# Patient Record
Sex: Female | Born: 1970 | Race: Black or African American | Hispanic: No | Marital: Single | Smoking: Current some day smoker
Health system: Southern US, Community
[De-identification: ages and names within clinical notes are randomized; demographics above are authoritative.]

## PROBLEM LIST (undated history)

## (undated) ENCOUNTER — Emergency Department (HOSPITAL_COMMUNITY): Admission: EM | Payer: BLUE CROSS/BLUE SHIELD | Source: Home / Self Care

## (undated) ENCOUNTER — Ambulatory Visit: Admission: EM | Payer: 59

## (undated) DIAGNOSIS — G5601 Carpal tunnel syndrome, right upper limb: Secondary | ICD-10-CM

## (undated) DIAGNOSIS — IMO0001 Reserved for inherently not codable concepts without codable children: Secondary | ICD-10-CM

## (undated) DIAGNOSIS — F1010 Alcohol abuse, uncomplicated: Secondary | ICD-10-CM

## (undated) DIAGNOSIS — I10 Essential (primary) hypertension: Secondary | ICD-10-CM

## (undated) DIAGNOSIS — F329 Major depressive disorder, single episode, unspecified: Secondary | ICD-10-CM

## (undated) DIAGNOSIS — F32A Depression, unspecified: Secondary | ICD-10-CM

## (undated) DIAGNOSIS — M1990 Unspecified osteoarthritis, unspecified site: Secondary | ICD-10-CM

## (undated) DIAGNOSIS — K219 Gastro-esophageal reflux disease without esophagitis: Secondary | ICD-10-CM

## (undated) DIAGNOSIS — T7840XA Allergy, unspecified, initial encounter: Secondary | ICD-10-CM

## (undated) DIAGNOSIS — F419 Anxiety disorder, unspecified: Secondary | ICD-10-CM

## (undated) HISTORY — PX: FINGER SURGERY: SHX640

## (undated) HISTORY — DX: Unspecified osteoarthritis, unspecified site: M19.90

## (undated) HISTORY — PX: COLONOSCOPY: SHX174

## (undated) HISTORY — PX: HAND TENDON SURGERY: SHX663

## (undated) HISTORY — DX: Allergy, unspecified, initial encounter: T78.40XA

## (undated) HISTORY — PX: UPPER GASTROINTESTINAL ENDOSCOPY: SHX188

---

## 1998-11-28 ENCOUNTER — Emergency Department (HOSPITAL_COMMUNITY): Admission: EM | Admit: 1998-11-28 | Discharge: 1998-11-28 | Payer: Self-pay

## 2001-09-22 ENCOUNTER — Emergency Department (HOSPITAL_COMMUNITY): Admission: EM | Admit: 2001-09-22 | Discharge: 2001-09-22 | Payer: Self-pay | Admitting: Emergency Medicine

## 2001-09-22 ENCOUNTER — Encounter: Payer: Self-pay | Admitting: Emergency Medicine

## 2003-10-05 ENCOUNTER — Emergency Department (HOSPITAL_COMMUNITY): Admission: AD | Admit: 2003-10-05 | Discharge: 2003-10-06 | Payer: Self-pay | Admitting: Emergency Medicine

## 2003-10-06 ENCOUNTER — Emergency Department (HOSPITAL_COMMUNITY): Admission: EM | Admit: 2003-10-06 | Discharge: 2003-10-06 | Payer: Self-pay | Admitting: Emergency Medicine

## 2003-10-11 ENCOUNTER — Inpatient Hospital Stay (HOSPITAL_COMMUNITY): Admission: EM | Admit: 2003-10-11 | Discharge: 2003-10-14 | Payer: Self-pay | Admitting: Emergency Medicine

## 2005-05-17 ENCOUNTER — Emergency Department (HOSPITAL_COMMUNITY): Admission: EM | Admit: 2005-05-17 | Discharge: 2005-05-18 | Payer: Self-pay | Admitting: Emergency Medicine

## 2012-05-13 ENCOUNTER — Emergency Department (HOSPITAL_COMMUNITY)
Admission: EM | Admit: 2012-05-13 | Discharge: 2012-05-13 | Disposition: A | Discharge status: Home / Self Care | Payer: Self-pay | Attending: Emergency Medicine | Admitting: Emergency Medicine

## 2012-05-13 ENCOUNTER — Encounter (HOSPITAL_COMMUNITY): Payer: Self-pay | Admitting: Emergency Medicine

## 2012-05-13 DIAGNOSIS — K029 Dental caries, unspecified: Secondary | ICD-10-CM | POA: Insufficient documentation

## 2012-05-13 DIAGNOSIS — K0889 Other specified disorders of teeth and supporting structures: Secondary | ICD-10-CM

## 2012-05-13 DIAGNOSIS — F17200 Nicotine dependence, unspecified, uncomplicated: Secondary | ICD-10-CM | POA: Insufficient documentation

## 2012-05-13 MED ORDER — IBUPROFEN 400 MG PO TABS
400.0000 mg | ORAL_TABLET | Freq: Once | ORAL | Status: AC
  Administered 2012-05-13: 400 mg via ORAL
  Filled 2012-05-13: qty 1

## 2012-05-13 MED ORDER — NAPROXEN 250 MG PO TABS: 250.0000 mg | ORAL_TABLET | Freq: Two times a day (BID) | ORAL | Status: DC

## 2012-05-13 MED ORDER — OXYCODONE-ACETAMINOPHEN 5-325 MG PO TABS
1.0000 | ORAL_TABLET | Freq: Once | ORAL | Status: AC
  Administered 2012-05-13: 1 via ORAL
  Filled 2012-05-13: qty 1

## 2012-05-13 MED ORDER — OXYCODONE-ACETAMINOPHEN 5-325 MG PO TABS: ORAL_TABLET | ORAL | Status: AC

## 2012-05-13 MED ORDER — PENICILLIN V POTASSIUM 250 MG PO TABS: 250.0000 mg | ORAL_TABLET | Freq: Four times a day (QID) | ORAL | Status: AC

## 2012-05-13 NOTE — ED Provider Notes (Signed)
History     CSN: [id number]  Arrival date & time 05/13/12  1818   First MD Initiated Contact with Patient 05/13/12 1909      Chief Complaint  Patient presents with  . Dental Pain     HPI Pt was seen at 1910.  Per pt, c/o gradual onset and persistence of constant right upper teeth "pain" for the past 2 weeks. Denies fevers, no intra-oral edema, no rash, no facial swelling, no dysphagia, no neck pain.   The condition is aggravated by nothing. The condition is relieved by nothing. The symptoms have been associated with no other complaints. The patient has no significant history of serious medical conditions.     History reviewed. No pertinent past medical history.  History reviewed. No pertinent past surgical history.   History  Substance Use Topics  . Smoking status: Current Everyday Smoker  . Smokeless tobacco: Not on file  . Alcohol Use: Yes    Review of Systems ROS: Statement: All systems negative except as marked or noted in the HPI; Constitutional: Negative for fever and chills. ; ; Eyes: Negative for eye pain and discharge. ; ; ENMT: Positive for dental caries, dental hygiene poor and toothache. Negative for ear pain, bleeding gums, dental injury, facial deformity, facial swelling, hoarseness, nasal congestion, sinus pressure, sore throat, throat swelling and tongue swollen. ; ; Cardiovascular: Negative for chest pain, palpitations, diaphoresis, dyspnea and peripheral edema. ; ; Respiratory: Negative for cough, wheezing and stridor. ; ; Gastrointestinal: Negative for nausea, vomiting, diarrhea and abdominal pain. ; ; Genitourinary: Negative for dysuria, flank pain and hematuria. ; ; Musculoskeletal: Negative for back pain and neck pain. ; ; Skin: Negative for rash and skin lesion. ; ; Neuro: Negative for headache, lightheadedness and neck stiffness. ;    Allergies  Review of patient's allergies indicates no known allergies.  Home Medications  No current outpatient  prescriptions on file.  BP 160/109  Pulse 120  Temp 98.7 F (37.1 C) (Oral)  Resp 18  SpO2 99%  Physical Exam 1915: Physical examination: Vital signs and O2 SAT: Reviewed; Constitutional: Well developed, Well nourished, Well hydrated, In no acute distress; Head and Face: Normocephalic, Atraumatic; Eyes: EOMI, PERRL, No scleral icterus; ENMT: Mouth and pharynx normal, Poor dentition, Widespread dental decay, Left TM normal, Right TM normal, Mucous membranes moist, +upper right 1st premolar and 2nd molar with extensive dental decay.  No gingival erythema, edema, fluctuance, or drainage.  No hoarse voice, no drooling, no stridor.  ; Neck: Supple, Full range of motion, No lymphadenopathy; Cardiovascular: Regular rate and rhythm, No murmur, rub, or gallop; Respiratory: Breath sounds clear & equal bilaterally, No rales, rhonchi, wheezes, or rub, Normal respiratory effort/excursion; Chest: Nontender, Movement normal; Extremities: Pulses normal, No tenderness, No edema; Neuro: AA&Ox3, Major CN grossly intact.  No gross focal motor or sensory deficits in extremities.; Skin: Color normal, No rash, No petechiae, Warm, Dry   ED Course  Procedures    MDM  MDM Reviewed: nursing note and vitals     1915:  Pt encouraged to f/u with dentist or oral surgeon for her dental needs for good continuity of care and definitive treatment.  Verb understanding.         Laray Anger, DO 05/15/12 1245

## 2012-05-13 NOTE — ED Notes (Signed)
Pt c/o right sided dental pain x 5 days

## 2012-06-30 ENCOUNTER — Emergency Department (HOSPITAL_COMMUNITY)
Admission: EM | Admit: 2012-06-30 | Discharge: 2012-06-30 | Disposition: A | Discharge status: Home / Self Care | Payer: Self-pay | Attending: Emergency Medicine | Admitting: Emergency Medicine

## 2012-06-30 ENCOUNTER — Emergency Department (HOSPITAL_COMMUNITY): Payer: Self-pay

## 2012-06-30 ENCOUNTER — Encounter (HOSPITAL_COMMUNITY): Payer: Self-pay | Admitting: *Deleted

## 2012-06-30 DIAGNOSIS — F17200 Nicotine dependence, unspecified, uncomplicated: Secondary | ICD-10-CM | POA: Insufficient documentation

## 2012-06-30 DIAGNOSIS — M79673 Pain in unspecified foot: Secondary | ICD-10-CM

## 2012-06-30 DIAGNOSIS — M79609 Pain in unspecified limb: Secondary | ICD-10-CM | POA: Insufficient documentation

## 2012-06-30 MED ORDER — IBUPROFEN 400 MG PO TABS
600.0000 mg | ORAL_TABLET | Freq: Once | ORAL | Status: AC
  Administered 2012-06-30: 600 mg via ORAL
  Filled 2012-06-30: qty 1

## 2012-06-30 NOTE — ED Notes (Signed)
Pt to radiology.

## 2012-06-30 NOTE — ED Provider Notes (Signed)
History     CSN: [id number]  Arrival date & time 06/30/12  1451   First MD Initiated Contact with Patient 06/30/12 1503      Chief Complaint  Patient presents with  . Foot Pain    (Consider location/radiation/quality/duration/timing/severity/associated sxs/prior treatment) Patient is a 41 y.o. female presenting with lower extremity pain. The history is provided by the patient.  Foot Pain Pertinent negatives include no shortness of breath.  pt c/o pain to dorsal aspect mid right foot on and off x 1 month. Dull, intermittent, non radiating. Occasionally worse w walking and palpation dorsum foot.  No acute or abrupt change today. Denies known injury. Skin intact. No erythema or skin lesions. No discoloration of foot.  No numbness/weakness. No calf pain or claudication. Denies hx arthritis or gout.     History reviewed. No pertinent past medical history.  History reviewed. No pertinent past surgical history.  No family history on file.  History  Substance Use Topics  . Smoking status: Current Every Day Smoker  . Smokeless tobacco: Not on file  . Alcohol Use: Yes    OB History    Grav Para Term Preterm Abortions TAB SAB Ect Mult Living                  Review of Systems  Constitutional: Negative for fever and chills.  Respiratory: Negative for shortness of breath.   Cardiovascular: Negative for leg swelling.  Musculoskeletal: Negative for gait problem.  Skin: Negative for wound.  Neurological: Negative for weakness and numbness.    Allergies  Review of patient's allergies indicates no known allergies.  Home Medications   Current Outpatient Rx  Name Route Sig Dispense Refill  . NAPROXEN 250 MG PO TABS Oral Take 250 mg by mouth 2 (two) times daily with a meal.    . QUETIAPINE FUMARATE 50 MG PO TABS Oral Take 150 mg by mouth at bedtime.      BP 129/100  Pulse 92  Temp 97.5 F (36.4 C) (Oral)  Resp 16  SpO2 99%  LMP 06/29/2012  Physical Exam  Nursing note  and vitals reviewed. Constitutional: She appears well-developed and well-nourished. No distress.  HENT:  Nose: Nose normal.  Mouth/Throat: Oropharynx is clear and moist.  Eyes: Conjunctivae normal are normal. No scleral icterus.  Neck: Neck supple. No tracheal deviation present.  Cardiovascular: Normal rate and intact distal pulses.   Pulmonary/Chest: Effort normal. No respiratory distress.  Abdominal: Normal appearance. She exhibits no distension.  Musculoskeletal: She exhibits no edema.       Mild tenderness dorsum right foot. No sts. No skin changes, lesions or erythema. Normal temperature, no increased warmth. Dp/pt 2+, normal cap refill distally.   Neurological: She is alert.       Motor intact bil. Steady gait.   Skin: Skin is warm and dry. No rash noted.  Psychiatric: She has a normal mood and affect.    ED Course  Procedures (including critical care time)  Labs Reviewed - No data to display Dg Foot Complete Right  06/30/2012  *RADIOLOGY REPORT*  Clinical Data: Twisting injury with dorsal pain.  Difficulty bearing weight.  RIGHT FOOT COMPLETE - 3+ VIEW  Comparison: None.  Findings: No acute osseous or joint abnormality.  IMPRESSION: No acute osseous or joint abnormality.   Original Report Authenticated By: Reyes Ivan, M.D.         MDM  No sign of infection. xrays from triage neg.  Discussed diff dx w  pt. Motrin po.  Will have f/u podiatry.   Reviewed nursing notes and prior charts for additional history.           Suzi Roots, MD 06/30/12 [phone]

## 2012-06-30 NOTE — ED Notes (Signed)
Pt is here for foot pain on the top of her foot which she has had for a month.  Pt does not recall any trauma but states that she drinks and it may have gotten injured without her remembering

## 2012-08-31 ENCOUNTER — Emergency Department (HOSPITAL_COMMUNITY)
Admission: EM | Admit: 2012-08-31 | Discharge: 2012-08-31 | Discharge status: Against Medical Advice | Payer: Managed Care, Other (non HMO) | Attending: Emergency Medicine | Admitting: Emergency Medicine

## 2012-08-31 ENCOUNTER — Emergency Department (HOSPITAL_COMMUNITY)
Admission: EM | Admit: 2012-08-31 | Discharge: 2012-09-01 | Disposition: A | Discharge status: Home / Self Care | Payer: Self-pay | Attending: Emergency Medicine | Admitting: Emergency Medicine

## 2012-08-31 ENCOUNTER — Encounter (HOSPITAL_COMMUNITY): Payer: Self-pay | Admitting: Emergency Medicine

## 2012-08-31 DIAGNOSIS — F17200 Nicotine dependence, unspecified, uncomplicated: Secondary | ICD-10-CM | POA: Insufficient documentation

## 2012-08-31 DIAGNOSIS — F1010 Alcohol abuse, uncomplicated: Secondary | ICD-10-CM | POA: Insufficient documentation

## 2012-08-31 DIAGNOSIS — F10929 Alcohol use, unspecified with intoxication, unspecified: Secondary | ICD-10-CM

## 2012-08-31 LAB — CBC WITH DIFFERENTIAL/PLATELET
Eosinophils Relative: 0 % (ref 0–5)
HCT: 36.2 % (ref 36.0–46.0)
Lymphocytes Relative: 14 % (ref 12–46)
Lymphs Abs: 1.1 10*3/uL (ref 0.7–4.0)
MCV: 89.4 fL (ref 78.0–100.0)
Monocytes Absolute: 0.2 10*3/uL (ref 0.1–1.0)
Neutro Abs: 6.6 10*3/uL (ref 1.7–7.7)
RBC: 4.05 MIL/uL (ref 3.87–5.11)
WBC: 7.9 10*3/uL (ref 4.0–10.5)

## 2012-08-31 LAB — BASIC METABOLIC PANEL
CO2: 23 mEq/L (ref 19–32)
Chloride: 105 mEq/L (ref 96–112)
Creatinine, Ser: 0.75 mg/dL (ref 0.50–1.10)
Potassium: 3.7 mEq/L (ref 3.5–5.1)

## 2012-08-31 LAB — ETHANOL: Alcohol, Ethyl (B): 224 mg/dL — ABNORMAL HIGH (ref 0–11)

## 2012-08-31 NOTE — ED Notes (Signed)
[id number]<AV> Expected date:<BR> Expected time:<BR> Means of arrival:<BR> Comments:<BR> Medic 261, 41 F, ETOH

## 2012-08-31 NOTE — ED Notes (Signed)
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

## 2012-08-31 NOTE — ED Notes (Signed)
Patient states she was laying in the rain drinking. Patient cold to touch. Rectal temp 95.7. Warm blankets applied. Patient statesshe had 3 beers and 1/2 pint liquor today. Patient states she was recently tested for HIV and Syphilis, both were negative. LMP @ 3weeks ago. 20g to L AC, SL.

## 2012-08-31 NOTE — ED Notes (Signed)
Patient arrived via EMS at 26. Triage assessment completed at 2002 by [facility], RN. Patient had given in accuarte birthday information on arrival.

## 2012-08-31 NOTE — ED Notes (Signed)
Per EMS. Patient laying on sidewalk for at least 1 hour, witnesses saw her laying there and when EMS arrived it had been greater than 1 hour. When asked about amount ingested she says "alot". Patient denied any pain on ride here, upon arrival patient states her heart hurts. Patient denies any medical history.

## 2012-08-31 NOTE — ED Notes (Addendum)
Per EMS. Patient reported to be laying in the rain greater than 1 hour, according to witnesses on scene. When questioned patient states shes' been drinking "alot" or "not enough". 20g L AC SL. Denies pain and any medical history.

## 2012-08-31 NOTE — ED Provider Notes (Signed)
History     CSN: [id number]  Arrival date & time 08/31/12  2124   First MD Initiated Contact with Patient 08/31/12 2158      Chief Complaint  Patient presents with  . Alcohol Intoxication   level V caveat applies secondary to alcohol intoxication  HPI  History provided by patient and EMS report. Patient is a 41 year old female with no known significant PMH who presents by EMS with concerns for alcohol intoxication. Patient is a poor historian. Per EMS patient was picked up lying on the sidewalk apparently intoxicated. Patient has been laying in the rain and was very wet. Patient had reported drinking a large amount of alcohol today. No complaints of injury. No physical signs of injury reported.   History reviewed. No pertinent past medical history.  History reviewed. No pertinent past surgical history.  History reviewed. No pertinent family history.  History  Substance Use Topics  . Smoking status: Current Every Day Smoker  . Smokeless tobacco: Not on file  . Alcohol Use: Yes     Comment: patient unableto quantify    OB History    Grav Para Term Preterm Abortions TAB SAB Ect Mult Living                  Review of Systems  Unable to perform ROS: Other    Allergies  Review of patient's allergies indicates no known allergies.  Home Medications   Current Outpatient Rx  Name  Route  Sig  Dispense  Refill  . NAPROXEN 250 MG PO TABS   Oral   Take 250 mg by mouth 2 (two) times daily with a meal.         . QUETIAPINE FUMARATE 50 MG PO TABS   Oral   Take 150 mg by mouth at bedtime.           BP 130/73  Pulse 85  Temp 95.7 F (35.4 C) (Rectal)  Resp 12  SpO2 90%  LMP 08/10/2012  Physical Exam  Nursing note and vitals reviewed. Constitutional: She appears well-developed and well-nourished. No distress.  HENT:  Head: Normocephalic and atraumatic.  Neck: Normal range of motion. Neck supple.  Cardiovascular: Normal rate and regular rhythm.    Pulmonary/Chest: Effort normal and breath sounds normal. No respiratory distress. She has no wheezes. She has no rales.  Abdominal: Soft. There is no tenderness.  Neurological:       Patient arouses mildly to voice command partial eye-opening. She slurs her speech but answers questions.  Skin: Skin is warm and dry.    ED Course  Procedures   Results for orders placed during the hospital encounter of 08/31/12  CBC WITH DIFFERENTIAL      Component Value Range   WBC 7.9  4.0 - 10.5 K/uL   RBC 4.05  3.87 - 5.11 MIL/uL   Hemoglobin 12.4  12.0 - 15.0 g/dL   HCT 40.9  81.1 - 91.4 %   MCV 89.4  78.0 - 100.0 fL   MCH 30.6  26.0 - 34.0 pg   MCHC 34.3  30.0 - 36.0 g/dL   RDW 78.2  95.6 - 21.3 %   Platelets 295  150 - 400 K/uL   Neutrophils Relative 83 (*) 43 - 77 %   Neutro Abs 6.6  1.7 - 7.7 K/uL   Lymphocytes Relative 14  12 - 46 %   Lymphs Abs 1.1  0.7 - 4.0 K/uL   Monocytes Relative 2 (*) 3 - 12 %  Monocytes Absolute 0.2  0.1 - 1.0 K/uL   Eosinophils Relative 0  0 - 5 %   Eosinophils Absolute 0.0  0.0 - 0.7 K/uL   Basophils Relative 0  0 - 1 %   Basophils Absolute 0.0  0.0 - 0.1 K/uL  ETHANOL      Component Value Range   Alcohol, Ethyl (B) 224 (*) 0 - 11 mg/dL  BASIC METABOLIC PANEL      Component Value Range   Sodium 140  135 - 145 mEq/L   Potassium 3.7  3.5 - 5.1 mEq/L   Chloride 105  96 - 112 mEq/L   CO2 23  19 - 32 mEq/L   Glucose, Bld 113 (*) 70 - 99 mg/dL   BUN 8  6 - 23 mg/dL   Creatinine, Ser 0.45  0.50 - 1.10 mg/dL   Calcium 9.1  8.4 - 40.9 mg/dL   GFR calc non Af Amer >90  >90 mL/min   GFR calc Af Amer >90  >90 mL/min        1. Alcohol intoxication       MDM  8:20 PM patient seen and evaluated. Patient sleeping does arouse to voice command slightly. Patient smells of alcohol. She does answer questions and states she has been drinking. Patient denies any other complaints aside from complaining of being cold.   Patient has been observed in the  emergency department. Labs demonstrated elevated alcohol level. No other concerning findings. Patient has been resting comfortably. Temperature improved with warming measures. Patient has since been much more awake. She has been asking for food and drinks. Patient also requesting to return home. Patient denies any other additional complaints.     Angus Seller, [place] 09/01/12 [phone]

## 2012-08-31 NOTE — ED Notes (Signed)
Patient intoxicated, difficult to obtain history. Patient states she was laying in the rain, drinking, patient states she had 3 beers and half a pint of liquor. Patient cooperative. Patient rectal temp 95.44F, multiple warm blankets applied. Patient states LMP to be 3 weeks ago, says she recently was tested for HIV and syphilis and was negative. 20g to L AC SL, placed by EMS.

## 2012-08-31 NOTE — ED Notes (Signed)
Patient demographic information entered incorrectly due to patient giving inaccurate information. Please disreguard information entered into this chart for 08/31/12 visit.

## 2012-09-01 NOTE — ED Notes (Signed)
Patient states she feels "weird" non specific, reports headache 8/10. Patient states she drinks a lot and does not typically have a hangover. Patient states she does not remember anything from last night except people waking her up and telling her that she had been laying there for an hour, and that she felt cold. VSS at this time.

## 2012-09-01 NOTE — ED Notes (Signed)
Patient awake, talking on the phone. Patient appears more oriented at this time.

## 2012-09-03 NOTE — ED Provider Notes (Signed)
Medical screening examination/treatment/procedure(s) were performed by non-physician practitioner and as supervising physician I was immediately available for consultation/collaboration.   Tatem Holsonback H Gayatri Teasdale, MD 09/03/12 0650 

## 2015-11-10 ENCOUNTER — Encounter (HOSPITAL_COMMUNITY): Payer: Self-pay | Admitting: Emergency Medicine

## 2015-11-10 ENCOUNTER — Emergency Department (HOSPITAL_COMMUNITY)
Admission: EM | Admit: 2015-11-10 | Discharge: 2015-11-10 | Disposition: A | Discharge status: Home / Self Care | Payer: BLUE CROSS/BLUE SHIELD | Attending: Emergency Medicine | Admitting: Emergency Medicine

## 2015-11-10 DIAGNOSIS — J40 Bronchitis, not specified as acute or chronic: Secondary | ICD-10-CM

## 2015-11-10 DIAGNOSIS — J069 Acute upper respiratory infection, unspecified: Secondary | ICD-10-CM | POA: Insufficient documentation

## 2015-11-10 DIAGNOSIS — J209 Acute bronchitis, unspecified: Secondary | ICD-10-CM | POA: Insufficient documentation

## 2015-11-10 DIAGNOSIS — F17210 Nicotine dependence, cigarettes, uncomplicated: Secondary | ICD-10-CM | POA: Insufficient documentation

## 2015-11-10 DIAGNOSIS — R52 Pain, unspecified: Secondary | ICD-10-CM | POA: Insufficient documentation

## 2015-11-10 DIAGNOSIS — R05 Cough: Secondary | ICD-10-CM | POA: Diagnosis present

## 2015-11-10 MED ORDER — ALBUTEROL SULFATE HFA 108 (90 BASE) MCG/ACT IN AERS
2.0000 | INHALATION_SPRAY | Freq: Once | RESPIRATORY_TRACT | Status: AC
  Administered 2015-11-10: 2 via RESPIRATORY_TRACT
  Filled 2015-11-10: qty 6.7

## 2015-11-10 MED ORDER — IBUPROFEN 400 MG PO TABS
800.0000 mg | ORAL_TABLET | Freq: Once | ORAL | Status: AC
  Administered 2015-11-10: 800 mg via ORAL
  Filled 2015-11-10: qty 4

## 2015-11-10 MED ORDER — PREDNISONE 20 MG PO TABS
60.0000 mg | ORAL_TABLET | Freq: Once | ORAL | Status: AC
  Administered 2015-11-10: 60 mg via ORAL
  Filled 2015-11-10: qty 3

## 2015-11-10 MED ORDER — IPRATROPIUM-ALBUTEROL 0.5-2.5 (3) MG/3ML IN SOLN
3.0000 mL | Freq: Once | RESPIRATORY_TRACT | Status: AC
  Administered 2015-11-10: 3 mL via RESPIRATORY_TRACT
  Filled 2015-11-10: qty 3

## 2015-11-10 MED ORDER — GUAIFENESIN-CODEINE 100-10 MG/5ML PO SOLN: 5.0000 mL | Freq: Three times a day (TID) | ORAL | Status: DC | PRN

## 2015-11-10 MED ORDER — BENZONATATE 100 MG PO CAPS
200.0000 mg | ORAL_CAPSULE | Freq: Once | ORAL | Status: AC
  Administered 2015-11-10: 200 mg via ORAL
  Filled 2015-11-10: qty 2

## 2015-11-10 MED ORDER — ALBUTEROL SULFATE HFA 108 (90 BASE) MCG/ACT IN AERS: 1.0000 | INHALATION_SPRAY | Freq: Four times a day (QID) | RESPIRATORY_TRACT | Status: DC | PRN

## 2015-11-10 MED ORDER — PREDNISONE 20 MG PO TABS: 40.0000 mg | ORAL_TABLET | Freq: Every day | ORAL | Status: DC

## 2015-11-10 MED ORDER — AZITHROMYCIN 250 MG PO TABS: 250.0000 mg | ORAL_TABLET | Freq: Every day | ORAL | Status: DC

## 2015-11-10 NOTE — Discharge Instructions (Signed)
Acute Bronchitis  Bronchitis is inflammation of the airways that extend from the windpipe into the lungs (bronchi). The inflammation often causes mucus to develop. This leads to a cough, which is the most common symptom of bronchitis.  In acute bronchitis, the condition usually develops suddenly and goes away over time, usually in a couple weeks. Smoking, allergies, and asthma can make bronchitis worse. Repeated episodes of bronchitis may cause further lung problems.  CAUSES Acute bronchitis is most often caused by the same virus that causes a cold. The virus can spread from person to person (contagious) through coughing, sneezing, and touching contaminated objects. SIGNS AND SYMPTOMS   Cough.   Fever.   Coughing up mucus.   Body aches.   Chest congestion.   Chills.   Shortness of breath.   Sore throat.  DIAGNOSIS  Acute bronchitis is usually diagnosed through a physical exam. Your health care provider will also ask you questions about your medical history. Tests, such as chest X-rays, are sometimes done to rule out other conditions.  TREATMENT  Acute bronchitis usually goes away in a couple weeks. Oftentimes, no medical treatment is necessary. Medicines are sometimes given for relief of fever or cough. Antibiotic medicines are usually not needed but may be prescribed in certain situations. In some cases, an inhaler may be recommended to help reduce shortness of breath and control the cough. A cool mist vaporizer may also be used to help thin bronchial secretions and make it easier to clear the chest.  HOME CARE INSTRUCTIONS  Get plenty of rest.   Drink enough fluids to keep your urine clear or pale yellow (unless you have a medical condition that requires fluid restriction). Increasing fluids may help thin your respiratory secretions (sputum) and reduce chest congestion, and it will prevent dehydration.   Take medicines only as directed by your health care provider.  If  you were prescribed an antibiotic medicine, finish it all even if you start to feel better.  Avoid smoking and secondhand smoke. Exposure to cigarette smoke or irritating chemicals will make bronchitis worse. If you are a smoker, consider using nicotine gum or skin patches to help control withdrawal symptoms. Quitting smoking will help your lungs heal faster.   Reduce the chances of another bout of acute bronchitis by washing your hands frequently, avoiding people with cold symptoms, and trying not to touch your hands to your mouth, nose, or eyes.   Keep all follow-up visits as directed by your health care provider.  SEEK MEDICAL CARE IF: Your symptoms do not improve after 1 week of treatment.  SEEK IMMEDIATE MEDICAL CARE IF:  You develop an increased fever or chills.   You have chest pain.   You have severe shortness of breath.  You have bloody sputum.   You develop dehydration.  You faint or repeatedly feel like you are going to pass out.  You develop repeated vomiting.  You develop a severe headache. MAKE SURE YOU:   Understand these instructions.  Will watch your condition.  Will get help right away if you are not doing well or get worse.   This information is not intended to replace advice given to you by your health care provider. Make sure you discuss any questions you have with your health care provider.   Document Released: 10/04/2004 Document Revised: 09/17/2014 Document Reviewed: 02/17/2013 Elsevier Interactive Patient Education 2016 [employer].   How to Use an Inhaler Proper inhaler technique is very important. Good technique ensures that the  medicine reaches the lungs. Poor technique results in depositing the medicine on the tongue and back of the throat rather than in the airways. If you do not use the inhaler with good technique, the medicine will not help you. STEPS TO FOLLOW IF USING AN INHALER WITHOUT AN EXTENSION TUBE  Remove the cap from the  inhaler.  If you are using the inhaler for the first time, you will need to prime it. Shake the inhaler for 5 seconds and release four puffs into the air, away from your face. Ask your health care provider or pharmacist if you have questions about priming your inhaler.  Shake the inhaler for 5 seconds before each breath in (inhalation).  Position the inhaler so that the top of the canister faces up.  Put your index finger on the top of the medicine canister. Your thumb supports the bottom of the inhaler.  Open your mouth.  Either place the inhaler between your teeth and place your lips tightly around the mouthpiece, or hold the inhaler 1-2 inches away from your open mouth. If you are unsure of which technique to use, ask your health care provider.  Breathe out (exhale) normally and as completely as possible.  Press the canister down with your index finger to release the medicine.  At the same time as the canister is pressed, inhale deeply and slowly until your lungs are completely filled. This should take 4-6 seconds. Keep your tongue down.  Hold the medicine in your lungs for 5-10 seconds (10 seconds is best). This helps the medicine get into the small airways of your lungs.  Breathe out slowly, through pursed lips. Whistling is an example of pursed lips.  Wait at least 15-30 seconds between puffs. Continue with the above steps until you have taken the number of puffs your health care provider has ordered. Do not use the inhaler more than your health care provider tells you.  Replace the cap on the inhaler.  Follow the directions from your health care provider or the inhaler insert for cleaning the inhaler. STEPS TO FOLLOW IF USING AN INHALER WITH AN EXTENSION (SPACER)  Remove the cap from the inhaler.  If you are using the inhaler for the first time, you will need to prime it. Shake the inhaler for 5 seconds and release four puffs into the air, away from your face. Ask your health  care provider or pharmacist if you have questions about priming your inhaler.  Shake the inhaler for 5 seconds before each breath in (inhalation).  Place the open end of the spacer onto the mouthpiece of the inhaler.  Position the inhaler so that the top of the canister faces up and the spacer mouthpiece faces you.  Put your index finger on the top of the medicine canister. Your thumb supports the bottom of the inhaler and the spacer.  Breathe out (exhale) normally and as completely as possible.  Immediately after exhaling, place the spacer between your teeth and into your mouth. Close your lips tightly around the spacer.  Press the canister down with your index finger to release the medicine.  At the same time as the canister is pressed, inhale deeply and slowly until your lungs are completely filled. This should take 4-6 seconds. Keep your tongue down and out of the way.  Hold the medicine in your lungs for 5-10 seconds (10 seconds is best). This helps the medicine get into the small airways of your lungs. Exhale.  Repeat inhaling deeply through  the spacer mouthpiece. Again hold that breath for up to 10 seconds (10 seconds is best). Exhale slowly. If it is difficult to take this second deep breath through the spacer, breathe normally several times through the spacer. Remove the spacer from your mouth.  Wait at least 15-30 seconds between puffs. Continue with the above steps until you have taken the number of puffs your health care provider has ordered. Do not use the inhaler more than your health care provider tells you.  Remove the spacer from the inhaler, and place the cap on the inhaler.  Follow the directions from your health care provider or the inhaler insert for cleaning the inhaler and spacer. If you are using different kinds of inhalers, use your quick relief medicine to open the airways 10-15 minutes before using a steroid if instructed to do so by your health care provider. If  you are unsure which inhalers to use and the order of using them, ask your health care provider, nurse, or respiratory therapist. If you are using a steroid inhaler, always rinse your mouth with water after your last puff, then gargle and spit out the water. Do not swallow the water. AVOID:  Inhaling before or after starting the spray of medicine. It takes practice to coordinate your breathing with triggering the spray.  Inhaling through the nose (rather than the mouth) when triggering the spray. HOW TO DETERMINE IF YOUR INHALER IS FULL OR NEARLY EMPTY You cannot know when an inhaler is empty by shaking it. A few inhalers are now being made with dose counters. Ask your health care provider for a prescription that has a dose counter if you feel you need that extra help. If your inhaler does not have a counter, ask your health care provider to help you determine the date you need to refill your inhaler. Write the refill date on a calendar or your inhaler canister. Refill your inhaler 7-10 days before it runs out. Be sure to keep an adequate supply of medicine. This includes making sure it is not expired, and that you have a spare inhaler.  SEEK MEDICAL CARE IF:   Your symptoms are only partially relieved with your inhaler.  You are having trouble using your inhaler.  You have some increase in phlegm. SEEK IMMEDIATE MEDICAL CARE IF:   You feel little or no relief with your inhalers. You are still wheezing and are feeling shortness of breath or tightness in your chest or both.  You have dizziness, headaches, or a fast heart rate.  You have chills, fever, or night sweats.  You have a noticeable increase in phlegm production, or there is blood in the phlegm. MAKE SURE YOU:   Understand these instructions.  Will watch your condition.  Will get help right away if you are not doing well or get worse.   This information is not intended to replace advice given to you by your health care  provider. Make sure you discuss any questions you have with your health care provider.   Document Released: 08/24/2000 Document Revised: 06/17/2013 Document Reviewed: 03/26/2013 Elsevier Interactive Patient Education 2016 [employer] Inc[employer]  Viral Infections A viral infection can be caused by different types of viruses.Most viral infections are not serious and resolve on their own. However, some infections may cause severe symptoms and may lead to further complications. SYMPTOMS Viruses can frequently cause:  Minor sore throat.  Aches and pains.  Headaches.  Runny nose.  Different types of rashes.  Watery eyes.  Tiredness.  Cough.  Loss of appetite.  Gastrointestinal infections, resulting in nausea, vomiting, and diarrhea. These symptoms do not respond to antibiotics because the infection is not caused by bacteria. However, you might catch a bacterial infection following the viral infection. This is sometimes called a "superinfection." Symptoms of such a bacterial infection may include:  Worsening sore throat with pus and difficulty swallowing.  Swollen neck glands.  Chills and a high or persistent fever.  Severe headache.  Tenderness over the sinuses.  Persistent overall ill feeling (malaise), muscle aches, and tiredness (fatigue).  Persistent cough.  Yellow, green, or brown mucus production with coughing. HOME CARE INSTRUCTIONS   Only take over-the-counter or prescription medicines for pain, discomfort, diarrhea, or fever as directed by your caregiver.  Drink enough water and fluids to keep your urine clear or pale yellow. Sports drinks can provide valuable electrolytes, sugars, and hydration.  Get plenty of rest and maintain proper nutrition. Soups and broths with crackers or rice are fine. SEEK IMMEDIATE MEDICAL CARE IF:   You have severe headaches, shortness of breath, chest pain, neck pain, or an unusual rash.  You have uncontrolled vomiting, diarrhea, or  you are unable to keep down fluids.  You or your child has an oral temperature above 102 F (38.9 C), not controlled by medicine.  Your baby is older than 3 months with a rectal temperature of 102 F (38.9 C) or higher.  Your baby is 58 months old or younger with a rectal temperature of 100.4 F (38 C) or higher. MAKE SURE YOU:   Understand these instructions.  Will watch your condition.  Will get help right away if you are not doing well or get worse.   This information is not intended to replace advice given to you by your health care provider. Make sure you discuss any questions you have with your health care provider.   Document Released: 06/06/2005 Document Revised: 11/19/2011 Document Reviewed: 02/02/2015 Elsevier Interactive Patient Education [employer]

## 2015-11-10 NOTE — ED Notes (Signed)
Declined W/C at D/C and was escorted to lobby by RN. 

## 2015-11-10 NOTE — ED Provider Notes (Signed)
CSN: [id number]     Arrival date & time 11/10/15  1412 History   First MD Initiated Contact with Patient 11/10/15 1458     Chief Complaint  Patient presents with  . Generalized Body Aches  . Cough     (Consider location/radiation/quality/duration/timing/severity/associated sxs/prior Treatment) HPI   Pt is a 45 y/o female, current smoker, who presents to the ED for evaluation of 2 weeks of persistent URI sx with productive cough and intermittent body aches.  She has tried alkaselzter cold plus, tylenol, ibuprofen, nyquil/dayquil, and robitussin without any improvement.  She rates her body ache pain 9/10 and explains that it is rapidly worsening, even increasing from 7/10 when she first got to the ER.  She states she just "feels bad."  Pt describes sputum as yellow to green, no hematemesis.  She denies hemoptysis, SOB, wheeze, CP, abdominal pain, N, V, fevers, chills, sweats, HA.  Multiple family members have been sick recently with colds, coughs, body aches and suspected flu.  She had a fever two weeks ago, but none recently.  No other acute complaints.  History reviewed. No pertinent past medical history. History reviewed. No pertinent past surgical history. No family history on file. Social History  Substance Use Topics  . Smoking status: Current Every Day Smoker -- 0.50 packs/day    Types: Cigarettes  . Smokeless tobacco: None  . Alcohol Use: Yes     Comment: daily   OB History    No data available     Review of Systems  All other systems reviewed and are negative.     Allergies  Review of patient's allergies indicates no known allergies.  Home Medications   Prior to Admission medications   Medication Sig Start Date End Date Taking? Authorizing Provider  albuterol (PROVENTIL HFA;VENTOLIN HFA) 108 (90 Base) MCG/ACT inhaler Inhale 1-2 puffs into the lungs every 6 (six) hours as needed for wheezing or shortness of breath. 11/10/15   Danelle Berry, PA-C  azithromycin (ZITHROMAX)  250 MG tablet Take 1 tablet (250 mg total) by mouth daily. Take first 2 tablets together, then 1 every day until finished. 11/10/15   Danelle Berry, PA-C  guaiFENesin-codeine 100-10 MG/5ML syrup Take 5 mLs by mouth 3 (three) times daily as needed for cough. 11/10/15   Danelle Berry, PA-C  predniSONE (DELTASONE) 20 MG tablet Take 2 tablets (40 mg total) by mouth daily. 11/10/15   Danelle Berry, PA-C   BP 162/109 mmHg  Pulse 94  Temp(Src) 97.6 F (36.4 C) (Oral)  Resp 16  Ht  (1.575 m)  Wt 70.761 kg  BMI 28.53 kg/m2  SpO2 99%  LMP 10/13/2015 Physical Exam  Constitutional: She is oriented to person, place, and time. She appears well-developed and well-nourished. No distress.  HENT:  Head: Normocephalic and atraumatic.  Nose: Nose normal.  Mouth/Throat: Oropharynx is clear and moist. No oropharyngeal exudate.  Eyes: Conjunctivae and EOM are normal. Pupils are equal, round, and reactive to light. Right eye exhibits no discharge. Left eye exhibits no discharge. No scleral icterus.  Neck: Normal range of motion. No JVD present. No tracheal deviation present. No thyromegaly present.  Cardiovascular: Normal rate, regular rhythm, normal heart sounds and intact distal pulses.  Exam reveals no gallop and no friction rub.   No murmur heard. Pulmonary/Chest: Effort normal and breath sounds normal. No respiratory distress. She has no wheezes. She has no rales. She exhibits no tenderness.  Abdominal: Soft. Bowel sounds are normal. She exhibits no distension and no  mass. There is no tenderness. There is no rebound and no guarding.  Musculoskeletal: Normal range of motion. She exhibits no edema or tenderness.  Lymphadenopathy:    She has no cervical adenopathy.  Neurological: She is alert and oriented to person, place, and time. She has normal reflexes. No cranial nerve deficit. She exhibits normal muscle tone. Coordination normal.  Skin: Skin is warm and dry. No rash noted. She is not diaphoretic. No erythema.  No pallor.  Psychiatric: She has a normal mood and affect. Her behavior is normal. Judgment and thought content normal.  Nursing note and vitals reviewed.   ED Course  Procedures (including critical care time) Labs Review Labs Reviewed - No data to display  Imaging Review No results found. I have personally reviewed and evaluated these images and lab results as part of my medical decision-making.   EKG Interpretation None      MDM   Pt with URI sx and cough for 2 weeks.  Significant smoking hx. Will treat for bronchitis with zPak, steroids, cough syrup and breathing tx.  Feel pt is safe to discharge home, no respiratory distress, normal oxygenation.  Pt discharged in good condition, mildly elevated BP, but asymptomatic.  She was encouraged to follow up with PCP.  Final diagnoses:  Bronchitis  URI (upper respiratory infection)  Body aches      Danelle Berry, PA-C 11/19/15 2319  Pricilla Loveless, MD 11/26/15 [phone]

## 2015-11-10 NOTE — ED Notes (Signed)
Pt states for the last 2 weeks she had been having a productive cough with nasal congestion and body aches. Pt tried multiple OTC cold meds with no reilef.

## 2016-01-27 ENCOUNTER — Other Ambulatory Visit: Payer: Self-pay | Admitting: Orthopedic Surgery

## 2016-01-30 ENCOUNTER — Encounter (HOSPITAL_COMMUNITY): Payer: Self-pay | Admitting: *Deleted

## 2016-01-30 MED ORDER — CEFAZOLIN SODIUM-DEXTROSE 2-4 GM/100ML-% IV SOLN
2.0000 g | INTRAVENOUS | Status: AC
  Administered 2016-01-31: 2 g via INTRAVENOUS
  Filled 2016-01-30: qty 100

## 2016-01-30 NOTE — Progress Notes (Signed)
Pt denies any acute cardiopulmonary issues. Pt denies having a stress test, echo and cardiac cath. Pt denies having an EKG and chest x ray within the last year. Pt made aware to stop  taking Aspirin, vitamins, fish oil and herbal medications. Do not take any NSAIDs ie: Ibuprofen, Advil, Naproxen, BC and Goody Powder or any medication containing Aspirin. Pt verbalized understanding of all pre-op instructions.

## 2016-01-31 ENCOUNTER — Ambulatory Visit (HOSPITAL_COMMUNITY)
Admission: RE | Admit: 2016-01-31 | Discharge: 2016-01-31 | Disposition: A | Discharge status: Home / Self Care | Payer: BLUE CROSS/BLUE SHIELD | Source: Ambulatory Visit | Attending: Orthopedic Surgery | Admitting: Orthopedic Surgery

## 2016-01-31 ENCOUNTER — Ambulatory Visit (HOSPITAL_COMMUNITY): Payer: BLUE CROSS/BLUE SHIELD | Admitting: Critical Care Medicine

## 2016-01-31 ENCOUNTER — Encounter (HOSPITAL_COMMUNITY): Payer: Self-pay | Admitting: Critical Care Medicine

## 2016-01-31 ENCOUNTER — Encounter (HOSPITAL_COMMUNITY)
Admission: RE | Disposition: A | Discharge status: Home / Self Care | Payer: Self-pay | Source: Ambulatory Visit | Attending: Orthopedic Surgery

## 2016-01-31 DIAGNOSIS — G5601 Carpal tunnel syndrome, right upper limb: Secondary | ICD-10-CM | POA: Insufficient documentation

## 2016-01-31 DIAGNOSIS — Z79899 Other long term (current) drug therapy: Secondary | ICD-10-CM | POA: Diagnosis not present

## 2016-01-31 DIAGNOSIS — F17210 Nicotine dependence, cigarettes, uncomplicated: Secondary | ICD-10-CM | POA: Diagnosis not present

## 2016-01-31 HISTORY — DX: Carpal tunnel syndrome, right upper limb: G56.01

## 2016-01-31 HISTORY — PX: CARPAL TUNNEL RELEASE: SHX101

## 2016-01-31 HISTORY — DX: Reserved for inherently not codable concepts without codable children: IMO0001

## 2016-01-31 LAB — CBC
HCT: 41.6 % (ref 36.0–46.0)
HEMOGLOBIN: 13.6 g/dL (ref 12.0–15.0)
MCH: 29.1 pg (ref 26.0–34.0)
MCHC: 32.7 g/dL (ref 30.0–36.0)
MCV: 88.9 fL (ref 78.0–100.0)
PLATELETS: 325 10*3/uL (ref 150–400)
RBC: 4.68 MIL/uL (ref 3.87–5.11)
RDW: 12.7 % (ref 11.5–15.5)
WBC: 4.8 10*3/uL (ref 4.0–10.5)

## 2016-01-31 SURGERY — CARPAL TUNNEL RELEASE
Anesthesia: Regional | Site: Wrist | Laterality: Right

## 2016-01-31 MED ORDER — FENTANYL CITRATE (PF) 100 MCG/2ML IJ SOLN
INTRAMUSCULAR | Status: DC | PRN
  Administered 2016-01-31 (×2): 25 ug via INTRAVENOUS
  Administered 2016-01-31: 50 ug via INTRAVENOUS

## 2016-01-31 MED ORDER — PROPOFOL 10 MG/ML IV BOLUS
INTRAVENOUS | Status: AC
  Filled 2016-01-31: qty 20

## 2016-01-31 MED ORDER — BUPIVACAINE HCL (PF) 0.25 % IJ SOLN
INTRAMUSCULAR | Status: AC
  Filled 2016-01-31: qty 30

## 2016-01-31 MED ORDER — CHLORHEXIDINE GLUCONATE 4 % EX LIQD
60.0000 mL | Freq: Once | CUTANEOUS | Status: DC
Start: 2016-01-31 — End: 2016-01-31

## 2016-01-31 MED ORDER — MIDAZOLAM HCL 5 MG/5ML IJ SOLN
INTRAMUSCULAR | Status: DC | PRN
  Administered 2016-01-31: 2 mg via INTRAVENOUS

## 2016-01-31 MED ORDER — ONDANSETRON HCL 4 MG/2ML IJ SOLN
INTRAMUSCULAR | Status: DC | PRN
  Administered 2016-01-31: 4 mg via INTRAVENOUS

## 2016-01-31 MED ORDER — LACTATED RINGERS IV SOLN
INTRAVENOUS | Status: DC
  Administered 2016-01-31: 12:00:00 via INTRAVENOUS

## 2016-01-31 MED ORDER — FENTANYL CITRATE (PF) 250 MCG/5ML IJ SOLN
INTRAMUSCULAR | Status: AC
  Filled 2016-01-31: qty 5

## 2016-01-31 MED ORDER — CHLORHEXIDINE GLUCONATE 4 % EX LIQD: 60.0000 mL | Freq: Once | CUTANEOUS | Status: DC

## 2016-01-31 MED ORDER — DEXAMETHASONE SODIUM PHOSPHATE 10 MG/ML IJ SOLN
INTRAMUSCULAR | Status: DC | PRN
  Administered 2016-01-31: 10 mg via INTRAVENOUS

## 2016-01-31 MED ORDER — DEXAMETHASONE SODIUM PHOSPHATE 10 MG/ML IJ SOLN
INTRAMUSCULAR | Status: AC
  Filled 2016-01-31: qty 1

## 2016-01-31 MED ORDER — PROPOFOL 10 MG/ML IV BOLUS
INTRAVENOUS | Status: DC | PRN
  Administered 2016-01-31: 150 mg via INTRAVENOUS
  Administered 2016-01-31: 20 mg via INTRAVENOUS

## 2016-01-31 MED ORDER — BUPIVACAINE HCL (PF) 0.25 % IJ SOLN
INTRAMUSCULAR | Status: DC | PRN
  Administered 2016-01-31: 10 mL

## 2016-01-31 MED ORDER — EPHEDRINE SULFATE 50 MG/ML IJ SOLN
INTRAMUSCULAR | Status: DC | PRN
  Administered 2016-01-31: 5 mg via INTRAVENOUS

## 2016-01-31 MED ORDER — 0.9 % SODIUM CHLORIDE (POUR BTL) OPTIME
TOPICAL | Status: DC | PRN
  Administered 2016-01-31: 1000 mL

## 2016-01-31 MED ORDER — MIDAZOLAM HCL 2 MG/2ML IJ SOLN
INTRAMUSCULAR | Status: AC
  Filled 2016-01-31: qty 2

## 2016-01-31 MED ORDER — LIDOCAINE HCL (CARDIAC) 20 MG/ML IV SOLN
INTRAVENOUS | Status: DC | PRN
  Administered 2016-01-31: 100 mg via INTRAVENOUS

## 2016-01-31 MED ORDER — ONDANSETRON HCL 4 MG/2ML IJ SOLN
INTRAMUSCULAR | Status: AC
  Filled 2016-01-31: qty 2

## 2016-01-31 SURGICAL SUPPLY — 43 items
BANDAGE ELASTIC 3 VELCRO ST LF (GAUZE/BANDAGES/DRESSINGS) ×4 IMPLANT
BANDAGE ELASTIC 4 VELCRO ST LF (GAUZE/BANDAGES/DRESSINGS) ×2 IMPLANT
BNDG ESMARK 4X9 LF (GAUZE/BANDAGES/DRESSINGS) IMPLANT
BNDG GAUZE ELAST 4 BULKY (GAUZE/BANDAGES/DRESSINGS) ×2 IMPLANT
CORDS BIPOLAR (ELECTRODE) ×2 IMPLANT
COVER SURGICAL LIGHT HANDLE (MISCELLANEOUS) ×2 IMPLANT
CUFF TOURNIQUET SINGLE 18IN (TOURNIQUET CUFF) ×2 IMPLANT
CUFF TOURNIQUET SINGLE 24IN (TOURNIQUET CUFF) IMPLANT
DRAPE SURG 17X23 STRL (DRAPES) ×2 IMPLANT
DRSG PAD ABDOMINAL 8X10 ST (GAUZE/BANDAGES/DRESSINGS) ×2 IMPLANT
DURAPREP 26ML APPLICATOR (WOUND CARE) ×2 IMPLANT
GAUZE SPONGE 4X4 12PLY STRL (GAUZE/BANDAGES/DRESSINGS) ×2 IMPLANT
GAUZE XEROFORM 1X8 LF (GAUZE/BANDAGES/DRESSINGS) ×2 IMPLANT
GLOVE BIOGEL PI IND STRL 8 (GLOVE) ×1 IMPLANT
GLOVE BIOGEL PI INDICATOR 8 (GLOVE) ×1
GLOVE SURG ORTHO 8.0 STRL STRW (GLOVE) ×2 IMPLANT
GOWN STRL REUS W/ TWL LRG LVL3 (GOWN DISPOSABLE) ×2 IMPLANT
GOWN STRL REUS W/ TWL XL LVL3 (GOWN DISPOSABLE) ×1 IMPLANT
GOWN STRL REUS W/TWL LRG LVL3 (GOWN DISPOSABLE) ×2
GOWN STRL REUS W/TWL XL LVL3 (GOWN DISPOSABLE) ×1
KIT BASIN OR (CUSTOM PROCEDURE TRAY) ×2 IMPLANT
KIT ROOM TURNOVER OR (KITS) ×2 IMPLANT
LOOP VESSEL MAXI BLUE (MISCELLANEOUS) IMPLANT
NEEDLE HYPO 25GX1X1/2 BEV (NEEDLE) IMPLANT
NS IRRIG 1000ML POUR BTL (IV SOLUTION) ×2 IMPLANT
PACK ORTHO EXTREMITY (CUSTOM PROCEDURE TRAY) ×2 IMPLANT
PAD ARMBOARD 7.5X6 YLW CONV (MISCELLANEOUS) IMPLANT
PAD CAST 4YDX4 CTTN HI CHSV (CAST SUPPLIES) ×2 IMPLANT
PADDING CAST COTTON 4X4 STRL (CAST SUPPLIES) ×2
SPONGE GAUZE 4X4 12PLY STER LF (GAUZE/BANDAGES/DRESSINGS) ×2 IMPLANT
SUCTION FRAZIER HANDLE 10FR (MISCELLANEOUS)
SUCTION TUBE FRAZIER 10FR DISP (MISCELLANEOUS) IMPLANT
SUT ETHILON 3 0 PS 1 (SUTURE) ×2 IMPLANT
SUT VIC AB 2-0 CT1 27 (SUTURE)
SUT VIC AB 2-0 CT1 TAPERPNT 27 (SUTURE) IMPLANT
SUT VIC AB 3-0 FS2 27 (SUTURE) IMPLANT
SYR CONTROL 10ML LL (SYRINGE) IMPLANT
SYSTEM CHEST DRAIN TLS 7FR (DRAIN) IMPLANT
TOWEL OR 17X24 6PK STRL BLUE (TOWEL DISPOSABLE) ×2 IMPLANT
TOWEL OR 17X26 10 PK STRL BLUE (TOWEL DISPOSABLE) ×2 IMPLANT
TUBE CONNECTING 12X1/4 (SUCTIONS) IMPLANT
UNDERPAD 30X30 INCONTINENT (UNDERPADS AND DIAPERS) ×2 IMPLANT
WATER STERILE IRR 1000ML POUR (IV SOLUTION) IMPLANT

## 2016-01-31 NOTE — Anesthesia Procedure Notes (Signed)
Procedure Name: LMA Insertion Date/Time: 01/31/2016 2:51 PM Performed by: Glo HerringLEE, Zakhari Fogel B Pre-anesthesia Checklist: Patient identified, Emergency Drugs available, Suction available, Patient being monitored and Timeout performed Patient Re-evaluated:Patient Re-evaluated prior to inductionOxygen Delivery Method: Circle system utilized Preoxygenation: Pre-oxygenation with 100% oxygen Intubation Type: IV induction LMA: LMA inserted LMA Size: 4.0 Number of attempts: 1 Placement Confirmation: positive ETCO2 and breath sounds checked- equal and bilateral Tube secured with: Tape Dental Injury: Teeth and Oropharynx as per pre-operative assessment

## 2016-01-31 NOTE — Anesthesia Preprocedure Evaluation (Signed)
Anesthesia Evaluation  Patient identified by MRN, date of birth, ID band Patient awake    Reviewed: Allergy & Precautions, H&P , Patient's Chart, lab work & pertinent test results, reviewed documented beta blocker date and time   Airway Mallampati: II  TM Distance: >3 FB Neck ROM: full    Dental no notable dental hx.    Pulmonary Current Smoker,    Pulmonary exam normal breath sounds clear to auscultation       Cardiovascular  Rhythm:regular Rate:Normal     Neuro/Psych    GI/Hepatic   Endo/Other    Renal/GU      Musculoskeletal   Abdominal   Peds  Hematology   Anesthesia Other Findings   Reproductive/Obstetrics                             Anesthesia Physical Anesthesia Plan  ASA: II  Anesthesia Plan:    Post-op Pain Management:    Induction: Intravenous  Airway Management Planned: LMA  Additional Equipment:   Intra-op Plan:   Post-operative Plan:   Informed Consent: I have reviewed the patients History and Physical, chart, labs and discussed the procedure including the risks, benefits and alternatives for the proposed anesthesia with the patient or authorized representative who has indicated his/her understanding and acceptance.   Dental Advisory Given and Dental advisory given  Plan Discussed with: CRNA and Surgeon  Anesthesia Plan Comments: (Discussed GA with LMA, possible sore throat, potential need to switch to ETT, N/V, pulmonary aspiration. Questions answered. )        Anesthesia Quick Evaluation  

## 2016-01-31 NOTE — Transfer of Care (Signed)
Immediate Anesthesia Transfer of Care Note  Patient: Kelly Patton  Procedure(s) Performed: Procedure(s): CARPAL TUNNEL RELEASE (Right)  Patient Location: PACU  Anesthesia Type:General  Level of Consciousness: awake, alert  and oriented  Airway & Oxygen Therapy: Patient Spontanous Breathing and Patient connected to nasal cannula oxygen  Post-op Assessment: Report given to RN, Post -op Vital signs reviewed and stable and Patient moving all extremities X 4  Post vital signs: Reviewed and stable  Last Vitals:  Filed Vitals:   01/31/16 1202  BP: 149/86  Pulse: 84  Temp: 37.5 C  Resp: 18    Last Pain: There were no vitals filed for this visit.       Complications: No apparent anesthesia complications

## 2016-01-31 NOTE — H&P (Signed)
Kelly Patton is an 45 y.o. female.   Chief Complaint: Right wrist pain and numbness HPI: Because a 45 year old right-hand-dominant female with wrist pain numbness and tingling of many months duration.  She is failed conservative management.  EMG nerve study consistent with severe carpal tunnel syndrome.  She has severe clinical carpal tunnel syndrome and moderate carpal tunnel by EMG nerve study.  She has night pain and rest pain and pain which interferes with her activities of daily living.  This goes along with some loss of dexterity.  She presents now for operative management after expansion of risks and benefits  Past Medical History  Diagnosis Date  . Carpal tunnel syndrome, right   . Shortness of breath dyspnea     with exertion uses inhaler    Past Surgical History  Procedure Laterality Date  . Hand tendon surgery      right    Family History  Problem Relation Age of Onset  . Hypertension Father   . Hypertension Other    Social History:  reports that she has been smoking Cigarettes.  She has been smoking about 0.50 packs per day. She has never used smokeless tobacco. She reports that she drinks alcohol. She reports that she uses illicit drugs (Marijuana).  Allergies: No Known Allergies  Medications Prior to Admission  Medication Sig Dispense Refill  . albuterol (PROVENTIL HFA;VENTOLIN HFA) 108 (90 Base) MCG/ACT inhaler Inhale 1-2 puffs into the lungs every 6 (six) hours as needed for wheezing or shortness of breath. 1 Inhaler 0  . oxyCODONE-acetaminophen (PERCOCET/ROXICET) 5-325 MG tablet Take 1 tablet by mouth every 8 (eight) hours as needed for moderate pain.   0    Results for orders placed or performed during the hospital encounter of 01/31/16 (from the past 48 hour(s))  CBC     Status: None   Collection Time: 01/31/16 12:10 PM  Result Value Ref Range   WBC 4.8 4.0 - 10.5 K/uL   RBC 4.68 3.87 - 5.11 MIL/uL   Hemoglobin 13.6 12.0 - 15.0 g/dL   HCT 16.141.6 09.636.0 -  04.546.0 %   MCV 88.9 78.0 - 100.0 fL   MCH 29.1 26.0 - 34.0 pg   MCHC 32.7 30.0 - 36.0 g/dL   RDW 40.912.7 81.111.5 - 91.415.5 %   Platelets 325 150 - 400 K/uL   No results found.  Review of Systems  Constitutional: Negative.   HENT: Negative.   Eyes: Negative.   Respiratory: Negative.   Cardiovascular: Negative.   Gastrointestinal: Negative.   Genitourinary: Negative.   Musculoskeletal: Positive for joint pain.  Skin: Negative.   Neurological: Negative.   Endo/Heme/Allergies: Negative.   Psychiatric/Behavioral: Negative.     Blood pressure 149/86, pulse 84, temperature 99.5 F (37.5 C), temperature source Oral, resp. rate 18, height 5' 2.5" (1.588 m), weight 76.658 kg (169 lb), last menstrual period 12/21/2015, SpO2 100 %. Physical Exam  Constitutional: She appears well-developed.  HENT:  Head: Normocephalic.  Eyes: Pupils are equal, round, and reactive to light.  Neck: Normal range of motion.  Cardiovascular: Normal rate.   Respiratory: Effort normal.  Neurological: She is alert.  Skin: Skin is warm.  Psychiatric: She has a normal mood and affect.   examination right hand demonstrates mild wasting of abductor pollicis brevis but with positive carpal tunnel compression testing on the right-hand side and is perfused and sensate wrist range of motion is intact negative Tinel's cubital tunnel in the elbow on the right  Assessment/Plan Impression is  right carpal tunnel syndrome plan right carpal tunnel release risks benefits discussed with patient including but not limited to infection or vessel damage wrist stiffness loss of grip strength all questions answered  Cammy Copa, MD 01/31/2016, 2:22 PM

## 2016-02-01 ENCOUNTER — Encounter (HOSPITAL_COMMUNITY): Payer: Self-pay | Admitting: Orthopedic Surgery

## 2016-02-02 NOTE — Anesthesia Postprocedure Evaluation (Signed)
Anesthesia Post Note  Patient: Kelly Patton  Procedure(s) Performed: Procedure(s) (LRB): CARPAL TUNNEL RELEASE (Right)  Patient location during evaluation: PACU Anesthesia Type: General Level of consciousness: sedated Pain management: satisfactory to patient Vital Signs Assessment: post-procedure vital signs reviewed and stable Respiratory status: spontaneous breathing Cardiovascular status: stable Anesthetic complications: no     Last Vitals:  Filed Vitals:   01/31/16 1615 01/31/16 1630  BP: 142/99 138/94  Pulse: 87 91  Temp: 36.6 C   Resp: 17     Last Pain: There were no vitals filed for this visit. Pain Goal:                 Jiles GarterJACKSON,Subrina Vecchiarelli EDWARD

## 2016-04-02 NOTE — Op Note (Signed)
NAME:  [name], [name] NO.:  [id number]  MEDICAL RECORD NO.:  [id number]  LOCATION:  [facility]                         FACILITY:  [facility]  PHYSICIAN:  Burnard Bunting, M.D.    DATE OF BIRTH:  November 07, 1970  DATE OF PROCEDURE: DATE OF DISCHARGE:  01/31/2016                              OPERATIVE REPORT   PREOPERATIVE DIAGNOSIS:  Right carpal tunnel syndrome.  POSTOPERATIVE DIAGNOSIS:  Right carpal tunnel syndrome.  PROCEDURE:  Right carpal tunnel release.  SURGEON:  Burnard Bunting, M.D.  ASSISTANT:  None.  ANESTHESIA:  General.  INDICATIONS:  Kelly Patton is a patient with carpal tunnel syndrome refractory of nonoperative management, who presents for operative management after explanation of risks and benefits.  DESCRIPTION OF PROCEDURE:  The patient was brought to the operating room where general endotracheal anesthesia was induced.  Preoperative antibiotics administered.  Time-out was called.  Right arm was prescrubbed with alcohol and Betadine, allowed to air dry, prepped with DuraPrep solution and draped in a sterile manner.  An incision was made at the intersection of Kaplan's cardinal line and radial border of the 4th finger taken down to the proximal wrist flexion crease.  Skin and subcutaneous tissue were sharply divided.  The palmar fascia was incised.  Transverse carpal ligament was visualized.  Self-retaining retractor was placed.  Bipolar electrocautery used to control bleeding points and longitudinal incision was made in the transverse carpal ligament 1-2 mm.  Right angle retractor was placed between the median nerve and the transverse carpal ligament which was then divided proximally to the forearm fascia under direct visualization and distally to end of the transverse carpal ligament under direct visualization. Motor branch was intact.  No other masses present within the carpal canal.  Thorough irrigation performed.  Skin edges were anesthetized with  Marcaine.  Tourniquet released.  Bleeding points encountered controlled using bipolar electrocautery.  Skin closed using a 3-0 Vicryl suture.  Bulky splint applied.  The patient tolerated the procedure well without immediate complications and transferred to the recovery room in stable condition.    Burnard Bunting, M.D.    GSD/MEDQ  D:  04/02/2016  T:  04/02/2016  Job:  [id number]

## 2016-07-20 NOTE — Congregational Nurse Program (Signed)
Congregational Nurse Program Note  Date of Encounter: 07/05/2016  Past Medical History: Past Medical History:  Diagnosis Date  . Carpal tunnel syndrome, right   . Shortness of breath dyspnea    with exertion uses inhaler    Encounter Details:     CNP Questionnaire - 07/05/16 0119      Patient Demographics   Is this a new or existing patient? Existing   Patient is considered a/an Not Applicable   Race African-American/Black     Patient Assistance   [facility]   Patient's financial/insurance status Self-Pay (Uninsured)   Uninsured Patient ([employer], trade [employer]Card/Care Connects) No   Patient referred to apply for the following financial assistance Not Applicable   Food insecurities addressed Not Applicable   Transportation assistance No   Assistance securing medications No   Educational health offerings Other  dental issue     Encounter Details   Primary purpose of visit Other  screening   Was an Emergency Department visit averted? Not Applicable   Does patient have a medical provider? Yes   Patient referred to Not Applicable   Was a mental health screening completed? (GAINS tool) No   Does patient have dental issues? Yes   Was a dental referral made? No resources for a referral   Does patient have vision issues? No   Does your patient have an abnormal blood pressure today? No   Since previous encounter, have you referred patient for abnormal blood pressure that resulted in a new diagnosis or medication change? No   Does your patient have an abnormal blood glucose today? No   Since previous encounter, have you referred patient for abnormal blood glucose that resulted in a new diagnosis or medication change? No   Was there a life-saving intervention made? No      Complained about toothache. Client reports she is waiting on an appt for her dental care.  Will followup if unable to find care.

## 2016-07-20 NOTE — Congregational Nurse Program (Signed)
Congregational Nurse Program Note  Date of Encounter: 06/21/2016  Past Medical History: Past Medical History:  Diagnosis Date  . Carpal tunnel syndrome, right   . Shortness of breath dyspnea    with exertion uses inhaler    Encounter Details:     CNP Questionnaire - 06/21/16 0102      Patient Demographics   Is this a new or existing patient? New   Patient is considered a/an Not Applicable   Race African-American/Black     Patient Assistance   [facility]   Patient's financial/insurance status Self-Pay (Uninsured)   Uninsured Patient ([employer], trade [employer]/Care Connects) No   Patient referred to apply for the following financial assistance Not Applicable   Food insecurities addressed Not Applicable   Transportation assistance No   Assistance securing medications No   Educational health offerings Other  dental issue     Encounter Details   Primary purpose of visit Other  screening   Was an Emergency Department visit averted? Not Applicable   Does patient have a medical provider? Yes   Patient referred to Not Applicable   Was a mental health screening completed? (GAINS tool) No   Does patient have dental issues? Yes   Was a dental referral made? No resources for a referral   Does patient have vision issues? No   Does your patient have an abnormal blood pressure today? No   Since previous encounter, have you referred patient for abnormal blood pressure that resulted in a new diagnosis or medication change? No   Does your patient have an abnormal blood glucose today? No   Since previous encounter, have you referred patient for abnormal blood glucose that resulted in a new diagnosis or medication change? No   Was there a life-saving intervention made? No      Client seen for screening of BP  and BS and flu vaccine.  Complained about toothache

## 2016-08-18 ENCOUNTER — Emergency Department (HOSPITAL_COMMUNITY)
Admission: EM | Admit: 2016-08-18 | Discharge: 2016-08-18 | Disposition: A | Discharge status: Home / Self Care | Payer: BLUE CROSS/BLUE SHIELD | Attending: Emergency Medicine | Admitting: Emergency Medicine

## 2016-08-18 ENCOUNTER — Encounter (HOSPITAL_COMMUNITY): Payer: Self-pay

## 2016-08-18 DIAGNOSIS — Z79899 Other long term (current) drug therapy: Secondary | ICD-10-CM | POA: Diagnosis not present

## 2016-08-18 DIAGNOSIS — F17210 Nicotine dependence, cigarettes, uncomplicated: Secondary | ICD-10-CM | POA: Diagnosis not present

## 2016-08-18 DIAGNOSIS — R059 Cough, unspecified: Secondary | ICD-10-CM

## 2016-08-18 DIAGNOSIS — R05 Cough: Secondary | ICD-10-CM | POA: Insufficient documentation

## 2016-08-18 LAB — BASIC METABOLIC PANEL
Anion gap: 6 (ref 5–15)
BUN: 12 mg/dL (ref 6–20)
CALCIUM: 9.4 mg/dL (ref 8.9–10.3)
CO2: 27 mmol/L (ref 22–32)
CREATININE: 0.85 mg/dL (ref 0.44–1.00)
Chloride: 105 mmol/L (ref 101–111)
GFR calc non Af Amer: >60 mL/min (ref >60)
Glucose, Bld: 98 mg/dL (ref 65–99)
Potassium: 4.2 mmol/L (ref 3.5–5.1)
SODIUM: 138 mmol/L (ref 135–145)

## 2016-08-18 LAB — CBC
HCT: 37.9 % (ref 36.0–46.0)
HEMOGLOBIN: 12.5 g/dL (ref 12.0–15.0)
MCH: 29.6 pg (ref 26.0–34.0)
MCHC: 33 g/dL (ref 30.0–36.0)
MCV: 89.6 fL (ref 78.0–100.0)
Platelets: 334 10*3/uL (ref 150–400)
RBC: 4.23 MIL/uL (ref 3.87–5.11)
RDW: 12.6 % (ref 11.5–15.5)
WBC: 8.8 10*3/uL (ref 4.0–10.5)

## 2016-08-18 LAB — I-STAT TROPONIN, ED: TROPONIN I, POC: 0 ng/mL (ref 0.00–0.08)

## 2016-08-18 MED ORDER — GUAIFENESIN 100 MG/5ML PO LIQD
100.0000 mg | ORAL | 0 refills | Status: DC | PRN
Start: 2016-08-18 — End: 2017-04-08

## 2016-08-18 MED ORDER — AMOXICILLIN 250 MG PO CAPS: 500.0000 mg | ORAL_CAPSULE | Freq: Two times a day (BID) | ORAL | 0 refills | Status: AC

## 2016-08-18 MED ORDER — ALBUTEROL SULFATE HFA 108 (90 BASE) MCG/ACT IN AERS
2.0000 | INHALATION_SPRAY | Freq: Once | RESPIRATORY_TRACT | Status: AC
  Administered 2016-08-18: 2 via RESPIRATORY_TRACT
  Filled 2016-08-18: qty 6.7

## 2016-08-18 MED ORDER — FLUTICASONE PROPIONATE 50 MCG/ACT NA SUSP: 2.0000 | Freq: Every day | NASAL | 0 refills | Status: DC

## 2016-08-18 MED ORDER — BENZONATATE 100 MG PO CAPS: 100.0000 mg | ORAL_CAPSULE | Freq: Three times a day (TID) | ORAL | 0 refills | Status: DC

## 2016-08-18 NOTE — ED Notes (Signed)
No order for aerochamber, d/c instructions stated to use.  Pt did not want to wait to receive from pharmacy.  PA advised.

## 2016-08-18 NOTE — ED Provider Notes (Signed)
[facility] Provider Note  CSN: [id number] Arrival date & time: 08/18/16  1029  History   Chief Complaint Chief Complaint  Patient presents with  . Chest Pain  . Cough   HPI Kelly Patton is a 45 y.o. female with pmh as noted below presents for upper respiratory symptoms including sore throat, body aches, feeling cold/hot, chest congestion and coughing that started 6 days ago.  Pt has tried Nyquil but states it only last 2-3 hours and as soon as it wear off she starts having coughing with production of clear/white sputum.  Pt had flu shot this season.  Pt smokes 1-2 cigarettes once a week.  Pt denies fevers, hemoptysis, shortness of breath, chest pain.  Pt denies nausea, vomiting, chest pain/pressure, abdominal pain, urinary symptoms, lower extremity swelling.  No h/o heart, COPD or allergies.  Pt states two coworkers have similar symptoms.  Pt has h/o of exertional asthma, ran out of albuterol inhaler months ago, has been unable to refill it because of cost.  Pt requests albuterol today.  HPI  Past Medical History:  Diagnosis Date  . Carpal tunnel syndrome, right   . Shortness of breath dyspnea    with exertion uses inhaler    There are no active problems to display for this patient.   Past Surgical History:  Procedure Laterality Date  . CARPAL TUNNEL RELEASE Right 01/31/2016   Procedure: CARPAL TUNNEL RELEASE;  Surgeon: Cammy CopaScott Gregory Dean, MD;  [facility];  Service: Orthopedics;  Laterality: Right;  . HAND TENDON SURGERY     right    OB History    No data available      Home Medications    Prior to Admission medications   Medication Sig Start Date End Date Taking? Authorizing Provider  albuterol (PROVENTIL HFA;VENTOLIN HFA) 108 (90 Base) MCG/ACT inhaler Inhale 1-2 puffs into the lungs every 6 (six) hours as needed for wheezing or shortness of breath. 11/10/15   Danelle BerryLeisa Tapia, PA-C    Family History Family History  Problem Relation Age of Onset  .  Hypertension Father   . Hypertension Other     Social History Social History  Substance Use Topics  . Smoking status: Light Tobacco Smoker    Packs/day: 0.50    Types: Cigarettes  . Smokeless tobacco: Never Used  . Alcohol use Yes     Comment: " once a week"     Allergies   Patient has no known allergies.   Review of Systems Review of Systems  Constitutional: Positive for chills. Negative for appetite change, fatigue and fever.  HENT: Positive for congestion, postnasal drip, rhinorrhea and sore throat. Negative for ear pain, sinus pain, sneezing and trouble swallowing.   Eyes: Negative for photophobia.  Respiratory: Positive for cough and chest tightness. Negative for shortness of breath and wheezing.   Cardiovascular: Negative for chest pain and leg swelling.  Gastrointestinal: Positive for nausea. Negative for abdominal pain, constipation, diarrhea and vomiting.  Genitourinary: Negative for dysuria.  Musculoskeletal: Negative for back pain and neck stiffness.  Skin: Negative for rash.  Neurological: Negative for dizziness, light-headedness and headaches.  Hematological: Negative.   Psychiatric/Behavioral: Negative.   All other systems reviewed and are negative.   Physical Exam Updated Vital Signs BP 128/94 (BP Location: Right Arm)   Pulse 77   Temp 98.7 F (37.1 C) (Oral)   Resp 18   Ht 5' 2.5" (1.588 m)   Wt 73.5 kg   LMP 08/17/2016   SpO2  100%   BMI 29.16 kg/m   Physical Exam  Constitutional: She is oriented to person, place, and time. Vital signs are normal. She appears well-developed and well-nourished. No distress.  Pt found seated on chair in room on phone.  HENT:  Head: Normocephalic and atraumatic.  Right Ear: External ear normal.  Left Ear: External ear normal.  Mouth/Throat: Oropharynx is clear and moist. No oropharyngeal exudate.  Mildly edematous and erythematous inferior turbinates.  Mildly erythematous oropharynx with post nasal drip noted,  no cobblestoning, no exudates.  Eyes: Conjunctivae and EOM are normal. Pupils are equal, round, and reactive to light.  Neck: Normal range of motion. Neck supple.  Non tender submandibular cervical adenopathy bilaterally.   Cardiovascular: Normal rate.   Pulmonary/Chest: Effort normal and breath sounds normal. No respiratory distress. She has no wheezes.  Pt coughing during exam.  Abdominal: Soft. There is no tenderness.  Musculoskeletal: Normal range of motion.  Lymphadenopathy:    She has no cervical adenopathy.  Neurological: She is alert and oriented to person, place, and time.  Skin: Skin is warm and dry. Capillary refill takes less than 2 seconds.  Psychiatric: She has a normal mood and affect. Her behavior is normal.  Nursing note and vitals reviewed.   ED Treatments / Results  Labs (all labs ordered are listed, but only abnormal results are displayed) Labs Reviewed  CBC  BASIC METABOLIC PANEL  I-STAT TROPOININ, ED    EKG  EKG Interpretation None       Radiology No results found.  Procedures Procedures (including critical care time)  Medications Ordered in ED Medications  albuterol (PROVENTIL HFA;VENTOLIN HFA) 108 (90 Base) MCG/ACT inhaler 2 puff (not administered)     Initial Impression / Assessment and Plan / ED Course  I have reviewed the triage vital signs and the nursing notes.  Pertinent labs & imaging results that were available during my care of the patient were reviewed by me and considered in my medical decision making (see chart for details).  Clinical Course    45 yo female presents with upper respiratory symptoms most c/w viral URI.  No fever, no hemoptysis, no recent travel, no h/o cardiac or COPD.  Pt reports sick contacts at work with similar symptoms.  On exam pt has afebrile with VSS, RRR, lungs clear to auscultation bilaterally.  Mildly edematous and erythematous inferior turbinates.  Mildly erythematous oropharynx with post nasal drip  noted, no cobblestoning, no exudates.  Triage ordered CBC, troponin, BMP which were negative.  I cancelled CXR as I suspect viral URI and doubt PNA.  Pt stable for discharge with work note, tessalon, mucinex, albuterol/spacer, tylenol, flonase.  Pt given amoxicillin prescription, instructed to only refill it if symptoms do not improve in 10 days.  Pt agreeable to dispo plan.  ED return instructions given.    Final Clinical Impressions(s) / ED Diagnoses   Final diagnoses:  None    New Prescriptions New Prescriptions   No medications on file     Jerrell MylarClaudia J Norva Bowe, PA-C 08/19/16 [id number]    Lyndal Pulleyaniel Knott, MD 08/20/16 1556

## 2016-08-18 NOTE — Discharge Instructions (Signed)
Your symptoms are consistent with a viral upper respiratory infection.    Please make sure you rest and have plenty of fluids to stay hydrated.    Use albuterol with spacer, as instructed, for shortness of breath and chest tightness.   Use tylenol of body aches and fevers as needed.  Use Robitussin and tessalon for cough and chest congestion.    Use flonase nasal spray as instructed for nasal congestion and post nasal drip, which may be exacerbating your cough.    You have been given a prescription for amoxicillin 500mg  twice a day for 10 days.  ONLY refill this prescription if your symptoms do not improve in a total of 10 days.   Return to ED if you experience fevers with increased cough, bloody phlegm, shortness of breath.   [facility] [phone]) to establish care with a primary care provider, if you do not already have one.

## 2016-08-18 NOTE — ED Triage Notes (Signed)
Pt. Having cough and Upper resp symtpoms since Monday and developed chest tightness.  Productive cough  Chills and feels hot, skin is warm and dry. ECG completed in Triage

## 2016-10-23 ENCOUNTER — Emergency Department (HOSPITAL_COMMUNITY)
Admission: EM | Admit: 2016-10-23 | Discharge: 2016-10-23 | Disposition: A | Discharge status: Home / Self Care | Payer: BLUE CROSS/BLUE SHIELD | Attending: Emergency Medicine | Admitting: Emergency Medicine

## 2016-10-23 ENCOUNTER — Encounter (HOSPITAL_COMMUNITY): Payer: Self-pay

## 2016-10-23 DIAGNOSIS — Z87891 Personal history of nicotine dependence: Secondary | ICD-10-CM | POA: Insufficient documentation

## 2016-10-23 DIAGNOSIS — J111 Influenza due to unidentified influenza virus with other respiratory manifestations: Secondary | ICD-10-CM

## 2016-10-23 DIAGNOSIS — R05 Cough: Secondary | ICD-10-CM | POA: Diagnosis present

## 2016-10-23 DIAGNOSIS — R69 Illness, unspecified: Secondary | ICD-10-CM

## 2016-10-23 MED ORDER — KETOROLAC TROMETHAMINE 60 MG/2ML IM SOLN
60.0000 mg | Freq: Once | INTRAMUSCULAR | Status: AC
  Administered 2016-10-23: 60 mg via INTRAMUSCULAR
  Filled 2016-10-23: qty 2

## 2016-10-23 MED ORDER — IBUPROFEN 800 MG PO TABS: 800.0000 mg | ORAL_TABLET | Freq: Three times a day (TID) | ORAL | 0 refills | Status: DC

## 2016-10-23 MED ORDER — BENZONATATE 100 MG PO CAPS: 100.0000 mg | ORAL_CAPSULE | Freq: Three times a day (TID) | ORAL | 0 refills | Status: DC

## 2016-10-23 NOTE — Discharge Instructions (Signed)
Your symptoms are consistent with a viral illness. Viruses do not require antibiotics. Treatment is symptomatic care and it is important to note that these symptoms may last for 7-14 days. Symptoms will be intensified and complicated by dehydration. Dehydration can also extend the duration of symptoms. Drink plenty of fluids and get plenty of rest. You should be drinking at least half a liter of water an hour to stay hydrated. Electrolyte drinks are also encouraged. You should be drinking enough fluids to make your urine light yellow, almost clear. If this is not the case, you are not drinking enough water. Ibuprofen, Naproxen, or Tylenol for pain or fever. Tessalon for cough. Plain Mucinex may help relieve congestion. Saline sinus rinses and saline nasal sprays may also help relieve congestion. Warm liquids or Chloraseptic spray may help soothe a sore throat. Follow up with a primary care provider, as needed, for any future management of this issue. °

## 2016-10-23 NOTE — ED Provider Notes (Signed)
[facility]-EMERGENCY DEPT Provider Note   CSN: [id number] Arrival date & time: 10/23/16  1121  By signing my name below, I, Kelly Patton, attest that this documentation has been prepared under the direction and in the presence of Celine Dishman, PA-C. Electronically Signed: Freida Busmaniana Patton, Scribe. 10/23/2016. 12:06 PM.  History   Chief Complaint Chief Complaint  Patient presents with  . Cough  . Sore Throat  . Generalized Body Aches   The history is provided by the patient. No language interpreter was used.    HPI Comments:  Kelly Patton is a 46 y.o. female who presents to the Emergency Department complaining of generalized body aches for about 3 days. She also notes nonproductive cough and sore throat. Pt notes her symptoms worsened last night. She has taken dayquil and aleve without relief.  She notes recent sick contacts with the flu. Denies fever/chills, N/V/D, shortness of breath, chest pain, or any other complaints. She also denies h/o cardiac or pulmonary issues.      Past Medical History:  Diagnosis Date  . Carpal tunnel syndrome, right   . Shortness of breath dyspnea    with exertion uses inhaler    There are no active problems to display for this patient.   Past Surgical History:  Procedure Laterality Date  . CARPAL TUNNEL RELEASE Right 01/31/2016   Procedure: CARPAL TUNNEL RELEASE;  Surgeon: Cammy CopaScott Gregory Dean, MD;  [facility];  Service: Orthopedics;  Laterality: Right;  . HAND TENDON SURGERY     right    OB History    No data available       Home Medications    Prior to Admission medications   Medication Sig Start Date End Date Taking? Authorizing Provider  albuterol (PROVENTIL HFA;VENTOLIN HFA) 108 (90 Base) MCG/ACT inhaler Inhale 1-2 puffs into the lungs every 6 (six) hours as needed for wheezing or shortness of breath. 11/10/15   Danelle BerryLeisa Tapia, PA-C  benzonatate (TESSALON) 100 MG capsule Take 1 capsule (100 mg total) by mouth every 8 (eight) hours.  08/18/16   Liberty Handylaudia J Gibbons, PA-C  benzonatate (TESSALON) 100 MG capsule Take 1 capsule (100 mg total) by mouth every 8 (eight) hours. 10/23/16   Leean Amezcua C Bijal Siglin, PA-C  fluticasone (FLONASE) 50 MCG/ACT nasal spray Place 2 sprays into both nostrils daily. 08/18/16   Liberty Handylaudia J Gibbons, PA-C  guaiFENesin (ROBITUSSIN) 100 MG/5ML liquid Take 5-10 mLs (100-200 mg total) by mouth every 4 (four) hours as needed for cough. 08/18/16   Liberty Handylaudia J Gibbons, PA-C  ibuprofen (ADVIL,MOTRIN) 800 MG tablet Take 1 tablet (800 mg total) by mouth 3 (three) times daily. 10/23/16   Anselm PancoastShawn C Tomeka Kantner, PA-C    Family History Family History  Problem Relation Age of Onset  . Hypertension Father   . Hypertension Other     Social History Social History  Substance Use Topics  . Smoking status: Former Smoker    Packs/day: 0.50    Types: Cigarettes  . Smokeless tobacco: Never Used  . Alcohol use Yes     Comment: " once a week"     Allergies   Patient has no known allergies.   Review of Systems Review of Systems  Constitutional: Negative for fever.  HENT: Positive for sore throat. Negative for trouble swallowing.   Respiratory: Positive for cough. Negative for shortness of breath.   Cardiovascular: Negative for chest pain.  Gastrointestinal: Negative for abdominal pain, diarrhea and vomiting.  Musculoskeletal: Positive for myalgias. Negative for neck stiffness.  All other systems reviewed and are negative.    Physical Exam Updated Vital Signs BP 120/93   Pulse 111   Temp 97.9 F (36.6 C)   Resp 16   Ht 5' 2.5" (1.588 m)   Wt 163 lb (73.9 kg)   LMP 10/09/2016 (Approximate)   SpO2 100%   BMI 29.34 kg/m   Physical Exam  Constitutional: She appears well-developed and well-nourished. No distress.  HENT:  Head: Normocephalic and atraumatic.  Mouth/Throat: Oropharynx is clear and moist.  Eyes: Conjunctivae are normal.  Neck: Neck supple.  Cardiovascular: Normal rate, regular rhythm, normal heart sounds and  intact distal pulses.   Pulmonary/Chest: Effort normal and breath sounds normal. No respiratory distress.  Abdominal: Soft. There is no tenderness. There is no guarding.  Musculoskeletal: She exhibits no edema.  Lymphadenopathy:    She has no cervical adenopathy.  Neurological: She is alert.  Skin: Skin is warm and dry. She is not diaphoretic.  Psychiatric: She has a normal mood and affect. Her behavior is normal.  Nursing note and vitals reviewed.    ED Treatments / Results  DIAGNOSTIC STUDIES:  Oxygen Saturation is 100% on RA, normal by my interpretation.    COORDINATION OF CARE:  12:04 PM Will order IM toradol and discharge home. Discussed treatment plan with pt at bedside and pt agreed to plan.  Labs (all labs ordered are listed, but only abnormal results are displayed) Labs Reviewed - No data to display  EKG  EKG Interpretation None       Radiology No results found.  Procedures Procedures (including critical care time)  Medications Ordered in ED Medications  ketorolac (TORADOL) injection 60 mg (60 mg Intramuscular Given 10/23/16 1226)     Initial Impression / Assessment and Plan / ED Course  I have reviewed the triage vital signs and the nursing notes.  Pertinent labs & imaging results that were available during my care of the patient were reviewed by me and considered in my medical decision making (see chart for details).      Patient presents with influenza-like symptoms. She is nontoxic appearing. Doubt sepsis. Supportive care and return precautions discussed. PCP follow-up as needed. Patient voices understanding of all instructions and is comfortable with discharge.  Vitals:   10/23/16 1126 10/23/16 1136 10/23/16 1225  BP: 120/93    Pulse: 111  99  Resp: 16    Temp: 97.9 F (36.6 C)    SpO2: 100%  98%  Weight:  73.9 kg   Height:  5' 2.5" (1.588 m)       Final Clinical Impressions(s) / ED Diagnoses   Final diagnoses:  Influenza-like  illness    New Prescriptions Discharge Medication List as of 10/23/2016 12:10 PM    START taking these medications   Details  !! benzonatate (TESSALON) 100 MG capsule Take 1 capsule (100 mg total) by mouth every 8 (eight) hours., Starting Tue 10/23/2016, Print    ibuprofen (ADVIL,MOTRIN) 800 MG tablet Take 1 tablet (800 mg total) by mouth 3 (three) times daily., Starting Tue 10/23/2016, Print     !! - Potential duplicate medications found. Please discuss with provider.     I personally performed the services described in this documentation, which was scribed in my presence. The recorded information has been reviewed and is accurate.    Anselm Pancoast, PA-C 10/25/16 0825    Derwood Kaplan, MD 10/25/16 1431

## 2016-11-03 ENCOUNTER — Emergency Department (HOSPITAL_COMMUNITY)
Admission: EM | Admit: 2016-11-03 | Discharge: 2016-11-03 | Disposition: A | Discharge status: Home / Self Care | Payer: BLUE CROSS/BLUE SHIELD | Attending: Emergency Medicine | Admitting: Emergency Medicine

## 2016-11-03 ENCOUNTER — Emergency Department (HOSPITAL_COMMUNITY): Payer: BLUE CROSS/BLUE SHIELD

## 2016-11-03 ENCOUNTER — Encounter (HOSPITAL_COMMUNITY): Payer: Self-pay

## 2016-11-03 DIAGNOSIS — R059 Cough, unspecified: Secondary | ICD-10-CM

## 2016-11-03 DIAGNOSIS — Z79899 Other long term (current) drug therapy: Secondary | ICD-10-CM | POA: Diagnosis not present

## 2016-11-03 DIAGNOSIS — R05 Cough: Secondary | ICD-10-CM | POA: Diagnosis not present

## 2016-11-03 DIAGNOSIS — Z87891 Personal history of nicotine dependence: Secondary | ICD-10-CM | POA: Insufficient documentation

## 2016-11-03 MED ORDER — DOXYCYCLINE HYCLATE 100 MG PO CAPS: 100.0000 mg | ORAL_CAPSULE | Freq: Two times a day (BID) | ORAL | 0 refills | Status: DC

## 2016-11-03 MED ORDER — GUAIFENESIN-CODEINE 100-10 MG/5ML PO SOLN: 5.0000 mL | Freq: Four times a day (QID) | ORAL | 0 refills | Status: DC | PRN

## 2016-11-03 NOTE — ED Triage Notes (Signed)
Pt complaining of fever and chills x 3 weeks. Pt states coughing x same. Pt states black/green sputum. Pt denies any SOB. Pt complaining of generalized body aches.

## 2016-11-03 NOTE — ED Notes (Signed)
Declined W/C at D/C and was escorted to lobby by RN. 

## 2016-11-03 NOTE — ED Provider Notes (Signed)
[facility]-EMERGENCY DEPT Provider Note   CSN: [id number] Arrival date & time: 11/03/16  1540  By signing my name below, I, [name] and [name], attest that this documentation has been prepared under the direction and in the presence of Roxy Horseman, PA-C. Electronically Signed: [name] and [name], ED Scribe. 11/03/16. 4:55 PM.   History   Chief Complaint Chief Complaint  Patient presents with  . Cough    HPI Kelly Patton is a 46 y.o. female who presents to the Emergency Department complaining of a persistent, productive cough for the last two weeks. Associated sx include generalized myalgias and chills. Treatments tried at home include Motrin and OTC cough syrup with temporary relief. On 10/23/16 pt went to the ED for similar symptoms and was given Tessalon and ibuprofen, which she feels has not alleviated her symptoms. Pt reports she received a flu shot this year. No h/o asthma or COPD. Pt denies fever and any other associated symptoms.   The history is provided by the patient and medical records. No language interpreter was used.    Past Medical History:  Diagnosis Date  . Carpal tunnel syndrome, right   . Shortness of breath dyspnea    with exertion uses inhaler    There are no active problems to display for this patient.   Past Surgical History:  Procedure Laterality Date  . CARPAL TUNNEL RELEASE Right 01/31/2016   Procedure: CARPAL TUNNEL RELEASE;  Surgeon: Cammy Copa, MD;  Location: [facility] OR;  Service: Orthopedics;  Laterality: Right;  . HAND TENDON SURGERY     right    OB History    No data available       Home Medications    Prior to Admission medications   Medication Sig Start Date End Date Taking? Authorizing Provider  albuterol (PROVENTIL HFA;VENTOLIN HFA) 108 (90 Base) MCG/ACT inhaler Inhale 1-2 puffs into the lungs every 6 (six) hours as needed for wheezing or shortness of breath. 11/10/15   Danelle Berry, PA-C  benzonatate  (TESSALON) 100 MG capsule Take 1 capsule (100 mg total) by mouth every 8 (eight) hours. 08/18/16   Liberty Handy, PA-C  benzonatate (TESSALON) 100 MG capsule Take 1 capsule (100 mg total) by mouth every 8 (eight) hours. 10/23/16   Shawn C Joy, PA-C  fluticasone (FLONASE) 50 MCG/ACT nasal spray Place 2 sprays into both nostrils daily. 08/18/16   Liberty Handy, PA-C  guaiFENesin (ROBITUSSIN) 100 MG/5ML liquid Take 5-10 mLs (100-200 mg total) by mouth every 4 (four) hours as needed for cough. 08/18/16   Liberty Handy, PA-C  ibuprofen (ADVIL,MOTRIN) 800 MG tablet Take 1 tablet (800 mg total) by mouth 3 (three) times daily. 10/23/16   Anselm Pancoast, PA-C    Family History Family History  Problem Relation Age of Onset  . Hypertension Father   . Hypertension Other     Social History Social History  Substance Use Topics  . Smoking status: Former Smoker    Packs/day: 0.50    Types: Cigarettes  . Smokeless tobacco: Never Used  . Alcohol use Yes     Comment: " once a week"     Allergies   Patient has no known allergies.   Review of Systems Review of Systems  Constitutional: Positive for chills. Negative for fever.  Respiratory: Positive for cough (productive).   Musculoskeletal: Positive for myalgias (generalized).     Physical Exam Updated Vital Signs BP 139/95 (BP Location: Left Arm)  Pulse 73   Temp 98 F (36.7 C) (Oral)   Resp 17   Ht 5\' 2"  (1.575 m)   Wt 163 lb (73.9 kg)   LMP 10/09/2016 (Approximate)   SpO2 100%   BMI 29.81 kg/m   Physical Exam Physical Exam  Constitutional: Pt  is oriented to person, place, and time. Appears well-developed and well-nourished. No distress.  HENT:  Head: Normocephalic and atraumatic.  Right Ear: Tympanic membrane, external ear and ear canal normal.  Left Ear: Tympanic membrane, external ear and ear canal normal.  Nose: Mucosal edema and mild rhinorrhea present. No epistaxis. Right sinus exhibits no maxillary sinus tenderness  and no frontal sinus tenderness. Left sinus exhibits no maxillary sinus tenderness and no frontal sinus tenderness.  Mouth/Throat: Uvula is midline and mucous membranes are normal. Mucous membranes are not pale and not cyanotic. No oropharyngeal exudate, posterior oropharyngeal edema, posterior oropharyngeal erythema or tonsillar abscesses.  Eyes: Conjunctivae are normal. Pupils are equal, round, and reactive to light.  Neck: Normal range of motion and full passive range of motion without pain.  Cardiovascular: Normal rate and intact distal pulses.   Pulmonary/Chest: Effort normal and breath sounds normal. No stridor.  Clear and equal breath sounds without focal wheezes, rhonchi, rales  Abdominal: Soft. Bowel sounds are normal. There is no tenderness.  Musculoskeletal: Normal range of motion.  Lymphadenopathy:    Pthas no cervical adenopathy.  Neurological: Pt is alert and oriented to person, place, and time.  Skin: Skin is warm and dry. No rash noted. Pt is not diaphoretic.  Psychiatric: Normal mood and affect.  Nursing note and vitals reviewed.   ED Treatments / Results  DIAGNOSTIC STUDIES: Oxygen Saturation is 100% on RA, normal by my interpretation.   COORDINATION OF CARE: 4:48 PM-Discussed next steps with pt which includes CXR. Pt verbalized understanding and is agreeable with the plan.    Labs (all labs ordered are listed, but only abnormal results are displayed) Labs Reviewed - No data to display  EKG  EKG Interpretation None       Radiology Dg Chest 2 View  Result Date: 11/03/2016 CLINICAL DATA:  Fever, chills, and productive cough for 3 weeks. EXAM: CHEST  2 VIEW COMPARISON:  None. FINDINGS: The heart size and mediastinal contours are within normal limits. Both lungs are clear. The visualized skeletal structures are unremarkable. IMPRESSION: No active cardiopulmonary disease. Electronically Signed   By: Myles RosenthalJohn  Stahl M.D.   On: 11/03/2016 17:34     Procedures Procedures (including critical care time)  Medications Ordered in ED Medications - No data to display   Initial Impression / Assessment and Plan / ED Course  I have reviewed the triage vital signs and the nursing notes.  Pertinent labs & imaging results that were available during my care of the patient were reviewed by me and considered in my medical decision making (see chart for details).    Patient with URI symptoms x 2 weeks.  No improvement.  CXR is negative.  May benefit from some abx given length of illness.  VSS.  Final Clinical Impressions(s) / ED Diagnoses   Final diagnoses:  Cough    New Prescriptions Discharge Medication List as of 11/03/2016  5:49 PM    START taking these medications   Details  doxycycline (VIBRAMYCIN) 100 MG capsule Take 1 capsule (100 mg total) by mouth 2 (two) times daily., Starting Sat 11/03/2016, Print    guaiFENesin-codeine 100-10 MG/5ML syrup Take 5 mLs by mouth every 6 (  six) hours as needed for cough., Starting Sat 11/03/2016, Print       I personally performed the services described in this documentation, which was scribed in my presence. The recorded information has been reviewed and is accurate.       Roxy Horseman, PA-C 11/03/16 1801    Raeford Razor, MD 11/19/16 [phone]

## 2016-11-03 NOTE — ED Triage Notes (Signed)
PT reports being sick for 2 weeks and coughing up Black and green sputum. Pt does not have a fever today.

## 2017-01-01 ENCOUNTER — Emergency Department (HOSPITAL_COMMUNITY): Payer: BLUE CROSS/BLUE SHIELD

## 2017-01-01 ENCOUNTER — Emergency Department (HOSPITAL_COMMUNITY)
Admission: EM | Admit: 2017-01-01 | Discharge: 2017-01-01 | Disposition: A | Discharge status: Home / Self Care | Payer: BLUE CROSS/BLUE SHIELD | Attending: Emergency Medicine | Admitting: Emergency Medicine

## 2017-01-01 ENCOUNTER — Encounter (HOSPITAL_COMMUNITY): Payer: Self-pay | Admitting: *Deleted

## 2017-01-01 DIAGNOSIS — Z79899 Other long term (current) drug therapy: Secondary | ICD-10-CM | POA: Insufficient documentation

## 2017-01-01 DIAGNOSIS — J069 Acute upper respiratory infection, unspecified: Secondary | ICD-10-CM | POA: Insufficient documentation

## 2017-01-01 DIAGNOSIS — Z87891 Personal history of nicotine dependence: Secondary | ICD-10-CM | POA: Diagnosis not present

## 2017-01-01 DIAGNOSIS — R05 Cough: Secondary | ICD-10-CM | POA: Diagnosis present

## 2017-01-01 MED ORDER — ALBUTEROL SULFATE HFA 108 (90 BASE) MCG/ACT IN AERS
2.0000 | INHALATION_SPRAY | RESPIRATORY_TRACT | Status: DC | PRN
  Administered 2017-01-01: 2 via RESPIRATORY_TRACT
  Filled 2017-01-01: qty 6.7

## 2017-01-01 MED ORDER — HYDROCOD POLST-CPM POLST ER 10-8 MG/5ML PO SUER: 5.0000 mL | Freq: Every evening | ORAL | 0 refills | Status: DC | PRN

## 2017-01-01 NOTE — ED Triage Notes (Signed)
Pt reports cough, congestion, sore throat for 1 week.

## 2017-01-01 NOTE — ED Provider Notes (Signed)
[facility]-EMERGENCY DEPT Provider Note   CSN: [id number] Arrival date & time: 01/01/17  1423  By signing my name below, I, Marnette Burgess Long, attest that this documentation has been prepared under the direction and in the presence of Rolan Bucco, MD. Electronically Signed: Christian Mate, Scribe. 01/01/2017. 4:38 PM.  History   Chief Complaint Chief Complaint  Patient presents with  . Cough   The history is provided by the patient and medical records. No language interpreter was used.    HPI Comments:  Kelly Patton is a 46 y.o. female with no pertinent PMHx, who presents to the Emergency Department complaining of persistent, gradually worsening, dry cough onset one week. Pt reports one week ago, some rhinorrhea arising alongside a nonproductive, dry cough. Since onset, the cough has gradually worsened leading her to be seen in the ED today. Pt has associated symptoms of fever, congestion, SOB during coughing episodes, and generalized myalgias with 6/10 pain. She has tried Nyquil, Catering manager, Motrin, and Aleve at home with no relief of her symptoms. No sick contact with similar symptoms. Pt denies ear pain, leg swelling, and any other complaints at this time.   Past Medical History:  Diagnosis Date  . Carpal tunnel syndrome, right   . Shortness of breath dyspnea    with exertion uses inhaler   There are no active problems to display for this patient.  Past Surgical History:  Procedure Laterality Date  . CARPAL TUNNEL RELEASE Right 01/31/2016   Procedure: CARPAL TUNNEL RELEASE;  Surgeon: Cammy Copa, MD;  Location: [facility] OR;  Service: Orthopedics;  Laterality: Right;  . HAND TENDON SURGERY     right   OB History    No data available      Home Medications    Prior to Admission medications   Medication Sig Start Date End Date Taking? Authorizing Provider  albuterol (PROVENTIL HFA;VENTOLIN HFA) 108 (90 Base) MCG/ACT inhaler Inhale 1-2 puffs into the lungs every 6 (six)  hours as needed for wheezing or shortness of breath. 11/10/15   Danelle Berry, PA-C  benzonatate (TESSALON) 100 MG capsule Take 1 capsule (100 mg total) by mouth every 8 (eight) hours. 08/18/16   Liberty Handy, PA-C  benzonatate (TESSALON) 100 MG capsule Take 1 capsule (100 mg total) by mouth every 8 (eight) hours. 10/23/16   Shawn C Joy, PA-C  chlorpheniramine-HYDROcodone (TUSSIONEX PENNKINETIC ER) 10-8 MG/5ML SUER Take 5 mLs by mouth at bedtime as needed for cough. 01/01/17   Rolan Bucco, MD  doxycycline (VIBRAMYCIN) 100 MG capsule Take 1 capsule (100 mg total) by mouth 2 (two) times daily. 11/03/16   Roxy Horseman, PA-C  fluticasone (FLONASE) 50 MCG/ACT nasal spray Place 2 sprays into both nostrils daily. 08/18/16   Liberty Handy, PA-C  guaiFENesin (ROBITUSSIN) 100 MG/5ML liquid Take 5-10 mLs (100-200 mg total) by mouth every 4 (four) hours as needed for cough. 08/18/16   Liberty Handy, PA-C  guaiFENesin-codeine 100-10 MG/5ML syrup Take 5 mLs by mouth every 6 (six) hours as needed for cough. 11/03/16   Roxy Horseman, PA-C  ibuprofen (ADVIL,MOTRIN) 800 MG tablet Take 1 tablet (800 mg total) by mouth 3 (three) times daily. 10/23/16   Anselm Pancoast, PA-C    Family History Family History  Problem Relation Age of Onset  . Hypertension Father   . Hypertension Other     Social History Social History  Substance Use Topics  . Smoking status: Former Smoker    Packs/day: 0.50  Types: Cigarettes  . Smokeless tobacco: Never Used  . Alcohol use Yes     Comment: " once a week"     Allergies   Patient has no known allergies.   Review of Systems Review of Systems  Constitutional: Positive for fever. Negative for chills, diaphoresis and fatigue.  HENT: Positive for congestion, postnasal drip and rhinorrhea. Negative for ear pain and sneezing.   Eyes: Negative.   Respiratory: Positive for cough and shortness of breath. Negative for chest tightness.   Cardiovascular: Negative for chest  pain and leg swelling.  Gastrointestinal: Negative for abdominal pain, blood in stool, diarrhea, nausea and vomiting.  Genitourinary: Negative for difficulty urinating, flank pain, frequency and hematuria.  Musculoskeletal: Positive for myalgias. Negative for arthralgias and back pain.  Skin: Negative for rash.  Neurological: Negative for dizziness, speech difficulty, weakness, numbness and headaches.     Physical Exam Updated Vital Signs BP (!) 159/101 (BP Location: Left Arm)   Pulse 86   Temp 98.3 F (36.8 C) (Oral)   Resp 18   LMP 12/31/2016   SpO2 97%   Physical Exam  Constitutional: She is oriented to person, place, and time. She appears well-developed and well-nourished.  HENT:  Head: Normocephalic and atraumatic.  Right Ear: Tympanic membrane, external ear and ear canal normal.  Left Ear: Tympanic membrane, external ear and ear canal normal.  Mouth/Throat: Oropharynx is clear and moist. No oropharyngeal exudate.  Oropharynx clear, no erythema or exudates  Eyes: Pupils are equal, round, and reactive to light.  Neck: Normal range of motion. Neck supple.  Cardiovascular: Normal rate, regular rhythm and normal heart sounds.   Pulmonary/Chest: Effort normal. No respiratory distress. She has no wheezes. She exhibits no tenderness.  Bilateral rhonchi.   Abdominal: Soft. Bowel sounds are normal. There is no tenderness. There is no rebound and no guarding.  Musculoskeletal: Normal range of motion. She exhibits no edema.  Lymphadenopathy:    She has no cervical adenopathy.  Neurological: She is alert and oriented to person, place, and time.  Skin: Skin is warm and dry. No rash noted.  Psychiatric: She has a normal mood and affect.     ED Treatments / Results  DIAGNOSTIC STUDIES:  Oxygen Saturation is 97% on RA, normal by my interpretation.    COORDINATION OF CARE:  4:33 PM Discussed treatment plan with pt at bedside including CXR and pt agreed to plan.  5:37 PM Pt  re-evaluated with discussion on results of imaging.   Labs (all labs ordered are listed, but only abnormal results are displayed) Labs Reviewed - No data to display  EKG  EKG Interpretation None       Radiology Dg Chest 2 View  Result Date: 01/01/2017 CLINICAL DATA:  Mid chest pain when coughing since yesterday, shortness of breath EXAM: CHEST  2 VIEW COMPARISON:  11/03/2016 FINDINGS: Enlargement of cardiac silhouette. Mediastinal contours and pulmonary vascularity normal. Lungs clear. No pleural effusion or pneumothorax. Bones unremarkable. IMPRESSION: Enlargement of cardiac silhouette. No acute abnormalities. Electronically Signed   By: Ulyses Southward M.D.   On: 01/01/2017 17:29    Procedures Procedures (including critical care time)  Medications Ordered in ED Medications  albuterol (PROVENTIL HFA;VENTOLIN HFA) 108 (90 Base) MCG/ACT inhaler 2 puff (not administered)     Initial Impression / Assessment and Plan / ED Course  I have reviewed the triage vital signs and the nursing notes.  Pertinent labs & imaging results that were available during my care of the  patient were reviewed by me and considered in my medical decision making (see chart for details).    Patient presents with URI symptoms. Her lungs have some scarce wheezing and rhonchi. There is no evidence of pneumonia on x-ray. She's otherwise well-appearing. She was given a prescription for Tussionex cough syrup. She was encouraged to have follow-up with the PCP. Return precautions were given. She was advised that she does need to have her blood pressure rechecked by PCP.   Final Clinical Impressions(s) / ED Diagnoses   Final diagnoses:  Viral upper respiratory tract infection    New Prescriptions New Prescriptions   CHLORPHENIRAMINE-HYDROCODONE (TUSSIONEX PENNKINETIC ER) 10-8 MG/5ML SUER    Take 5 mLs by mouth at bedtime as needed for cough.    I personally performed the services described in this documentation,  which was scribed in my presence.  The recorded information has been reviewed and considered.     Rolan Bucco, MD 01/01/17 [phone]

## 2017-01-21 ENCOUNTER — Emergency Department (HOSPITAL_COMMUNITY)
Admission: EM | Admit: 2017-01-21 | Discharge: 2017-01-21 | Disposition: A | Discharge status: Home / Self Care | Payer: BLUE CROSS/BLUE SHIELD | Attending: Emergency Medicine | Admitting: Emergency Medicine

## 2017-01-21 ENCOUNTER — Encounter (HOSPITAL_COMMUNITY): Payer: Self-pay

## 2017-01-21 DIAGNOSIS — Z87891 Personal history of nicotine dependence: Secondary | ICD-10-CM | POA: Insufficient documentation

## 2017-01-21 DIAGNOSIS — R05 Cough: Secondary | ICD-10-CM | POA: Diagnosis present

## 2017-01-21 DIAGNOSIS — J069 Acute upper respiratory infection, unspecified: Secondary | ICD-10-CM | POA: Insufficient documentation

## 2017-01-21 DIAGNOSIS — Z79899 Other long term (current) drug therapy: Secondary | ICD-10-CM | POA: Diagnosis not present

## 2017-01-21 MED ORDER — BENZONATATE 100 MG PO CAPS: 200.0000 mg | ORAL_CAPSULE | Freq: Three times a day (TID) | ORAL | 0 refills | Status: DC

## 2017-01-21 MED ORDER — DOXYCYCLINE HYCLATE 100 MG PO CAPS: 100.0000 mg | ORAL_CAPSULE | Freq: Two times a day (BID) | ORAL | 0 refills | Status: DC

## 2017-01-21 MED ORDER — GUAIFENESIN-CODEINE 100-10 MG/5ML PO SOLN: 5.0000 mL | Freq: Four times a day (QID) | ORAL | 0 refills | Status: DC | PRN

## 2017-01-21 NOTE — Discharge Instructions (Signed)
You appear to have an upper respiratory infection (URI). An upper respiratory tract infection, or cold, is a viral infection of the air passages leading to the lungs. It is contagious and can be spread to others, especially during the first 3 or 4 days. It cannot be cured by antibiotics or other medicines. °RETURN IMMEDIATELY IF you develop shortness of breath, confusion or altered mental status, a new rash, become dizzy, faint, or poorly responsive, or are unable to be cared for at home. ° °

## 2017-01-21 NOTE — ED Triage Notes (Signed)
Pt reports she was here 3 weeks ago for cough. She reports she was prescribed cough medicine that she could not afford. Pt has had persistent cough. She reports she had meds prescribed in February that helped with her symptoms.

## 2017-01-21 NOTE — ED Notes (Signed)
EDP at bedside  

## 2017-01-21 NOTE — ED Provider Notes (Signed)
[facility]-EMERG[facility]Y DEPT Provider Note    By signing my name below, I, Earmon PhoenixJennifer Waddell, attest that this documentation has been prepared under the direction and in the presence of Arthor CaptainAbigail Brandy Kabat, PA-C. Electronically Signed: Earmon PhoenixJennifer Waddell, ED Scribe. 01/21/17. 9:58 AM.    History   Chief Complaint Chief Complaint  Patient presents with  . Cough   The history is provided by the patient and medical records. No language interpreter was used.    Kelly Patton is a 46 y.o. female who presents to the Emergency Department complaining of a dry cough that has began about three weeks ago. She reports associated back soreness due to coughing. She was seen here about three weeks ago and was prescribed Tussionex in which she could not afford. She states she was here three months ago and was prescribed Tessalon which helped her symptoms. She has taken Bronch-Aid for symptoms with temporary relief. She has been using an MDI with no significant relief. There are no modifying factors noted. She denies fever, chills, nausea, vomiting. She smokes occasionally. She denies h/o asthma.   Past Medical History:  Diagnosis Date  . Carpal tunnel syndrome, right   . Shortness of breath dyspnea    with exertion uses inhaler    There are no active problems to display for this patient.   Past Surgical History:  Procedure Laterality Date  . CARPAL TUNNEL RELEASE Right 01/31/2016   Procedure: CARPAL TUNNEL RELEASE;  Surgeon: Cammy CopaScott Gregory Dean, MD;  [facility];  Service: Orthopedics;  Laterality: Right;  . HAND TENDON SURGERY     right    OB History    No data available       Home Medications    Prior to Admission medications   Medication Sig Start Date End Date Taking? Authorizing Provider  albuterol (PROVENTIL HFA;VENTOLIN HFA) 108 (90 Base) MCG/ACT inhaler Inhale 1-2 puffs into the lungs every 6 (six) hours as needed for wheezing or shortness of breath. 11/10/15   Danelle Berryapia, Leisa, PA-C    benzonatate (TESSALON) 100 MG capsule Take 2 capsules (200 mg total) by mouth every 8 (eight) hours. 01/21/17   Arthor CaptainHarris, Mckaylie Vasey, PA-C  chlorpheniramine-HYDROcodone [facility](TUSSIONEX PENNKINETIC ER) 10-8 MG/5ML SUER Take 5 mLs by mouth at bedtime as needed for cough. 01/01/17   Rolan BuccoBelfi, Melanie, MD  doxycycline (VIBRAMYCIN) 100 MG capsule Take 1 capsule (100 mg total) by mouth 2 (two) times daily. 01/21/17   Nashon Erbes, PA-C  fluticasone (FLONASE) 50 MCG/ACT nasal spray Place 2 sprays into both nostrils daily. 08/18/16   Liberty HandyGibbons, Claudia J, PA-C  guaiFENesin (ROBITUSSIN) 100 MG/5ML liquid Take 5-10 mLs (100-200 mg total) by mouth every 4 (four) hours as needed for cough. 08/18/16   Liberty HandyGibbons, Claudia J, PA-C  guaiFENesin-codeine 100-10 MG/5ML syrup Take 5 mLs by mouth every 6 (six) hours as needed for cough. 01/21/17   Arthor CaptainHarris, Viyaan Champine, PA-C  ibuprofen (ADVIL,MOTRIN) 800 MG tablet Take 1 tablet (800 mg total) by mouth 3 (three) times daily. 10/23/16   Joy, Hillard DankerShawn C, PA-C    Family History Family History  Problem Relation Age of Onset  . Hypertension Father   . Hypertension Other     Social History Social History  Substance Use Topics  . Smoking status: Former Smoker    Packs/day: 0.50    Types: Cigarettes  . Smokeless tobacco: Never Used  . Alcohol use Yes     Comment: " once a week"     Allergies   Patient has no known  allergies.   Review of Systems Review of Systems  Constitutional: Negative for chills and fever.  Respiratory: Positive for cough.   Gastrointestinal: Negative for nausea and vomiting.  Musculoskeletal: Positive for myalgias.     Physical Exam Updated Vital Signs BP 123/87 (BP Location: Left Arm)   Pulse 95   Temp 99 F (37.2 C) (Oral)   Resp 16   LMP 12/31/2016   SpO2 98%   Physical Exam  Constitutional: She is oriented to person, place, and time. She appears well-developed and well-nourished.  HENT:  Head: Normocephalic and atraumatic.  Neck: Normal range of  motion.  Cardiovascular: Normal rate, regular rhythm and normal heart sounds.   Pulmonary/Chest: Effort normal and breath sounds normal. No respiratory distress. She has no wheezes. She has no rales.  Musculoskeletal: Normal range of motion.  Neurological: She is alert and oriented to person, place, and time.  Skin: Skin is warm and dry.  Psychiatric: She has a normal mood and affect. Her behavior is normal.  Nursing note and vitals reviewed.    ED Treatments / Results  DIAGNOSTIC STUDIES: Oxygen Saturation is 98% on RA, normal by my interpretation.   COORDINATION OF CARE: 9:54 AM- Will prescribe Guaifenesin with Codeine and Doxycycline. Pt verbalizes understanding and agrees to plan.  Medications - No data to display  Labs (all labs ordered are listed, but only abnormal results are displayed) Labs Reviewed - No data to display  EKG  EKG Interpretation None       Radiology No results found.  Procedures Procedures (including critical care time)  Medications Ordered in ED Medications - No data to display   Initial Impression / Assessment and Plan / ED Course  I have reviewed the triage vital signs and the nursing notes.  Pertinent labs & imaging results that were available during my care of the patient were reviewed by me and considered in my medical decision making (see chart for details).     Pt symptoms consistent with URI. Pt will be discharged with prescription for Guaifenesin w/ Codeine and a Doxycycline. Discussed return precautions. Pt is hemodynamically stable & in NAD prior to discharge.   Final Clinical Impressions(s) / ED Diagnoses   Final diagnoses:  Upper respiratory tract infection, unspecified type    New Prescriptions Discharge Medication List as of 01/21/2017 10:09 AM      I personally performed the services described in this documentation, which was scribed in my presence. The recorded information has been reviewed and is accurate.        Arthor Captain, PA-C 01/21/17 2045    Marily Memos, MD 01/26/17 [phone]

## 2017-03-03 ENCOUNTER — Encounter (HOSPITAL_COMMUNITY): Payer: Self-pay | Admitting: *Deleted

## 2017-03-03 ENCOUNTER — Emergency Department (HOSPITAL_COMMUNITY): Payer: BLUE CROSS/BLUE SHIELD

## 2017-03-03 ENCOUNTER — Emergency Department (HOSPITAL_COMMUNITY)
Admission: EM | Admit: 2017-03-03 | Discharge: 2017-03-03 | Disposition: A | Discharge status: Home / Self Care | Payer: BLUE CROSS/BLUE SHIELD | Attending: Emergency Medicine | Admitting: Emergency Medicine

## 2017-03-03 DIAGNOSIS — J40 Bronchitis, not specified as acute or chronic: Secondary | ICD-10-CM

## 2017-03-03 DIAGNOSIS — Z79899 Other long term (current) drug therapy: Secondary | ICD-10-CM | POA: Diagnosis not present

## 2017-03-03 DIAGNOSIS — R109 Unspecified abdominal pain: Secondary | ICD-10-CM | POA: Diagnosis not present

## 2017-03-03 DIAGNOSIS — R05 Cough: Secondary | ICD-10-CM

## 2017-03-03 DIAGNOSIS — R059 Cough, unspecified: Secondary | ICD-10-CM

## 2017-03-03 DIAGNOSIS — F17210 Nicotine dependence, cigarettes, uncomplicated: Secondary | ICD-10-CM | POA: Insufficient documentation

## 2017-03-03 DIAGNOSIS — M79644 Pain in right finger(s): Secondary | ICD-10-CM

## 2017-03-03 LAB — URINALYSIS, ROUTINE W REFLEX MICROSCOPIC
Bilirubin Urine: NEGATIVE
Glucose, UA: NEGATIVE mg/dL
Hgb urine dipstick: NEGATIVE
KETONES UR: NEGATIVE mg/dL
LEUKOCYTES UA: NEGATIVE
NITRITE: NEGATIVE
PROTEIN: NEGATIVE mg/dL
Specific Gravity, Urine: 1.018 (ref 1.005–1.030)
pH: 6 (ref 5.0–8.0)

## 2017-03-03 LAB — COMPREHENSIVE METABOLIC PANEL
ALT: 16 U/L (ref 14–54)
AST: 17 U/L (ref 15–41)
Albumin: 3.9 g/dL (ref 3.5–5.0)
Alkaline Phosphatase: 60 U/L (ref 38–126)
Anion gap: 7 (ref 5–15)
BUN: 10 mg/dL (ref 6–20)
CHLORIDE: 102 mmol/L (ref 101–111)
CO2: 27 mmol/L (ref 22–32)
CREATININE: 0.68 mg/dL (ref 0.44–1.00)
Calcium: 9.9 mg/dL (ref 8.9–10.3)
GFR calc Af Amer: >60 mL/min (ref >60)
Glucose, Bld: 98 mg/dL (ref 65–99)
POTASSIUM: 3.8 mmol/L (ref 3.5–5.1)
Sodium: 136 mmol/L (ref 135–145)
TOTAL PROTEIN: 6.9 g/dL (ref 6.5–8.1)
Total Bilirubin: 0.6 mg/dL (ref 0.3–1.2)

## 2017-03-03 LAB — I-STAT BETA HCG BLOOD, ED (MC, WL, AP ONLY)

## 2017-03-03 LAB — CBC
HCT: 38 % (ref 36.0–46.0)
Hemoglobin: 12.5 g/dL (ref 12.0–15.0)
MCH: 30 pg (ref 26.0–34.0)
MCHC: 32.9 g/dL (ref 30.0–36.0)
MCV: 91.1 fL (ref 78.0–100.0)
PLATELETS: 408 10*3/uL — AB (ref 150–400)
RBC: 4.17 MIL/uL (ref 3.87–5.11)
RDW: 12.9 % (ref 11.5–15.5)
WBC: 8.7 10*3/uL (ref 4.0–10.5)

## 2017-03-03 MED ORDER — BENZONATATE 100 MG PO CAPS: 200.0000 mg | ORAL_CAPSULE | Freq: Three times a day (TID) | ORAL | 0 refills | Status: DC

## 2017-03-03 MED ORDER — CETIRIZINE HCL 10 MG PO TABS: 10.0000 mg | ORAL_TABLET | Freq: Every day | ORAL | 0 refills | Status: DC

## 2017-03-03 MED ORDER — IPRATROPIUM-ALBUTEROL 0.5-2.5 (3) MG/3ML IN SOLN
3.0000 mL | Freq: Once | RESPIRATORY_TRACT | Status: AC
  Administered 2017-03-03: 3 mL via RESPIRATORY_TRACT
  Filled 2017-03-03: qty 3

## 2017-03-03 NOTE — Discharge Instructions (Signed)
Please take zyrtec and tessalon as needed. Please followup with primary doctor at first availability

## 2017-03-03 NOTE — ED Triage Notes (Signed)
Pt with multiple complaints.  Non-productive cough, chronic R thumb pain and lower abdominal pain.  Pt eating food in waiting room.

## 2017-03-03 NOTE — ED Provider Notes (Signed)
[facility] Provider Note   CSN: [id number] Arrival date & time: 03/03/17  1451     History   Chief Complaint Chief Complaint  Patient presents with  . Cough  . Hand Pain  . Abdominal Pain    HPI Kelly Patton is a 46 y.o. female.  The history is provided by the patient and medical records. No language interpreter was used.  Cough  This is a chronic problem. The current episode started more than 1 week ago. The problem occurs constantly. The problem has not changed since onset.The cough is non-productive. There has been no fever. Pertinent negatives include no chest pain, no chills, no ear pain, no sore throat and no shortness of breath.  Hand Pain  Associated symptoms include abdominal pain. Pertinent negatives include no chest pain and no shortness of breath.  Abdominal Pain   Pertinent negatives include fever, diarrhea, nausea, vomiting, dysuria, hematuria and arthralgias.    Past Medical History:  Diagnosis Date  . Carpal tunnel syndrome, right   . Shortness of breath dyspnea    with exertion uses inhaler    There are no active problems to display for this patient.   Past Surgical History:  Procedure Laterality Date  . CARPAL TUNNEL RELEASE Right 01/31/2016   Procedure: CARPAL TUNNEL RELEASE;  Surgeon: Cammy Copa, MD;  Location: [facility] OR;  Service: Orthopedics;  Laterality: Right;  . HAND TENDON SURGERY     right    OB History    No data available       Home Medications    Prior to Admission medications   Medication Sig Start Date End Date Taking? Authorizing Provider  albuterol (PROVENTIL HFA;VENTOLIN HFA) 108 (90 Base) MCG/ACT inhaler Inhale 1-2 puffs into the lungs every 6 (six) hours as needed for wheezing or shortness of breath. 11/10/15   Danelle Berry, PA-C  benzonatate (TESSALON) 100 MG capsule Take 2 capsules (200 mg total) by mouth every 8 (eight) hours. 03/03/17   Hebert Soho, MD  cetirizine (ZYRTEC ALLERGY) 10 MG tablet Take 1  tablet (10 mg total) by mouth daily. 03/03/17   Hebert Soho, MD  chlorpheniramine-HYDROcodone (TUSSIONEX PENNKINETIC ER) 10-8 MG/5ML SUER Take 5 mLs by mouth at bedtime as needed for cough. 01/01/17   Rolan Bucco, MD  doxycycline (VIBRAMYCIN) 100 MG capsule Take 1 capsule (100 mg total) by mouth 2 (two) times daily. 01/21/17   Harris, Abigail, PA-C  fluticasone (FLONASE) 50 MCG/ACT nasal spray Place 2 sprays into both nostrils daily. 08/18/16   Liberty Handy, PA-C  guaiFENesin (ROBITUSSIN) 100 MG/5ML liquid Take 5-10 mLs (100-200 mg total) by mouth every 4 (four) hours as needed for cough. 08/18/16   Liberty Handy, PA-C  guaiFENesin-codeine 100-10 MG/5ML syrup Take 5 mLs by mouth every 6 (six) hours as needed for cough. 01/21/17   Arthor Captain, PA-C  ibuprofen (ADVIL,MOTRIN) 800 MG tablet Take 1 tablet (800 mg total) by mouth 3 (three) times daily. 10/23/16   Joy, Hillard Danker, PA-C    Family History Family History  Problem Relation Age of Onset  . Hypertension Father   . Hypertension Other     Social History Social History  Substance Use Topics  . Smoking status: Current Some Day Smoker    Types: Cigarettes  . Smokeless tobacco: Never Used  . Alcohol use Yes     Comment: " once a week"     Allergies   Patient has no known allergies.   Review of Systems  Review of Systems  Constitutional: Negative for chills and fever.  HENT: Negative for ear pain and sore throat.   Eyes: Negative for pain and visual disturbance.  Respiratory: Positive for cough. Negative for shortness of breath.   Cardiovascular: Negative for chest pain and palpitations.  Gastrointestinal: Positive for abdominal pain. Negative for diarrhea, nausea and vomiting.  Genitourinary: Negative for dysuria, hematuria, vaginal bleeding and vaginal discharge.  Musculoskeletal: Negative for arthralgias and back pain.       Positive for R thumb decreased ROM  Skin: Negative for color change and rash.  Neurological:  Negative for seizures and syncope.  All other systems reviewed and are negative.    Physical Exam Updated Vital Signs BP (!) 177/110   Pulse 87   Temp 98.3 F (36.8 C) (Oral)   Resp 16   Ht 5' 2.5" (1.588 m)   Wt 73.9 kg (163 lb)   LMP 02/12/2017   SpO2 100%   BMI 29.34 kg/m   Physical Exam  Constitutional: She appears well-developed. No distress.  HENT:  Head: Normocephalic and atraumatic.  Eyes: Conjunctivae are normal.  Neck: Neck supple.  Cardiovascular: Normal rate and regular rhythm.   No murmur heard. Pulmonary/Chest: Effort normal and breath sounds normal. No respiratory distress. She has no wheezes. She has no rales.  Abdominal: Soft. There is no tenderness.  Musculoskeletal: She exhibits no edema.  Intact motor function of R thumb, able to oppose each finger, make OK sign, and give thumbs up. Pt concerned about being unable to fully touch palm of hand with her R thumb.   Neurological: She is alert. No cranial nerve deficit. Coordination normal.  5/5 motor strength and intact sensation in all extremities. Finger-to-nose intact bilaterally  Skin: Skin is warm and dry.  Nursing note and vitals reviewed.    ED Treatments / Results  Labs (all labs ordered are listed, but only abnormal results are displayed) Labs Reviewed  CBC - Abnormal; Notable for the following:       Result Value   Platelets 408 (*)    All other components within normal limits  COMPREHENSIVE METABOLIC PANEL  URINALYSIS, ROUTINE W REFLEX MICROSCOPIC  I-STAT BETA HCG BLOOD, ED ([facility], [facility] ONLY)    EKG  EKG Interpretation None       Radiology Dg Chest 2 View  Result Date: 03/03/2017 CLINICAL DATA:  Productive cough. EXAM: CHEST  2 VIEW COMPARISON:  01/01/2017 FINDINGS: The heart size and mediastinal contours are within normal limits. Both lungs are clear except for slight peribronchial thickening on the lateral view. The visualized skeletal structures are unremarkable. IMPRESSION:  Slight bronchitic changes. Electronically Signed   By: Francene BoyersJames  Maxwell M.D.   On: 03/03/2017 16:13    Procedures Procedures (including critical care time)  Medications Ordered in ED Medications  ipratropium-albuterol (DUONEB) 0.5-2.5 (3) MG/3ML nebulizer solution 3 mL (3 mLs Nebulization Given 03/03/17 1709)     Initial Impression / Assessment and Plan / ED Course  I have reviewed the triage vital signs and the nursing notes.  Pertinent labs & imaging results that were available during my care of the patient were reviewed by me and considered in my medical decision making (see chart for details).     46 year old female who presents with multiple complaints.  She states 3 weeks of nonproductive cough and request an antibiotic.  She reports sleeping with air-conditioning and fan on that made the cough worse. Also works in [employer]. Denies lisinopril use. She also  describes one year of intermittent bilateral lower quadrant abdominal cramping. Denies dysuria, vaginal discharge or bleeding. Does drink EtOH 2-3x per week and smokes. Denies abdominal cramping association with food. No vomiting or diarrhea. Last endorses R thumb limited ROM. She feels she cannot touch the palm of her hand with her R thumb as she normally does with her L thumb. No recent falls or known injury.  AF, VSS. Lungs CTAB. Abdomen soft, benign throughout.   CXR showing slight bronchitic changes. CBC, BMP unremarkable. UA w/o evidence of UTI.  Spoke with patient regarding lack of evidence for antibiotic use. No evidence of PNA. No fevers. Provided zyrtec and tessalon pearls. She will continued supportive care. Instructed patient on smoking cessation. No evidence of R thumb abnormality. Abdominal exam benign with reassuring labs. No indication for imaging given chronic nature of symptoms. She is stable for continued outpatient followup.   Return precautions provided for worsening symptoms. Pt will f/u with PCP at first  availability. Pt verbalized agreement with plan.  Pt care d/w Dr. Clarene Duke  Final Clinical Impressions(s) / ED Diagnoses   Final diagnoses:  Bronchitis  Cough  Pain of right thumb    New Prescriptions Discharge Medication List as of 03/03/2017  6:55 PM    START taking these medications   Details  cetirizine (ZYRTEC ALLERGY) 10 MG tablet Take 1 tablet (10 mg total) by mouth daily., Starting Sun 03/03/2017, Print         Hebert Soho, MD 03/04/17 0354    Clarene Duke, Ambrose Finland, MD 03/04/17 [phone]

## 2017-03-25 ENCOUNTER — Ambulatory Visit: Payer: BLUE CROSS/BLUE SHIELD | Admitting: Family Medicine

## 2017-04-08 ENCOUNTER — Ambulatory Visit (INDEPENDENT_AMBULATORY_CARE_PROVIDER_SITE_OTHER): Payer: BLUE CROSS/BLUE SHIELD | Admitting: Family Medicine

## 2017-04-08 ENCOUNTER — Encounter: Payer: Self-pay | Admitting: Family Medicine

## 2017-04-08 ENCOUNTER — Ambulatory Visit (INDEPENDENT_AMBULATORY_CARE_PROVIDER_SITE_OTHER)
Admission: RE | Admit: 2017-04-08 | Discharge: 2017-04-08 | Disposition: A | Discharge status: Home / Self Care | Payer: BLUE CROSS/BLUE SHIELD | Source: Ambulatory Visit | Attending: Family Medicine | Admitting: Family Medicine

## 2017-04-08 VITALS — BP 112/90 | HR 96 | Temp 98.5°F | Ht 63.0 in | Wt 164.0 lb

## 2017-04-08 DIAGNOSIS — Z7689 Persons encountering health services in other specified circumstances: Secondary | ICD-10-CM | POA: Diagnosis not present

## 2017-04-08 DIAGNOSIS — M79644 Pain in right finger(s): Secondary | ICD-10-CM

## 2017-04-08 MED ORDER — DICLOFENAC SODIUM 75 MG PO TBEC: 75.0000 mg | DELAYED_RELEASE_TABLET | Freq: Two times a day (BID) | ORAL | 0 refills | Status: DC

## 2017-04-08 NOTE — Patient Instructions (Addendum)
Please go to [facility] - located [address] across [address] from [facility] - in the basement - Hours: 8:30-5:30 PM M-F. Do not need appointment.    Please schedule a physical with lab work at [facility].   Treatment will be determined after review of X-ray.

## 2017-04-08 NOTE — Progress Notes (Signed)
Patient ID: Kelly Patton, female   DOB: Jun 13, 1971, 46 y.o.   MRN: [id number]  Patient presents to clinic today to establish care.  Acute Concerns:  Right thumb pain has been present for over 2 months. Associated inability to flex the thumb for over 2 months. No trigger that she can report that started pain. No known injury. Pain is described as pressure and aching and rated as a 7. 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 She denies radiation of pain, numbness, tingling, change in sensation, or weakness.  She has ibuprofen 800 mg BID that she obtained from a friend that provides moderate benefit.  She reports rarely seeking health care and does so through the ED when needed. She states this was due to lack of insurance however she has obtained health insurance and is seeking to establish care.  History of carpal tunnel syndrome of her right hand which was treated with surgery in 2017. Carpal tunnel release (right) 01/31/16 and history of hand tendon surgery (right).  She reports rare use of cigarettes smoking 1 cigarette/week for 2 years however also reports a history of smoking for 10 years from ages 20 to 47 with maximum amount of cigarettes noted as 5 cigarettes/day. She reports smoking marijuana on an occasional basis however does not report what she means by occasional. She also drinks a fifth of liquor/week  Health Maintenance: Dental --She does not seek regular dental care Vision --She does not seek regular eye exam Immunizations -- She does obtain regular influenza vaccines; Tdap is due Colonoscopy -- Not needed Mammogram --She is due; never had one PAP -- Reports years ago; she is due for this at this time    Past Medical History:  Diagnosis Date  . Carpal tunnel syndrome, right   . Shortness of breath dyspnea    with exertion uses inhaler    Past Surgical History:  Procedure Laterality Date  . CARPAL TUNNEL RELEASE Right 01/31/2016   Procedure: CARPAL TUNNEL RELEASE;  Surgeon: Meredith Pel, MD;  Location: [facility];  Service: Orthopedics;  Laterality: Right;  . HAND TENDON SURGERY     right    Current Outpatient Prescriptions on File Prior to Visit  Medication Sig Dispense Refill  . albuterol (PROVENTIL HFA;VENTOLIN HFA) 108 (90 Base) MCG/ACT inhaler Inhale 1-2 puffs into the lungs every 6 (six) hours as needed for wheezing or shortness of breath. 1 Inhaler 0  . cetirizine (ZYRTEC ALLERGY) 10 MG tablet Take 1 tablet (10 mg total) by mouth daily. 15 tablet 0  . chlorpheniramine-HYDROcodone (TUSSIONEX PENNKINETIC ER) 10-8 MG/5ML SUER Take 5 mLs by mouth at bedtime as needed for cough. 140 mL 0  . fluticasone (FLONASE) 50 MCG/ACT nasal spray Place 2 sprays into both nostrils daily. 16 g 0  . guaiFENesin (ROBITUSSIN) 100 MG/5ML liquid Take 5-10 mLs (100-200 mg total) by mouth every 4 (four) hours as needed for cough. 60 mL 0  . guaiFENesin-codeine 100-10 MG/5ML syrup Take 5 mLs by mouth every 6 (six) hours as needed for cough. 120 mL 0  . ibuprofen (ADVIL,MOTRIN) 800 MG tablet Take 1 tablet (800 mg total) by mouth 3 (three) times daily. 21 tablet 0  . [DISCONTINUED] QUEtiapine (SEROQUEL) 50 MG tablet Take 150 mg by mouth at bedtime.     No current facility-administered medications on file prior to visit.     No Known Allergies  Family History  Problem Relation Age of Onset  .  Hypertension Father   . Hypertension Other     Social History   Social History  . Marital status: Single    Spouse name: N/A  . Number of children: N/A  . Years of education: N/A   Occupational History  . Not on file.   Social History Main Topics  . Smoking status: Current Some Day Smoker    Types: Cigarettes  . Smokeless tobacco: Never Used  . Alcohol use Yes     Comment: " once a week"  . Drug use: Yes    Types: Marijuana     Comment: occasionally  . Sexual activity: Yes    Birth control/ protection: None   Other Topics  Concern  . Not on file   Social History Narrative  . No narrative on file    Review of Systems  Constitutional: Negative for chills, fever, malaise/fatigue and weight loss.  Respiratory: Negative for cough and shortness of breath.   Cardiovascular: Negative for chest pain, palpitations, leg swelling and PND.  Gastrointestinal: Negative for abdominal pain, constipation, diarrhea, heartburn, nausea and vomiting.  Genitourinary: Negative for dysuria, frequency and urgency.  Musculoskeletal: Negative for myalgias.       Right thumb pain  Skin: Negative for rash.  Neurological: Negative for dizziness, tingling, tremors and headaches.  Psychiatric/Behavioral:       Denies depressed or anxious mood.    BP 112/90 (BP Location: Left Arm, Patient Position: Sitting, Cuff Size: Normal)   Pulse 96   Temp 98.5 F (36.9 C) (Oral)   Ht 5' 3" (1.6 m)   Wt 164 lb (74.4 kg)   LMP 03/14/2017 (Exact Date)   SpO2 98%   BMI 29.05 kg/m   Physical Exam  Constitutional: She is oriented to person, place, and time and well-developed, well-nourished, and in no distress.  Eyes: Pupils are equal, round, and reactive to light. No scleral icterus.  Neck: Neck supple.  Cardiovascular: Normal rate and regular rhythm.   Pulmonary/Chest: Effort normal and breath sounds normal. She has no wheezes. She has no rales.  Abdominal: Soft. Bowel sounds are normal. There is no tenderness.  Musculoskeletal:  Right thumb TTP at MCP joint; patient cannot flex thumb; with passive flexion, no tenderness reported. No edema or erythema noted. Capillary refill <2 seconds.  Lymphadenopathy:    She has no cervical adenopathy.  Neurological: She is alert and oriented to person, place, and time. Gait normal.  Skin: Skin is warm and dry. No rash noted.  Psychiatric: Mood, memory, affect and judgment normal.    Recent Results (from the past 2160 hour(s))  Comprehensive metabolic panel     Status: None   Collection Time:  03/03/17  3:08 PM  Result Value Ref Range   Sodium 136 135 - 145 mmol/L   Potassium 3.8 3.5 - 5.1 mmol/L   Chloride 102 101 - 111 mmol/L   CO2 27 22 - 32 mmol/L   Glucose, Bld 98 65 - 99 mg/dL   BUN 10 6 - 20 mg/dL   Creatinine, Ser 0.68 0.44 - 1.00 mg/dL   Calcium 9.9 8.9 - 10.3 mg/dL   Total Protein 6.9 6.5 - 8.1 g/dL   Albumin 3.9 3.5 - 5.0 g/dL   AST 17 15 - 41 U/L   ALT 16 14 - 54 U/L   Alkaline Phosphatase 60 38 - 126 U/L   Total Bilirubin 0.6 0.3 - 1.2 mg/dL   GFR calc non Af Amer >60 >60 mL/min   GFR calc  Af Amer >60 >60 mL/min    Comment: (NOTE) The eGFR has been calculated using the CKD EPI equation. This calculation has not been validated in all clinical situations. eGFR's persistently <60 mL/min signify possible Chronic Kidney Disease.    Anion gap 7 5 - 15  CBC     Status: Abnormal   Collection Time: 03/03/17  3:08 PM  Result Value Ref Range   WBC 8.7 4.0 - 10.5 K/uL   RBC 4.17 3.87 - 5.11 MIL/uL   Hemoglobin 12.5 12.0 - 15.0 g/dL   HCT 38.0 36.0 - 46.0 %   MCV 91.1 78.0 - 100.0 fL   MCH 30.0 26.0 - 34.0 pg   MCHC 32.9 30.0 - 36.0 g/dL   RDW 12.9 11.5 - 15.5 %   Platelets 408 (H) 150 - 400 K/uL  I-Stat Beta hCG blood, ED ([facility], [facility], [facility] only)     Status: None   Collection Time: 03/03/17  3:11 PM  Result Value Ref Range   I-stat hCG, quantitative <5.0 <5 mIU/mL   Comment 3            Comment:   GEST. AGE      CONC.  (mIU/mL)   <=1 WEEK        5 - 50     2 WEEKS       50 - 500     3 WEEKS       100 - 10,000     4 WEEKS     1,000 - 30,000        FEMALE AND NON-PREGNANT FEMALE:     LESS THAN 5 mIU/mL   Urinalysis, Routine w reflex microscopic     Status: None   Collection Time: 03/03/17  6:19 PM  Result Value Ref Range   Color, Urine YELLOW YELLOW   APPearance CLEAR CLEAR   Specific Gravity, Urine 1.018 1.005 - 1.030   pH 6.0 5.0 - 8.0   Glucose, UA NEGATIVE NEGATIVE mg/dL   Hgb urine dipstick NEGATIVE NEGATIVE   Bilirubin Urine NEGATIVE NEGATIVE    Ketones, ur NEGATIVE NEGATIVE mg/dL   Protein, ur NEGATIVE NEGATIVE mg/dL   Nitrite NEGATIVE NEGATIVE   Leukocytes, UA NEGATIVE NEGATIVE    Assessment/Plan:  1. Thumb pain, right Pain with gripping and inability to flex thumb; will obtain imaging for further evaluation and treatment. Advised antiinflammatory with food and further plan of care will be determined after imaging. - diclofenac (VOLTAREN) 75 MG EC tablet; Take 1 tablet (75 mg total) by mouth 2 (two) times daily.  Dispense: 30 tablet; Refill: 0 - DG Finger Thumb Right; Future  2. Encounter to establish care We reviewed the PMH, PSH, FH, SH, Meds and Allergies. -We provided refills for any medications we will prescribe as needed. -We addressed current concerns per orders and patient instructions. -We have asked for records for pertinent exams, studies, vaccines and notes from previous providers. -We have advised patient to follow up per instructions below.   -Patient advised to return or notify a provider immediately if symptoms worsen or persist or new concerns arise.   Advised patient to schedule a physical and lab work for preventive care and needed screenings. Patient declined Tdap today and states that she will obtain this with follow up visit.  Delano Metz, FNP-C

## 2017-04-29 ENCOUNTER — Ambulatory Visit: Payer: BLUE CROSS/BLUE SHIELD | Admitting: Family Medicine

## 2017-04-29 ENCOUNTER — Telehealth (INDEPENDENT_AMBULATORY_CARE_PROVIDER_SITE_OTHER): Payer: Self-pay | Admitting: Orthopedic Surgery

## 2017-04-29 NOTE — Telephone Encounter (Signed)
Returned call to patient left message to call back. 

## 2017-05-01 ENCOUNTER — Encounter (INDEPENDENT_AMBULATORY_CARE_PROVIDER_SITE_OTHER): Payer: Self-pay | Admitting: Orthopedic Surgery

## 2017-05-01 ENCOUNTER — Ambulatory Visit (INDEPENDENT_AMBULATORY_CARE_PROVIDER_SITE_OTHER): Payer: BLUE CROSS/BLUE SHIELD | Admitting: Orthopedic Surgery

## 2017-05-01 DIAGNOSIS — M65311 Trigger thumb, right thumb: Secondary | ICD-10-CM

## 2017-05-01 MED ORDER — OXYCODONE-ACETAMINOPHEN 5-325 MG PO TABS: 1.0000 | ORAL_TABLET | Freq: Two times a day (BID) | ORAL | 0 refills | Status: DC | PRN

## 2017-05-03 ENCOUNTER — Ambulatory Visit: Payer: BLUE CROSS/BLUE SHIELD | Admitting: Family Medicine

## 2017-05-05 DIAGNOSIS — M65311 Trigger thumb, right thumb: Secondary | ICD-10-CM

## 2017-05-05 MED ORDER — LIDOCAINE HCL 1 % IJ SOLN
1.0000 mL | INTRAMUSCULAR | Status: AC | PRN
  Administered 2017-05-05: 1 mL

## 2017-05-05 MED ORDER — BUPIVACAINE HCL 0.25 % IJ SOLN
0.3300 mL | INTRAMUSCULAR | Status: AC | PRN
  Administered 2017-05-05: .33 mL

## 2017-05-05 MED ORDER — METHYLPREDNISOLONE ACETATE 40 MG/ML IJ SUSP
13.3300 mg | INTRAMUSCULAR | Status: AC | PRN
  Administered 2017-05-05: 13.33 mg

## 2017-05-05 NOTE — Progress Notes (Signed)
Office Visit Note   Patient: Kelly Patton           Date of Birth: [birth year]           MRN: [id number] Visit Date: 05/01/2017 Requested by: No referring provider defined for this encounter. PCP: Default, Provider, MD  Subjective: Chief Complaint  Patient presents with  . Hand Pain    right thumb pain    HPI: : Is a 45 show patient with right hand and thumb pain.  She went to the emergency room 2 months ago.  Reports a lot of pain in the volar aspect of the right thumb.  Unable to bend the thumb.  She does describe discrete history of triggering for 3 months.  She states it's painful for her to open can food or a box of food.  She is taking a nonsteroidal.  She works at [employer] doing well from her carpal tunnel release over a year ago.              ROS: All systems reviewed are negative as they relate to the chief complaint within the history of present illness.  Patient denies  fevers or chills.   Assessment & Plan: Visit Diagnoses: No diagnosis found.  Plan: Impression is very symptomatic right trigger thumb.  Plan is ultrasound-guided injection to in between the A-1 pulley and tendon sheath.  One time prescription for oxycodone written.  She may very well need to get this released in order to achieve a functional range of motion but currently she has to work.  I'll see her back and she wants to get this released.  Follow-Up Instructions: Return if symptoms worsen or fail to improve.   Orders:  No orders of the defined types were placed in this encounter.  Meds ordered this encounter  Medications  . oxyCODONE-acetaminophen (PERCOCET/ROXICET) 5-325 MG tablet    Sig: Take 1 tablet by mouth every 12 (twelve) hours as needed for severe pain.    Dispense:  15 tablet    Refill:  0      Procedures: Hand/UE Inj Date/Time: 05/05/2017 10:09 PM Performed by: Cammy Copa Authorized by: Cammy Copa   Consent Given by:  Patient Site  marked: the procedure site was marked   Timeout: prior to procedure the correct patient, procedure, and site was verified   Indications:  Therapeutic Condition: trigger finger   Location:  Thumb Site:  R thumb A1 Prep: patient was prepped and draped in usual sterile fashion   Needle Size:  25 G Approach:  Volar Medications:  1 mL lidocaine 1 %; 0.33 mL bupivacaine 0.25 %; 13.33 mg methylPREDNISolone acetate 40 MG/ML Patient tolerance:  Patient tolerated the procedure well with no immediate complications     Clinical Data: No additional findings.  Objective: Vital Signs: There were no vitals taken for this visit.  Physical Exam:   Constitutional: Patient appears well-developed HEENT:  Head: Normocephalic Eyes:EOM are normal Neck: Normal range of motion Cardiovascular: Normal rate Pulmonary/chest: Effort normal Neurologic: Patient is alert Skin: Skin is warm Psychiatric: Patient has normal mood and affect    Ortho Exam: Orthopedic exam demonstrates intact EPL FPL function.  Ulnar collateral and radial collateral ligaments intact.  No CMC arthritis is present on grind testing.  No tenderness in the snuffbox and no tenderness over the first dorsal compartment of that right hand.  Other fingers have full composite flexion and extension.  The right thumb has very difficult  time with flexion past 30.  Exquisite tenderness noted over the A1 pulley of the right thumb no evidence of infection  Specialty Comments:  No specialty comments available.  Imaging: No results found.   PMFS History: Patient Active Problem List   Diagnosis Date Noted  . Thumb pain, right 04/08/2017   Past Medical History:  Diagnosis Date  . Carpal tunnel syndrome, right   . Shortness of breath dyspnea    with exertion uses inhaler    Family History  Problem Relation Age of Onset  . Hypertension Father   . Hypertension Other     Past Surgical History:  Procedure Laterality Date  . CARPAL  TUNNEL RELEASE Right 01/31/2016   Procedure: CARPAL TUNNEL RELEASE;  Surgeon: Cammy Copa, MD;  Location: [facility] OR;  Service: Orthopedics;  Laterality: Right;  . HAND TENDON SURGERY     right   Social History   Occupational History  . Not on file.   Social History Main Topics  . Smoking status: Current Some Day Smoker    Types: Cigarettes  . Smokeless tobacco: Never Used     Comment: Smokes 1 cigarette/week for 2 years  . Alcohol use Yes     Comment: once a week; fifth of liquor  . Drug use: Yes    Types: Marijuana     Comment: occasionally  . Sexual activity: Yes    Birth control/ protection: None

## 2017-05-14 ENCOUNTER — Telehealth (INDEPENDENT_AMBULATORY_CARE_PROVIDER_SITE_OTHER): Payer: Self-pay | Admitting: Radiology

## 2017-05-14 NOTE — Telephone Encounter (Signed)
Okay for one refill only. Please call findings. No more refills. Can't really continue pain medicine indefinitely.

## 2017-05-14 NOTE — Telephone Encounter (Signed)
Patient state that she needs a refill on her pain meds, states that Pharm only gave her 7 day supply of it, so she is needing to get another refill (the remainder of what she was to get). Also she states that the injection that was done has helped her.

## 2017-05-14 NOTE — Telephone Encounter (Signed)
Please advise. Thanks.  

## 2017-05-15 MED ORDER — OXYCODONE-ACETAMINOPHEN 5-325 MG PO TABS: 1.0000 | ORAL_TABLET | Freq: Two times a day (BID) | ORAL | 0 refills | Status: DC | PRN

## 2017-05-15 NOTE — Addendum Note (Signed)
Addended byPrescott Parma: Tyrica Afzal on: 05/15/2017 08:43 AM   Modules accepted: Orders

## 2017-05-15 NOTE — Telephone Encounter (Signed)
rx written and put up front for patient to pick up. This is last refill per Dr August Saucerean. Tried calling patient to advise, no answer, voicemail is full, unable to leave message.

## 2017-06-03 ENCOUNTER — Ambulatory Visit: Payer: BLUE CROSS/BLUE SHIELD | Admitting: Family Medicine

## 2017-06-04 ENCOUNTER — Ambulatory Visit (INDEPENDENT_AMBULATORY_CARE_PROVIDER_SITE_OTHER): Payer: BLUE CROSS/BLUE SHIELD | Admitting: Family Medicine

## 2017-06-04 ENCOUNTER — Encounter: Payer: Self-pay | Admitting: Family Medicine

## 2017-06-04 VITALS — BP 140/100 | HR 83 | Ht 63.0 in | Wt 170.1 lb

## 2017-06-04 DIAGNOSIS — I10 Essential (primary) hypertension: Secondary | ICD-10-CM | POA: Diagnosis not present

## 2017-06-04 DIAGNOSIS — J3089 Other allergic rhinitis: Secondary | ICD-10-CM

## 2017-06-04 DIAGNOSIS — R05 Cough: Secondary | ICD-10-CM

## 2017-06-04 DIAGNOSIS — R059 Cough, unspecified: Secondary | ICD-10-CM

## 2017-06-04 MED ORDER — CHLORTHALIDONE 25 MG PO TABS: 25.0000 mg | ORAL_TABLET | Freq: Every day | ORAL | 1 refills | Status: DC

## 2017-06-04 MED ORDER — BLOOD PRESSURE CUFF MISC: 0 refills | Status: DC

## 2017-06-04 MED ORDER — BENZONATATE 200 MG PO CAPS: 200.0000 mg | ORAL_CAPSULE | Freq: Three times a day (TID) | ORAL | 0 refills | Status: DC | PRN

## 2017-06-04 MED ORDER — CETIRIZINE HCL 10 MG PO TABS: 10.0000 mg | ORAL_TABLET | Freq: Every day | ORAL | 2 refills | Status: DC

## 2017-06-04 NOTE — Patient Instructions (Addendum)
Allergic Rhinitis Allergic rhinitis is when the mucous membranes in the nose respond to allergens. Allergens are particles in the air that cause your body to have an allergic reaction. This causes you to release allergic antibodies. Through a chain of events, these eventually cause you to release histamine into the blood stream. Although meant to protect the body, it is this release of histamine that causes your discomfort, such as frequent sneezing, congestion, and an itchy, runny nose. What are the causes? Seasonal allergic rhinitis (hay fever) is caused by pollen allergens that may come from grasses, trees, and weeds. Year-round allergic rhinitis (perennial allergic rhinitis) is caused by allergens such as house dust mites, pet dander, and mold spores. What are the signs or symptoms?  Nasal stuffiness (congestion).  Itchy, runny nose with sneezing and tearing of the eyes. How is this diagnosed? Your health care provider can help you determine the allergen or allergens that trigger your symptoms. If you and your health care provider are unable to determine the allergen, skin or blood testing may be used. Your health care provider will diagnose your condition after taking your health history and performing a physical exam. Your health care provider may assess you for other related conditions, such as asthma, pink eye, or an ear infection. How is this treated? Allergic rhinitis does not have a cure, but it can be controlled by:  Medicines that block allergy symptoms. These may include allergy shots, nasal sprays, and oral antihistamines.  Avoiding the allergen.  Hay fever may often be treated with antihistamines in pill or nasal spray forms. Antihistamines block the effects of histamine. There are over-the-counter medicines that may help with nasal congestion and swelling around the eyes. Check with your health care provider before taking or giving this medicine. If avoiding the allergen or the  medicine prescribed do not work, there are many new medicines your health care provider can prescribe. Stronger medicine may be used if initial measures are ineffective. Desensitizing injections can be used if medicine and avoidance does not work. Desensitization is when a patient is given ongoing shots until the body becomes less sensitive to the allergen. Make sure you follow up with your health care provider if problems continue. Follow these instructions at home: It is not possible to completely avoid allergens, but you can reduce your symptoms by taking steps to limit your exposure to them. It helps to know exactly what you are allergic to so that you can avoid your specific triggers. Contact a health care provider if:  You have a fever.  You develop a cough that does not stop easily (persistent).  You have shortness of breath.  You start wheezing.  Symptoms interfere with normal daily activities. This information is not intended to replace advice given to you by your health care provider. Make sure you discuss any questions you have with your health care provider. Document Released: 05/22/2001 Document Revised: 04/27/2016 Document Reviewed: 05/04/2013 Elsevier Interactive Patient Education  2017 [employer]  Cough, Adult A cough helps to clear your throat and lungs. A cough may last only 2-3 weeks (acute), or it may last longer than 8 weeks (chronic). Many different things can cause a cough. A cough may be a sign of an illness or another medical condition. Follow these instructions at home:  Pay attention to any changes in your cough.  Take medicines only as told by your doctor. ? If you were prescribed an antibiotic medicine, take it as told by your doctor. Do  not stop taking it even if you start to feel better. ? Talk with your doctor before you try using a cough medicine.  Drink enough fluid to keep your pee (urine) clear or pale yellow.  If the air is dry, use a cold steam  vaporizer or humidifier in your home.  Stay away from things that make you cough at work or at home.  If your cough is worse at night, try using extra pillows to raise your head up higher while you sleep.  Do not smoke, and try not to be around smoke. If you need help quitting, ask your doctor.  Do not have caffeine.  Do not drink alcohol.  Rest as needed. Contact a doctor if:  You have new problems (symptoms).  You cough up yellow fluid (pus).  Your cough does not get better after 2-3 weeks, or your cough gets worse.  Medicine does not help your cough and you are not sleeping well.  You have pain that gets worse or pain that is not helped with medicine.  You have a fever.  You are losing weight and you do not know why.  You have night sweats. Get help right away if:  You cough up blood.  You have trouble breathing.  Your heartbeat is very fast. This information is not intended to replace advice given to you by your health care provider. Make sure you discuss any questions you have with your health care provider. Document Released: 05/10/2011 Document Revised: 02/02/2016 Document Reviewed: 11/03/2014 Elsevier Interactive Patient Education  2018 [employer].  How to Take Your Blood Pressure You can take your blood pressure at home with a machine. You may need to check your blood pressure at home:  To check if you have high blood pressure (hypertension).  To check your blood pressure over time.  To make sure your blood pressure medicine is working.  Supplies needed: You will need a blood pressure machine, or monitor. You can buy one at a drugstore or online. When choosing one:  Choose one with an arm cuff.  Choose one that wraps around your upper arm. Only one finger should fit between your arm and the cuff.  Do not choose one that measures your blood pressure from your wrist or finger.  Your doctor can suggest a monitor. How to prepare Avoid these things  for 30 minutes before checking your blood pressure:  Drinking caffeine.  Drinking alcohol.  Eating.  Smoking.  Exercising.  Five minutes before checking your blood pressure:  Pee.  Sit in a dining chair. Avoid sitting in a soft couch or armchair.  Be quiet. Do not talk.  How to take your blood pressure Follow the instructions that came with your machine. If you have a digital blood pressure monitor, these may be the instructions: 1. Sit up straight. 2. Place your feet on the floor. Do not cross your ankles or legs. 3. Rest your left arm at the level of your heart. You may rest it on a table, desk, or chair. 4. Pull up your shirt sleeve. 5. Wrap the blood pressure cuff around the upper part of your left arm. The cuff should be 1 inch (2.5 cm) above your elbow. It is best to wrap the cuff around bare skin. 6. Fit the cuff snugly around your arm. You should be able to place only one finger between the cuff and your arm. 7. Put the cord inside the groove of your elbow. 8. Press the power  button. 9. Sit quietly while the cuff fills with air and loses air. 10. Write down the numbers on the screen. 11. Wait 2-3 minutes and then repeat steps 1-10.  What do the numbers mean? Two numbers make up your blood pressure. The first number is called systolic pressure. The second is called diastolic pressure. An example of a blood pressure reading is "120 over 80" (or 120/80). If you are an adult and do not have a medical condition, use this guide to find out if your blood pressure is normal: Normal  First number: below 120.  Second number: below 80. Elevated  First number: 120-129.  Second number: below 80. Hypertension stage 1  First number: 130-139.  Second number: 80-89. Hypertension stage 2  First number: 140 or above.  Second number: 90 or above. Your blood pressure is above normal even if only the top or bottom number is above normal. Follow these instructions at  home:  Check your blood pressure as often as your doctor tells you to.  Take your monitor to your next doctor's appointment. Your doctor will: ? Make sure you are using it correctly. ? Make sure it is working right.  Make sure you understand what your blood pressure numbers should be.  Tell your doctor if your medicines are causing side effects. Contact a doctor if:  Your blood pressure keeps being high. Get help right away if:  Your first blood pressure number is higher than 180.  Your second blood pressure number is higher than 120. This information is not intended to replace advice given to you by your health care provider. Make sure you discuss any questions you have with your health care provider. Document Released: 08/09/2008 Document Revised: 07/25/2016 Document Reviewed: 02/03/2016 Elsevier Interactive Patient Education  2018 [employer].  Hypertension Hypertension is another name for high blood pressure. High blood pressure forces your heart to work harder to pump blood. This can cause problems over time. There are two numbers in a blood pressure reading. There is a top number (systolic) over a bottom number (diastolic). It is best to have a blood pressure below 120/80. Healthy choices can help lower your blood pressure. You may need medicine to help lower your blood pressure if:  Your blood pressure cannot be lowered with healthy choices.  Your blood pressure is higher than 130/80.  Follow these instructions at home: Eating and drinking  If directed, follow the DASH eating plan. This diet includes: ? Filling half of your plate at each meal with fruits and vegetables. ? Filling one quarter of your plate at each meal with whole grains. Whole grains include whole wheat pasta, brown rice, and whole grain bread. ? Eating or drinking low-fat dairy products, such as skim milk or low-fat yogurt. ? Filling one quarter of your plate at each meal with low-fat (lean) proteins.  Low-fat proteins include fish, skinless chicken, eggs, beans, and tofu. ? Avoiding fatty meat, cured and processed meat, or chicken with skin. ? Avoiding premade or processed food.  Eat less than 1,500 mg of salt (sodium) a day.  Limit alcohol use to no more than 1 drink a day for nonpregnant women and 2 drinks a day for men. One drink equals 12 oz of beer, 5 oz of wine, or 1 oz of hard liquor. Lifestyle  Work with your doctor to stay at a healthy weight or to lose weight. Ask your doctor what the best weight is for you.  Get at least 30 minutes  of exercise that causes your heart to beat faster (aerobic exercise) most days of the week. This may include walking, swimming, or biking.  Get at least 30 minutes of exercise that strengthens your muscles (resistance exercise) at least 3 days a week. This may include lifting weights or pilates.  Do not use any products that contain nicotine or tobacco. This includes cigarettes and e-cigarettes. If you need help quitting, ask your doctor.  Check your blood pressure at home as told by your doctor.  Keep all follow-up visits as told by your doctor. This is important. Medicines  Take over-the-counter and prescription medicines only as told by your doctor. Follow directions carefully.  Do not skip doses of blood pressure medicine. The medicine does not work as well if you skip doses. Skipping doses also puts you at risk for problems.  Ask your doctor about side effects or reactions to medicines that you should watch for. Contact a doctor if:  You think you are having a reaction to the medicine you are taking.  You have headaches that keep coming back (recurring).  You feel dizzy.  You have swelling in your ankles.  You have trouble with your vision. Get help right away if:  You get a very bad headache.  You start to feel confused.  You feel weak or numb.  You feel faint.  You get very bad pain in your: ? Chest. ? Belly  (abdomen).  You throw up (vomit) more than once.  You have trouble breathing. Summary  Hypertension is another name for high blood pressure.  Making healthy choices can help lower blood pressure. If your blood pressure cannot be controlled with healthy choices, you may need to take medicine. This information is not intended to replace advice given to you by your health care provider. Make sure you discuss any questions you have with your health care provider. Document Released: 02/13/2008 Document Revised: 07/25/2016 Document Reviewed: 07/25/2016 Elsevier Interactive Patient Education  2018 [employer].  Managing Your Hypertension Hypertension is commonly called high blood pressure. This is when the force of your blood pressing against the walls of your arteries is too strong. Arteries are blood vessels that carry blood from your heart throughout your body. Hypertension forces the heart to work harder to pump blood, and may cause the arteries to become narrow or stiff. Having untreated or uncontrolled hypertension can cause heart attack, stroke, kidney disease, and other problems. What are blood pressure readings? A blood pressure reading consists of a higher number over a lower number. Ideally, your blood pressure should be below 120/80. The first ("top") number is called the systolic pressure. It is a measure of the pressure in your arteries as your heart beats. The second ("bottom") number is called the diastolic pressure. It is a measure of the pressure in your arteries as the heart relaxes. What does my blood pressure reading mean? Blood pressure is classified into four stages. Based on your blood pressure reading, your health care provider may use the following stages to determine what type of treatment you need, if any. Systolic pressure and diastolic pressure are measured in a unit called mm Hg. Normal  Systolic pressure: below 120.  Diastolic pressure: below  80. Elevated  Systolic pressure: 120-129.  Diastolic pressure: below 80. Hypertension stage 1  Systolic pressure: 130-139.  Diastolic pressure: 80-89. Hypertension stage 2  Systolic pressure: 140 or above.  Diastolic pressure: 90 or above. What health risks are associated with hypertension? Managing your hypertension  is an important responsibility. Uncontrolled hypertension can lead to:  A heart attack.  A stroke.  A weakened blood vessel (aneurysm).  Heart failure.  Kidney damage.  Eye damage.  Metabolic syndrome.  Memory and concentration problems.  What changes can I make to manage my hypertension? Hypertension can be managed by making lifestyle changes and possibly by taking medicines. Your health care provider will help you make a plan to bring your blood pressure within a normal range. Eating and drinking  Eat a diet that is high in fiber and potassium, and low in salt (sodium), added sugar, and fat. An example eating plan is called the DASH (Dietary Approaches to Stop Hypertension) diet. To eat this way: ? Eat plenty of fresh fruits and vegetables. Try to fill half of your plate at each meal with fruits and vegetables. ? Eat whole grains, such as whole wheat pasta, brown rice, or whole grain bread. Fill about one quarter of your plate with whole grains. ? Eat low-fat diary products. ? Avoid fatty cuts of meat, processed or cured meats, and poultry with skin. Fill about one quarter of your plate with lean proteins such as fish, chicken without skin, beans, eggs, and tofu. ? Avoid premade and processed foods. These tend to be higher in sodium, added sugar, and fat.  Reduce your daily sodium intake. Most people with hypertension should eat less than 1,500 mg of sodium a day.  Limit alcohol intake to no more than 1 drink a day for nonpregnant women and 2 drinks a day for men. One drink equals 12 oz of beer, 5 oz of wine, or 1 oz of hard liquor. Lifestyle  Work  with your health care provider to maintain a healthy body weight, or to lose weight. Ask what an ideal weight is for you.  Get at least 30 minutes of exercise that causes your heart to beat faster (aerobic exercise) most days of the week. Activities may include walking, swimming, or biking.  Include exercise to strengthen your muscles (resistance exercise), such as weight lifting, as part of your weekly exercise routine. Try to do these types of exercises for 30 minutes at least 3 days a week.  Do not use any products that contain nicotine or tobacco, such as cigarettes and e-cigarettes. If you need help quitting, ask your health care provider.  Control any long-term (chronic) conditions you have, such as high cholesterol or diabetes. Monitoring  Monitor your blood pressure at home as told by your health care provider. Your personal target blood pressure may vary depending on your medical conditions, your age, and other factors.  Have your blood pressure checked regularly, as often as told by your health care provider. Working with your health care provider  Review all the medicines you take with your health care provider because there may be side effects or interactions.  Talk with your health care provider about your diet, exercise habits, and other lifestyle factors that may be contributing to hypertension.  Visit your health care provider regularly. Your health care provider can help you create and adjust your plan for managing hypertension. Will I need medicine to control my blood pressure? Your health care provider may prescribe medicine if lifestyle changes are not enough to get your blood pressure under control, and if:  Your systolic blood pressure is 130 or higher.  Your diastolic blood pressure is 80 or higher.  Take medicines only as told by your health care provider. Follow the directions carefully. Blood pressure  medicines must be taken as prescribed. The medicine does not  work as well when you skip doses. Skipping doses also puts you at risk for problems. Contact a health care provider if:  You think you are having a reaction to medicines you have taken.  You have repeated (recurrent) headaches.  You feel dizzy.  You have swelling in your ankles.  You have trouble with your vision. Get help right away if:  You develop a severe headache or confusion.  You have unusual weakness or numbness, or you feel faint.  You have severe pain in your chest or abdomen.  You vomit repeatedly.  You have trouble breathing. Summary  Hypertension is when the force of blood pumping through your arteries is too strong. If this condition is not controlled, it may put you at risk for serious complications.  Your personal target blood pressure may vary depending on your medical conditions, your age, and other factors. For most people, a normal blood pressure is less than 120/80.  Hypertension is managed by lifestyle changes, medicines, or both. Lifestyle changes include weight loss, eating a healthy, low-sodium diet, exercising more, and limiting alcohol. This information is not intended to replace advice given to you by your health care provider. Make sure you discuss any questions you have with your health care provider. Document Released: 05/21/2012 Document Revised: 07/25/2016 Document Reviewed: 07/25/2016 Elsevier Interactive Patient Education  [employer].

## 2017-06-04 NOTE — Progress Notes (Signed)
Subjective:    Patient ID: Kelly Patton, female    DOB: Mar 07, 1971, 46 y.o.   MRN: [id number]  No chief complaint on file.   HPI Patient was seen today for acute concern.  Hypertension: -Per pt BP "always high".  -HTN runs in family -never been on meds. -endorses HA, but states hasn't eaten all day.  Denies blurred vision.  Cough: -states always has a few x per year which requires abx. -Endorses cough with some phlegm/feeling sick 3 weeks. -Patient took 5 days of antibiotics from friend.  When sure what she took. 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 States she can't be in the kitchen coughing all day. -Patient requesting antibiotics.   Past Medical History:  Diagnosis Date  . Carpal tunnel syndrome, right   . Shortness of breath dyspnea    with exertion uses inhaler    Past Surgical History:  Procedure Laterality Date  . CARPAL TUNNEL RELEASE Right 01/31/2016   Procedure: CARPAL TUNNEL RELEASE;  Surgeon: Cammy Copa, MD;  Location: [facility] OR;  Service: Orthopedics;  Laterality: Right;  . HAND TENDON SURGERY     right    Family History  Problem Relation Age of Onset  . Hypertension Father   . Hypertension Other     Social History   Social History  . Marital status: Single    Spouse name: N/A  . Number of children: N/A  . Years of education: N/A   Occupational History  . Not on file.   Social History Main Topics  . Smoking status: Current Some Day Smoker    Types: Cigarettes  . Smokeless tobacco: Never Used     Comment: Smokes 1 cigarette/week for 2 years  . Alcohol use Yes     Comment: once a week; fifth of liquor  . Drug use: Yes    Types: Marijuana     Comment: occasionally  . Sexual activity: Yes    Birth control/ protection: None   Other Topics Concern  . Not on file   Social History Narrative  . No narrative on file    Outpatient Medications  Prior to Visit  Medication Sig Dispense Refill  . diclofenac (VOLTAREN) 75 MG EC tablet Take 1 tablet (75 mg total) by mouth 2 (two) times daily. 30 tablet 0  . oxyCODONE-acetaminophen (PERCOCET/ROXICET) 5-325 MG tablet Take 1 tablet by mouth every 12 (twelve) hours as needed for severe pain. 15 tablet 0   No facility-administered medications prior to visit.     No Known Allergies  ROS General: Denies fever, chills, night sweats, changes in weight, changes in appetite HEENT: Denies headaches, ear pain, changes in vision  +HA, some rhinorrhea, irritated throat. CV: Denies CP, palpitations, SOB, orthopnea Pulm: Denies SOB, wheezing  +cough with some phlegm GI: Denies abdominal pain, nausea, vomiting, diarrhea, constipation GU: Denies dysuria, hematuria, frequency, vaginal discharge Msk: Denies muscle cramps, joint pains Neuro: Denies weakness, numbness, tingling Skin: Denies rashes, bruising Psych: Denies depression, anxiety, hallucinations     Objective:    Blood pressure (!) 140/100, pulse 83, height  (1.6 m), weight 170 lb 1.6 oz (77.2 kg), last menstrual period 06/01/2017.   Gen. Pleasant, well-nourished, in no distress, normal affect  HEENT: [place]/AT, face symmetric, no scleral icterus, PERRL, nares patent with some clear drainage, pharynx with mild erythema, no exudate. Postnasal drainage present. Neck: No JVD, no thyromegaly Lungs: no  accessory muscle use, CTAB, no wheezes or rales Cardiovascular: RRRl, no m/r/g, no peripheral edema Abdomen: soft and non-tender, no hepatosplenomegaly, BS normal. Musculoskeletal: No deformities, no cyanosis or clubbing, normal tone Neuro:  A&Ox3, CN II-XII intact, normal gait Skin:  Warm, no lesions/ rash    Assessment/Plan:  Hypertension, unspecified type  -BP 140/100 manual, remain elevated on recheck x2 -BMP in June normal. -Plan: chlorthalidone (HYGROTON) 25 MG tablet, Blood Pressure Monitoring (BLOOD PRESSURE CUFF) MISC -RTC  in 2 weeks for f/u  Cough  -Likely secondary to postnasal drainage from allergies -Discussed antibiotic overuse/misuse. -Offered nasal spray however pt declined as she does not like anything in her nose. - Plan: cetirizine (ZYRTEC) 10 MG tablet, benzonatate (TESSALON) 200 MG capsule  Environmental and seasonal allergies -Likely causing cough -Discussed taking daily allergy medication. Patient has previous prescription for Allegra at the pharmacy. Rx for Zyrtec written. Will pick up whichever is cheaper.  - Plan: cetirizine (ZYRTEC) 10 MG tablet

## 2017-06-17 ENCOUNTER — Ambulatory Visit (INDEPENDENT_AMBULATORY_CARE_PROVIDER_SITE_OTHER): Payer: BLUE CROSS/BLUE SHIELD | Admitting: Family Medicine

## 2017-06-17 ENCOUNTER — Encounter: Payer: Self-pay | Admitting: Family Medicine

## 2017-06-17 VITALS — BP 110/80 | HR 96 | Temp 97.7°F | Wt 164.7 lb

## 2017-06-17 DIAGNOSIS — R1011 Right upper quadrant pain: Secondary | ICD-10-CM | POA: Diagnosis not present

## 2017-06-17 DIAGNOSIS — I10 Essential (primary) hypertension: Secondary | ICD-10-CM

## 2017-06-17 NOTE — Patient Instructions (Addendum)
The lab is open each week day from 7:30 am-5:30 pm.  Remember to stop by to have your lab work done.  We have ordered labs at this visit. It can take up to 1-2 weeks for results and processing. If results require follow up or explanation, we will call you with instructions. Clinically stable results will be released to your [facility] or sent to you via mail. If you have not heard from [place] or cannot find your results in [facility] in 2 weeks please contact our office at [phone].    Abdominal Pain, Adult Many things can cause belly (abdominal) pain. Most times, belly pain is not dangerous. Many cases of belly pain can be watched and treated at home. Sometimes belly pain is serious, though. Your doctor will try to find the cause of your belly pain. Follow these instructions at home:  Take over-the-counter and prescription medicines only as told by your doctor. Do not take medicines that help you poop (laxatives) unless told to by your doctor.  Drink enough fluid to keep your pee (urine) clear or pale yellow.  Watch your belly pain for any changes.  Keep all follow-up visits as told by your doctor. This is important. Contact a doctor if:  Your belly pain changes or gets worse.  You are not hungry, or you lose weight without trying.  You are having trouble pooping (constipated) or have watery poop (diarrhea) for more than 2-3 days.  You have pain when you pee or poop.  Your belly pain wakes you up at night.  Your pain gets worse with meals, after eating, or with certain foods.  You are throwing up and cannot keep anything down.  You have a fever. Get help right away if:  Your pain does not go away as soon as your doctor says it should.  You cannot stop throwing up.  Your pain is only in areas of your belly, such as the right side or the left lower part of the belly.  You have bloody or black poop, or poop that looks like tar.  You have very bad pain, cramping, or bloating in your  belly.  You have signs of not having enough fluid or water in your body (dehydration), such as: ? Dark pee, very little pee, or no pee. ? Cracked lips. ? Dry mouth. ? Sunken eyes. ? Sleepiness. ? Weakness. This information is not intended to replace advice given to you by your health care provider. Make sure you discuss any questions you have with your health care provider. Document Released: 02/13/2008 Document Revised: 03/16/2016 Document Reviewed: 02/08/2016 Elsevier Interactive Patient Education  2017 Elsevier Inc.  Managing Your Hypertension Hypertension is commonly called high blood pressure. This is when the force of your blood pressing against the walls of your arteries is too strong. Arteries are blood vessels that carry blood from your heart throughout your body. Hypertension forces the heart to work harder to pump blood, and may cause the arteries to become narrow or stiff. Having untreated or uncontrolled hypertension can cause heart attack, stroke, kidney disease, and other problems. What are blood pressure readings? A blood pressure reading consists of a higher number over a lower number. Ideally, your blood pressure should be below 120/80. The first ("top") number is called the systolic pressure. It is a measure of the pressure in your arteries as your heart beats. The second ("bottom") number is called the diastolic pressure. It is a measure of the pressure in your arteries as  the heart relaxes. What does my blood pressure reading mean? Blood pressure is classified into four stages. Based on your blood pressure reading, your health care provider may use the following stages to determine what type of treatment you need, if any. Systolic pressure and diastolic pressure are measured in a unit called mm Hg. Normal  Systolic pressure: below 120.  Diastolic pressure: below 80. Elevated  Systolic pressure: 120-129.  Diastolic pressure: below 80. Hypertension stage 1  Systolic  pressure: 130-139.  Diastolic pressure: 80-89. Hypertension stage 2  Systolic pressure: 140 or above.  Diastolic pressure: 90 or above. What health risks are associated with hypertension? Managing your hypertension is an important responsibility. Uncontrolled hypertension can lead to:  A heart attack.  A stroke.  A weakened blood vessel (aneurysm).  Heart failure.  Kidney damage.  Eye damage.  Metabolic syndrome.  Memory and concentration problems.  What changes can I make to manage my hypertension? Hypertension can be managed by making lifestyle changes and possibly by taking medicines. Your health care provider will help you make a plan to bring your blood pressure within a normal range. Eating and drinking  Eat a diet that is high in fiber and potassium, and low in salt (sodium), added sugar, and fat. An example eating plan is called the DASH (Dietary Approaches to Stop Hypertension) diet. To eat this way: ? Eat plenty of fresh fruits and vegetables. Try to fill half of your plate at each meal with fruits and vegetables. ? Eat whole grains, such as whole wheat pasta, brown rice, or whole grain bread. Fill about one quarter of your plate with whole grains. ? Eat low-fat diary products. ? Avoid fatty cuts of meat, processed or cured meats, and poultry with skin. Fill about one quarter of your plate with lean proteins such as fish, chicken without skin, beans, eggs, and tofu. ? Avoid premade and processed foods. These tend to be higher in sodium, added sugar, and fat.  Reduce your daily sodium intake. Most people with hypertension should eat less than 1,500 mg of sodium a day.  Limit alcohol intake to no more than 1 drink a day for nonpregnant women and 2 drinks a day for men. One drink equals 12 oz of beer, 5 oz of wine, or 1 oz of hard liquor. Lifestyle  Work with your health care provider to maintain a healthy body weight, or to lose weight. Ask what an ideal weight is  for you.  Get at least 30 minutes of exercise that causes your heart to beat faster (aerobic exercise) most days of the week. Activities may include walking, swimming, or biking.  Include exercise to strengthen your muscles (resistance exercise), such as weight lifting, as part of your weekly exercise routine. Try to do these types of exercises for 30 minutes at least 3 days a week.  Do not use any products that contain nicotine or tobacco, such as cigarettes and e-cigarettes. If you need help quitting, ask your health care provider.  Control any long-term (chronic) conditions you have, such as high cholesterol or diabetes. Monitoring  Monitor your blood pressure at home as told by your health care provider. Your personal target blood pressure may vary depending on your medical conditions, your age, and other factors.  Have your blood pressure checked regularly, as often as told by your health care provider. Working with your health care provider  Review all the medicines you take with your health care provider because there may be side  effects or interactions.  Talk with your health care provider about your diet, exercise habits, and other lifestyle factors that may be contributing to hypertension.  Visit your health care provider regularly. Your health care provider can help you create and adjust your plan for managing hypertension. Will I need medicine to control my blood pressure? Your health care provider may prescribe medicine if lifestyle changes are not enough to get your blood pressure under control, and if:  Your systolic blood pressure is 130 or higher.  Your diastolic blood pressure is 80 or higher.  Take medicines only as told by your health care provider. Follow the directions carefully. Blood pressure medicines must be taken as prescribed. The medicine does not work as well when you skip doses. Skipping doses also puts you at risk for problems. Contact a health care  provider if:  You think you are having a reaction to medicines you have taken.  You have repeated (recurrent) headaches.  You feel dizzy.  You have swelling in your ankles.  You have trouble with your vision. Get help right away if:  You develop a severe headache or confusion.  You have unusual weakness or numbness, or you feel faint.  You have severe pain in your chest or abdomen.  You vomit repeatedly.  You have trouble breathing. Summary  Hypertension is when the force of blood pumping through your arteries is too strong. If this condition is not controlled, it may put you at risk for serious complications.  Your personal target blood pressure may vary depending on your medical conditions, your age, and other factors. For most people, a normal blood pressure is less than 120/80.  Hypertension is managed by lifestyle changes, medicines, or both. Lifestyle changes include weight loss, eating a healthy, low-sodium diet, exercising more, and limiting alcohol. This information is not intended to replace advice given to you by your health care provider. Make sure you discuss any questions you have with your health care provider. Document Released: 05/21/2012 Document Revised: 07/25/2016 Document Reviewed: 07/25/2016 Elsevier Interactive Patient Education  [employer].

## 2017-06-18 ENCOUNTER — Ambulatory Visit: Payer: BLUE CROSS/BLUE SHIELD | Admitting: Family Medicine

## 2017-06-18 NOTE — Progress Notes (Signed)
Subjective:    Patient ID: Kelly Patton, female    DOB: March 23, 1971, 46 y.o.   MRN: [id number]  Chief Complaint  Patient presents with  . Follow-up    HPI Patient was seen today for f/u on HTN.  States has been doing well.  Endorses compliance with chlorthalidone 25 mg.  Pt was unable to afford bp cuff, but has been checking it frequently at [facility].  By memory bp typically runs 134/48?Marland Kitchen  Pt denies HA, CP, blurred vision, neck pain.  Acute concern, stomach pain: -not having pain today -states pain comes and goes - feels like knots/contractions, mostly in RUQ -denies N/V, no a/w food -pt endorses regular BMs daily. -pt still has her gallbladder. -has not tried anything for the pain.  Pt is still using marijuana daily.   No Known Allergies  ROS General: Denies fever, chills, night sweats, changes in weight, changes in appetite HEENT: Denies headaches, ear pain, changes in vision, rhinorrhea, sore throat CV: Denies CP, palpitations, SOB, orthopnea Pulm: Denies SOB, cough, wheezing GI: Denies nausea, vomiting, diarrhea, constipation  +abd pain GU: Denies dysuria, hematuria, frequency, vaginal discharge Msk: Denies muscle cramps, joint pains Neuro: Denies weakness, numbness, tingling Skin: Denies rashes, bruising Psych: Denies depression, anxiety, hallucinations     Objective:    Blood pressure 110/80, pulse 96, temperature 97.7 F (36.5 C), temperature source Oral, weight 164 lb 11.2 oz (74.7 kg), last menstrual period 06/01/2017.   Gen. Pleasant, well-nourished, in no distress, normal affect  HEENT: [place]/AT, face symmetric, no scleral icterus, PERRLA, nares patent without drainage, pharynx without erythema or exudate. Lungs: no accessory muscle uss, CTAB, no wheezes or rales Cardiovascular: RRR, no m/r/g, no peripheral edema Abdomen: soft and non-tender, no hepatosplenomegaly, BS normal. Musculoskeletal: No deformities, no cyanosis or clubbing, normal  tone Neuro:  A&Ox3, CN II-XII intact, normal gait Skin:  Warm, no lesions/ rash  Assessment/Plan:  Essential hypertension -continue Chlorthalidone 25 mg daily -encouraged to eat healthier -given handout  Right upper quadrant abdominal pain  - Plan: Comprehensive metabolic panel, Gamma GT -will order imaging prn  F/u in next few wks.

## 2017-06-19 ENCOUNTER — Telehealth (INDEPENDENT_AMBULATORY_CARE_PROVIDER_SITE_OTHER): Payer: Self-pay

## 2017-06-19 MED ORDER — OXYCODONE-ACETAMINOPHEN 5-325 MG PO TABS: 1.0000 | ORAL_TABLET | Freq: Two times a day (BID) | ORAL | 0 refills | Status: DC | PRN

## 2017-06-19 NOTE — Telephone Encounter (Signed)
IC advised could pick up tomorrow at front desk.

## 2017-06-19 NOTE — Telephone Encounter (Signed)
Patient would like a Rx refill on Oxycodone. CB# is [phone].  Please advise, thank You.

## 2017-06-19 NOTE — Telephone Encounter (Signed)
Please advise. Patient last seen 08/22 for trigger thumb. She is asking for refill on oxy. Last filled on 09/05 #15 1 po 12hrs

## 2017-06-19 NOTE — Telephone Encounter (Signed)
ok 

## 2017-06-19 NOTE — Addendum Note (Signed)
Addended by: Prescott Parma on: 06/19/2017 05:00 PM   Modules accepted: Orders

## 2017-07-04 ENCOUNTER — Encounter: Payer: Self-pay | Admitting: Family Medicine

## 2017-07-04 ENCOUNTER — Ambulatory Visit (INDEPENDENT_AMBULATORY_CARE_PROVIDER_SITE_OTHER): Payer: BLUE CROSS/BLUE SHIELD | Admitting: Family Medicine

## 2017-07-04 ENCOUNTER — Other Ambulatory Visit (HOSPITAL_COMMUNITY)
Admission: RE | Admit: 2017-07-04 | Discharge: 2017-07-04 | Disposition: A | Discharge status: Home / Self Care | Payer: BLUE CROSS/BLUE SHIELD | Source: Ambulatory Visit | Attending: Family Medicine | Admitting: Family Medicine

## 2017-07-04 VITALS — BP 120/90 | HR 87 | Temp 98.4°F | Wt 164.3 lb

## 2017-07-04 DIAGNOSIS — N898 Other specified noninflammatory disorders of vagina: Secondary | ICD-10-CM

## 2017-07-04 DIAGNOSIS — Z0001 Encounter for general adult medical examination with abnormal findings: Secondary | ICD-10-CM | POA: Diagnosis not present

## 2017-07-04 DIAGNOSIS — K219 Gastro-esophageal reflux disease without esophagitis: Secondary | ICD-10-CM | POA: Diagnosis not present

## 2017-07-04 LAB — COMPREHENSIVE METABOLIC PANEL
ALT: 14 U/L (ref 0–35)
AST: 14 U/L (ref 0–37)
Albumin: 4.3 g/dL (ref 3.5–5.2)
Alkaline Phosphatase: 54 U/L (ref 39–117)
BILIRUBIN TOTAL: 0.5 mg/dL (ref 0.2–1.2)
BUN: 16 mg/dL (ref 6–23)
CALCIUM: 9.8 mg/dL (ref 8.4–10.5)
CO2: 30 meq/L (ref 19–32)
CREATININE: 0.7 mg/dL (ref 0.40–1.20)
Chloride: 97 mEq/L (ref 96–112)
GFR: 115.33 mL/min (ref >60.00)
Glucose, Bld: 90 mg/dL (ref 70–99)
Potassium: 4.6 mEq/L (ref 3.5–5.1)
Sodium: 134 mEq/L — ABNORMAL LOW (ref 135–145)
TOTAL PROTEIN: 7.1 g/dL (ref 6.0–8.3)

## 2017-07-04 LAB — CBC WITH DIFFERENTIAL/PLATELET
BASOS ABS: 0 10*3/uL (ref 0.0–0.1)
BASOS PCT: 0.7 % (ref 0.0–3.0)
EOS ABS: 0.1 10*3/uL (ref 0.0–0.7)
Eosinophils Relative: 1.5 % (ref 0.0–5.0)
HEMATOCRIT: 39 % (ref 36.0–46.0)
Hemoglobin: 13 g/dL (ref 12.0–15.0)
LYMPHS ABS: 2.1 10*3/uL (ref 0.7–4.0)
LYMPHS PCT: 39.1 % (ref 12.0–46.0)
MCHC: 33.3 g/dL (ref 30.0–36.0)
MCV: 91.2 fl (ref 78.0–100.0)
MONOS PCT: 10 % (ref 3.0–12.0)
Monocytes Absolute: 0.5 10*3/uL (ref 0.1–1.0)
NEUTROS ABS: 2.6 10*3/uL (ref 1.4–7.7)
NEUTROS PCT: 48.7 % (ref 43.0–77.0)
PLATELETS: 348 10*3/uL (ref 150.0–400.0)
RBC: 4.27 Mil/uL (ref 3.87–5.11)
RDW: 13.2 % (ref 11.5–15.5)
WBC: 5.4 10*3/uL (ref 4.0–10.5)

## 2017-07-04 LAB — HEMOGLOBIN A1C: HEMOGLOBIN A1C: 5.6 % (ref 4.6–6.5)

## 2017-07-04 LAB — LDL CHOLESTEROL, DIRECT: Direct LDL: 68 mg/dL

## 2017-07-04 LAB — LIPID PANEL
Cholesterol: 163 mg/dL (ref 0–200)
HDL: 64.3 mg/dL (ref >39.00)
NONHDL: 99.15
TRIGLYCERIDES: 257 mg/dL — AB (ref 0.0–149.0)
Total CHOL/HDL Ratio: 3
VLDL: 51.4 mg/dL — ABNORMAL HIGH (ref 0.0–40.0)

## 2017-07-04 MED ORDER — OMEPRAZOLE 20 MG PO CPDR: 20.0000 mg | DELAYED_RELEASE_CAPSULE | Freq: Every day | ORAL | 1 refills | Status: DC

## 2017-07-04 MED ORDER — FLUCONAZOLE 150 MG PO TABS: 150.0000 mg | ORAL_TABLET | Freq: Once | ORAL | 0 refills | Status: AC

## 2017-07-04 NOTE — Addendum Note (Signed)
Addended by: Abbe AmsterdamBANKS, Tehila Sokolow R on: 07/04/2017 10:15 AM   Modules accepted: Orders

## 2017-07-04 NOTE — Patient Instructions (Addendum)

## 2017-07-04 NOTE — Progress Notes (Addendum)
Patient ID: Kelly Patton, female   DOB: 01/17/1971, 46 y.o.   MRN: [id number] Subjective:    Patient ID: Kelly Dineicole Michelle Shrieves, female    DOB: 01/17/1971, 46 y.o.   MRN: [id number]  Chief Complaint  Patient presents with  . Annual Exam    HPI Patient was seen today for CPE.  Pt states she has been doing well.  Pt states she recently got engaged to her girlfriend, they were together for 8 yrs, but they broke up yesterday.  Pt states she is still having an occasional pain in her RUQ/mid epigastric area.  She has been taking antiacid which seems to help.  Pt was suppose to have a CMP drawn but never had it done.  Also taking OTC zantac, but would like something else for heartburn.  Pt states she quit smoking marijuana.  Past Medical History:  Diagnosis Date  . Carpal tunnel syndrome, right   . Shortness of breath dyspnea    with exertion uses inhaler    No Known Allergies  ROS General: Denies fever, chills, night sweats, changes in weight, changes in appetite HEENT: Denies headaches, ear pain, changes in vision, rhinorrhea, sore throat CV: Denies CP, palpitations, SOB, orthopnea Pulm: Denies SOB, cough, wheezing GI: Denies abdominal pain, nausea, vomiting, diarrhea, constipation  +RUQ/epigastric pain GU: Denies dysuria, hematuria, frequency, vaginal discharge Msk: Denies muscle cramps, joint pains Neuro: Denies weakness, numbness, tingling Skin: Denies rashes, bruising Psych: Denies depression, anxiety, hallucinations     Objective:    Blood pressure 120/90, pulse 87, temperature 98.4 F (36.9 C), temperature source Oral, weight 164 lb 4.8 oz (74.5 kg).   Gen. Pleasant, well-nourished, in no distress, normal affect   HEENT: [place]/AT, face symmetric, conjunctiva clear, no scleral icterus, PERRLA, EOMI, nares patent without drainage, pharynx without erythema or exudate. Neck: No JVD, no thyromegaly, no carotid bruits Lungs: no accessory muscle use, CTAB, no wheezes  or rales Cardiovascular: RRR, no m/r/g, no peripheral edema Abdomen: BS present, soft, NT/ND, no hepatosplenomegaly. Musculoskeletal: No deformities, no cyanosis or clubbing, normal tone Neuro:  A&Ox3, CN II-XII intact, normal gait Skin:  Warm, no lesions/ rash GU:  Breast large with dense fibrous tissue, several seemingly cystic nodules noted b/l, no nipple retraction or d/c.  Pelvic: nml external female genitalia, normal vaginal rugae, cervix pointing posterior with thick white d/c surrounding.  No CMT, no masses noted on bimanual exam.  Wt Readings from Last 3 Encounters:  07/04/17 164 lb 4.8 oz (74.5 kg)  06/17/17 164 lb 11.2 oz (74.7 kg)  06/04/17 170 lb 1.6 oz (77.2 kg)    Lab Results  Component Value Date   WBC 8.7 03/03/2017   HGB 12.5 03/03/2017   HCT 38.0 03/03/2017   PLT 408 (H) 03/03/2017   GLUCOSE 98 03/03/2017   ALT 16 03/03/2017   AST 17 03/03/2017   NA 136 03/03/2017   K 3.8 03/03/2017   CL 102 03/03/2017   CREATININE 0.68 03/03/2017   BUN 10 03/03/2017   CO2 27 03/03/2017    Assessment/Plan:  CPE: -anticipatory guidance given- safe sex, wear seatbelts, smoke detectors, quit smoking, etc -Handouts given -Will send to lab for Lipids, CMP, CBC -will treat for yeast given d/c noted on pelvic exam -rx sent for omeprazole

## 2017-07-05 LAB — CYTOLOGY - PAP
ADEQUACY: ABSENT
DIAGNOSIS: NEGATIVE
HPV: NOT DETECTED

## 2017-07-12 ENCOUNTER — Telehealth (INDEPENDENT_AMBULATORY_CARE_PROVIDER_SITE_OTHER): Payer: Self-pay | Admitting: Orthopedic Surgery

## 2017-07-12 NOTE — Telephone Encounter (Signed)
Patient called needing Rx refilled (Oxycodone, Gabapentin and Ibuprofen that will not hurt her stomach.) The number to contact patient is [phone]

## 2017-07-12 NOTE — Telephone Encounter (Signed)
Ok to refill?  IC s/w patient advised Dr August Saucerean out of office until Monday.

## 2017-07-15 MED ORDER — OXYCODONE-ACETAMINOPHEN 5-325 MG PO TABS: 1.0000 | ORAL_TABLET | Freq: Two times a day (BID) | ORAL | 0 refills | Status: DC | PRN

## 2017-07-15 MED ORDER — IBUPROFEN 600 MG PO TABS: ORAL_TABLET | ORAL | 0 refills | Status: DC

## 2017-07-15 MED ORDER — GABAPENTIN 100 MG PO CAPS: 100.0000 mg | ORAL_CAPSULE | Freq: Two times a day (BID) | ORAL | 0 refills | Status: DC

## 2017-07-15 NOTE — Addendum Note (Signed)
Addended byPrescott Parma: Aleysha Meckler on: 07/15/2017 02:47 PM   Modules accepted: Orders

## 2017-07-15 NOTE — Telephone Encounter (Signed)
Okay for 30 tablets 1 by mouth every 12 hours when necessary pain no refill

## 2017-07-15 NOTE — Telephone Encounter (Signed)
Tried calling patient to advise could pick up rx at front desk for oxycodone. Other 2 prescriptions sent to pharmacy on file for patient

## 2017-07-15 NOTE — Telephone Encounter (Signed)
Ok for gabapentin and ibuprofen as well? Please advise quantity and sig

## 2017-07-15 NOTE — Telephone Encounter (Signed)
Gaba 100 po bid # 60 - ibu 600 po qd to bid  3 60 PLS CLAL HTX

## 2017-07-17 ENCOUNTER — Encounter: Payer: Self-pay | Admitting: Family Medicine

## 2017-07-17 ENCOUNTER — Ambulatory Visit (INDEPENDENT_AMBULATORY_CARE_PROVIDER_SITE_OTHER): Payer: BLUE CROSS/BLUE SHIELD | Admitting: Family Medicine

## 2017-07-17 VITALS — BP 104/80 | HR 90 | Temp 98.5°F | Wt 167.7 lb

## 2017-07-17 DIAGNOSIS — I10 Essential (primary) hypertension: Secondary | ICD-10-CM | POA: Insufficient documentation

## 2017-07-17 DIAGNOSIS — F5101 Primary insomnia: Secondary | ICD-10-CM | POA: Diagnosis not present

## 2017-07-17 NOTE — Progress Notes (Signed)
Subjective:    Patient ID: Kelly Patton, female    DOB: 08/19/1971, 46 y.o.   MRN: [id number]  Chief Complaint  Patient presents with  . Insomnia    HPI Patient was seen today for insomnia.  Pt states she has been "taking tylenol PM forever".  She may sleep 2-3 hrs per night.  Pt may get home from work ~10:30 pm.  She may lay in bed for a few hours, then typically wakes up ~7-7:15 am and cannot go back to sleep.  Pt endorses drinking alcohol, playing games on her phone, and watching tv prior to bed.  Pt does not drink caffeine.    HTN: -pt endorses compliance with chlorthalidone 25 mg -she occasionally checks her bp when out at a store -pt denies dizziness, HA, CP, changes in vision.  Past Medical History:  Diagnosis Date  . Carpal tunnel syndrome, right   . Shortness of breath dyspnea    with exertion uses inhaler    No Known Allergies  ROS General: Denies fever, chills, night sweats, changes in weight, changes in appetite +insomnia HEENT: Denies headaches, ear pain, changes in vision, rhinorrhea, sore throat CV: Denies CP, palpitations, SOB, orthopnea Pulm: Denies SOB, cough, wheezing GI: Denies abdominal pain, nausea, vomiting, diarrhea, constipation GU: Denies dysuria, hematuria, frequency, vaginal discharge Msk: Denies muscle cramps, joint pains Neuro: Denies weakness, numbness, tingling Skin: Denies rashes, bruising Psych: Denies depression, anxiety, hallucinations     Objective:    Blood pressure 104/80, pulse 90, temperature 98.5 F (36.9 C), temperature source Oral, weight 167 lb 11.2 oz (76.1 kg).   Gen. Pleasant, well-nourished, in no distress, normal affect  HEENT: [place]/AT, face symmetric, no scleral icterus, PERRLA, nares patent without drainage Lungs: no accessory muscle use, CTAB, no wheezes or rales Cardiovascular: RRR, no m/r/g, no peripheral edema Neuro:  A&Ox3, CN II-XII intact, normal gait   Wt Readings from Last 3 Encounters:   07/17/17 167 lb 11.2 oz (76.1 kg)  07/04/17 164 lb 4.8 oz (74.5 kg)  06/17/17 164 lb 11.2 oz (74.7 kg)    Lab Results  Component Value Date   WBC 5.4 07/04/2017   HGB 13.0 07/04/2017   HCT 39.0 07/04/2017   PLT 348.0 07/04/2017   GLUCOSE 90 07/04/2017   CHOL 163 07/04/2017   TRIG 257.0 (H) 07/04/2017   HDL 64.30 07/04/2017   LDLDIRECT 68.0 07/04/2017   ALT 14 07/04/2017   AST 14 07/04/2017   NA 134 (L) 07/04/2017   K 4.6 07/04/2017   CL 97 07/04/2017   CREATININE 0.70 07/04/2017   BUN 16 07/04/2017   CO2 30 07/04/2017   HGBA1C 5.6 07/04/2017    Assessment/Plan:  Primary insomnia -Discussed sleep hygiene.   -Given handouts -Discussed d/c'ing EtOH use, playing on the phone, watching tv, etc prior to bed. -Pt to try sleep hygiene modifications for 1-2 wks.  If continues to have trouble with sleep will proceed with a trial of medication.  Essential hypertension -controlled  104/80 -Pt congratulated -Encouraged to continue lifestyle modifications -Continue chlorthalidone 25 mg  F/u in 1 month

## 2017-07-17 NOTE — Patient Instructions (Addendum)

## 2017-07-26 ENCOUNTER — Telehealth: Payer: Self-pay | Admitting: Family Medicine

## 2017-07-26 NOTE — Telephone Encounter (Signed)
Please advise. Thank you

## 2017-07-26 NOTE — Telephone Encounter (Signed)
Copied from CRM [phone]. Topic: Quick Communication - See Telephone Encounter >> Jul 26, 2017 12:37 PM Eston Mouldavis, Griffith Santilli B wrote: CRM for notification. See Telephone encounter for:  PT states Dr Salomon FickBanks gave her a paper of things to try to help her sleep and those things are not working.  Dr Salomon FickBanks told her to call back if not working and she would prescribe something.  [facility]  07/26/17.

## 2017-08-05 ENCOUNTER — Other Ambulatory Visit: Payer: Self-pay | Admitting: Family Medicine

## 2017-08-05 ENCOUNTER — Other Ambulatory Visit: Payer: Self-pay | Admitting: Emergency Medicine

## 2017-08-05 ENCOUNTER — Ambulatory Visit (INDEPENDENT_AMBULATORY_CARE_PROVIDER_SITE_OTHER): Payer: BLUE CROSS/BLUE SHIELD | Admitting: Orthopedic Surgery

## 2017-08-05 MED ORDER — ZOLPIDEM TARTRATE 5 MG PO TABS: 5.0000 mg | ORAL_TABLET | Freq: Every evening | ORAL | 0 refills | Status: DC | PRN

## 2017-08-05 NOTE — Telephone Encounter (Signed)
rx printed out, couldn't send

## 2017-08-05 NOTE — Telephone Encounter (Signed)
Spoke with patient regarding Zolpidem 5 mg being sent to patient pharmacy. Patient was made aware to not drink while taking this medication and this is a temporary prescription. Patient understood and had no further questions. Patient prescription has been sent into the pharmacy.

## 2017-08-05 NOTE — Telephone Encounter (Signed)
Rx for Ambien sent to [facility] [address].  Please advise pt she cannot drink alcohol or smoke marijuana while taking this medicine.  Also advise that this medicine is intended for short term use only and is to be used as needed.  Pt needs to continue sleep hygiene.  F/u in 1 month.

## 2017-08-05 NOTE — Telephone Encounter (Signed)
Rx has been called in. Nothing further needed.  

## 2017-08-09 ENCOUNTER — Telehealth (INDEPENDENT_AMBULATORY_CARE_PROVIDER_SITE_OTHER): Payer: Self-pay | Admitting: Orthopedic Surgery

## 2017-08-09 NOTE — Telephone Encounter (Signed)
[facility] does not have a pharmacy? Patient needs [facility] [address]. She also wants to know if Dr. August Saucerean needs to see her back for another test since she doesn't want her surgery scheduled until the beginning of the year. She also inquired about receiving another card to get her medicine in the mail that makes it less expensive. She wasn't sure of the company but Dr. August Saucerean has given her a card before. CB# [phone]

## 2017-08-12 NOTE — Telephone Encounter (Signed)
Please advise. Do you want to see her back prior to surgery. She had an appt last week but did not keep appt.

## 2017-08-13 NOTE — Telephone Encounter (Signed)
Ok to rf gabapentin at prior strength and dose no need for rov ok to schedule trig thumb release when she is ready

## 2017-08-14 MED ORDER — GABAPENTIN 100 MG PO CAPS: 100.0000 mg | ORAL_CAPSULE | Freq: Two times a day (BID) | ORAL | 0 refills | Status: DC

## 2017-08-14 MED ORDER — IBUPROFEN-FAMOTIDINE 800-26.6 MG PO TABS: ORAL_TABLET | ORAL | 0 refills | Status: DC

## 2017-08-14 NOTE — Telephone Encounter (Signed)
Blue sheet done

## 2017-08-14 NOTE — Telephone Encounter (Signed)
Can you please fill out blue sheet for patient to be scheduled for surgery? Thanks.

## 2017-08-26 ENCOUNTER — Telehealth (INDEPENDENT_AMBULATORY_CARE_PROVIDER_SITE_OTHER): Payer: Self-pay | Admitting: Orthopedic Surgery

## 2017-08-26 MED ORDER — GABAPENTIN 100 MG PO CAPS: 100.0000 mg | ORAL_CAPSULE | Freq: Two times a day (BID) | ORAL | 0 refills | Status: DC

## 2017-08-26 NOTE — Telephone Encounter (Signed)
IC and verified with pharmacy. Rx was received but never picked up. They put rx back because had been over 2 weeks since submitted. I resubmitted to pharmacy

## 2017-08-26 NOTE — Telephone Encounter (Signed)
Patients pharmacy put back her Gabapentin because she was out of town and took too long. Can you call [facility] back [facility] the [facility]? Patients # [phone]

## 2017-09-18 ENCOUNTER — Encounter: Payer: Self-pay | Admitting: Family Medicine

## 2017-09-18 ENCOUNTER — Ambulatory Visit (INDEPENDENT_AMBULATORY_CARE_PROVIDER_SITE_OTHER): Payer: BLUE CROSS/BLUE SHIELD | Admitting: Family Medicine

## 2017-09-18 VITALS — BP 100/70 | HR 96 | Temp 99.0°F | Wt 169.8 lb

## 2017-09-18 DIAGNOSIS — R202 Paresthesia of skin: Secondary | ICD-10-CM

## 2017-09-18 DIAGNOSIS — F3010 Manic episode without psychotic symptoms, unspecified: Secondary | ICD-10-CM | POA: Diagnosis not present

## 2017-09-18 NOTE — Progress Notes (Signed)
Subjective:    Patient ID: Kelly Patton, female    DOB: 12-19-1970, 47 y.o.   MRN: [id number]  No chief complaint on file.   HPI Patient was seen today for acute concern.  Pt endorses feeling of bugs crawling in her hair and a feeling of coldness when she puts her hand above her head. Pt states she is a "clean person" and does not have bugs at home. Pt states she pulled out the piece of her hair and noticed a bug on the end of it.  Patient states in the past when she was using crack cocaine she had a similar feeling.  Patient denies current crack cocaine use.  Pt states she feels like she is "going crazy, but I'm not crazy".  Pt also expresses concern about other people knowing why she is being seen today.  Patient does endorse daily marijuana use.  Patient expresses that after she leaves [facility] today she will head to [facility] to see a psychiatrist.  Patient states she wants something to be given to her for the bugs crawling in her hair "a shampoo, a pill, something".  Pt also endorses auditory hallucinations at times.  She denies SI/HI.  Pt denies recent travel out of the country, cuts or skin lesions.  Past Medical History:  Diagnosis Date  . Carpal tunnel syndrome, right   . Shortness of breath dyspnea    with exertion uses inhaler    No Known Allergies  ROS General: Denies fever, chills, night sweats, changes in weight, changes in appetite HEENT: Denies headaches, ear pain, changes in vision, rhinorrhea, sore throat CV: Denies CP, palpitations, SOB, orthopnea Pulm: Denies SOB, cough, wheezing GI: Denies abdominal pain, nausea, vomiting, diarrhea, constipation GU: Denies dysuria, hematuria, frequency, vaginal discharge Msk: Denies muscle cramps, joint pains Neuro: Denies weakness, numbness, tingling   + paresthesias Skin: Denies rashes, bruising Psych: Denies depression, anxiety,     +hallucinations     Objective:    Blood pressure 100/70, pulse 96, temperature 99 F  (37.2 C), temperature source Oral, weight 169 lb 12.8 oz (77 kg).   Gen. Pleasant, well-nourished, in no distress HEENT: [place]/AT, face symmetric, conjunctiva clear, no scleral icterus, PERRLA, nares patent without drainage Lungs: no accessory muscle use, CTAB, no wheezes or rales Cardiovascular: RRR, no m/r/g, no peripheral edema Neuro:  A&Ox3, CN II-XII intact, normal gait Skin:  Warm, dry, intact.  No lesions/ rash.  No insects seen on inspection of skin and hair. Psych:  Elevated mood, pressured speech, perseverates on bugs being in her body, paranoid.   Wt Readings from Last 3 Encounters:  09/18/17 169 lb 12.8 oz (77 kg)  07/17/17 167 lb 11.2 oz (76.1 kg)  07/04/17 164 lb 4.8 oz (74.5 kg)    Lab Results  Component Value Date   WBC 5.4 07/04/2017   HGB 13.0 07/04/2017   HCT 39.0 07/04/2017   PLT 348.0 07/04/2017   GLUCOSE 90 07/04/2017   CHOL 163 07/04/2017   TRIG 257.0 (H) 07/04/2017   HDL 64.30 07/04/2017   LDLDIRECT 68.0 07/04/2017   ALT 14 07/04/2017   AST 14 07/04/2017   NA 134 (L) 07/04/2017   K 4.6 07/04/2017   CL 97 07/04/2017   CREATININE 0.70 07/04/2017   BUN 16 07/04/2017   CO2 30 07/04/2017   HGBA1C 5.6 07/04/2017    Assessment/Plan:  Tingling of skin -Concern pt may have relapsed (prior h/o cocaine use) vs dry skin vs infection. -Discussed parasites less likely as  no h/o foreign travel or exposure. -pt reassured as exam normal -Pt encouraged to see Psychiatrist for this concern.  Manic behavior (HCC) -Concern pt has relapsed.vs having an episode of an underlying psych dx. -Though paranoid, at this time pt is not a danger to herself. -Pt advised [facility] by a Psychiatrist today. -At this time pt denies HI/SI, but advised if starts to have these feelings should call 911 or proceed to the nearest ED. -close f/u encouraged, 1 wk, sooner if needed.    Pt declined AVS.   Abbe AmsterdamShannon Nateisha Moyd, MD

## 2017-09-20 ENCOUNTER — Encounter: Payer: Self-pay | Admitting: Family Medicine

## 2017-09-25 ENCOUNTER — Telehealth: Payer: Self-pay | Admitting: Family Medicine

## 2017-09-25 NOTE — Telephone Encounter (Signed)
See attached request. Thanks. 

## 2017-09-25 NOTE — Telephone Encounter (Signed)
Copied from CRM #[id number]. Topic: Inquiry °>> Sep 25, 2017  2:14 PM Robinson, Andra M wrote: °Reason for CRM: Patient called stating that she needs a refill of Chlorthalidone (HYGROTON) 25 MG tablet. Patient has taken her last pill today. [facility] [place],  - [address] [phone] (Phone)   °[phone] (Fax). Patient also inquired on when she could come in for her "Slides" from Dr. Banks (?). Someone from the office please call patient ASAP.      Thank You!!! ° ° ° ° °

## 2017-09-25 NOTE — Telephone Encounter (Signed)
This is second telephone request.

## 2017-09-25 NOTE — Telephone Encounter (Signed)
Copied from CRM [id number]. Topic: Inquiry >> Sep 25, 2017  2:14 PM Yvonna Alanisobinson, Andra M wrote: Reason for CRM: Patient called stating that she needs a refill of Chlorthalidone (HYGROTON) 25 MG tablet. Patient has taken her last pill today. [facility] [place] - [address] [phone] (Phone)   [phone] (Fax). Patient also inquired on when she could come in for her "Slides" from Dr. Salomon FickBanks (?). Someone from the office please call patient ASAP.      Thank You!!!

## 2017-09-26 ENCOUNTER — Other Ambulatory Visit: Payer: Self-pay | Admitting: Emergency Medicine

## 2017-09-26 DIAGNOSIS — I10 Essential (primary) hypertension: Secondary | ICD-10-CM

## 2017-09-26 MED ORDER — CHLORTHALIDONE 25 MG PO TABS: 25.0000 mg | ORAL_TABLET | Freq: Every day | ORAL | 1 refills | Status: DC

## 2017-09-26 NOTE — Telephone Encounter (Signed)
Spoke with patient regarding blood medication refill needed. Refill for [place] has been sent to patient new pharmacy. . Asked patient whether or not she has seen a physichartrist patient stated yes that she has to f/u in 1 week. Also told patient that we did not get the slides in yet per Dr. Salomon FickBanks. Patient would also like a refill for Ambien. Please advise if ok to refill.

## 2017-09-27 ENCOUNTER — Other Ambulatory Visit: Payer: Self-pay | Admitting: Emergency Medicine

## 2017-09-27 MED ORDER — ZOLPIDEM TARTRATE 5 MG PO TABS: 5.0000 mg | ORAL_TABLET | Freq: Every evening | ORAL | 0 refills | Status: DC | PRN

## 2017-09-27 NOTE — Telephone Encounter (Signed)
Refill has been called into patient pharmacy. Nothing further needed.

## 2017-09-27 NOTE — Telephone Encounter (Signed)
Ok to send Ambien 30 pills, no refills to pharmacy.

## 2017-10-14 ENCOUNTER — Telehealth (INDEPENDENT_AMBULATORY_CARE_PROVIDER_SITE_OTHER): Payer: Self-pay

## 2017-10-14 ENCOUNTER — Inpatient Hospital Stay (INDEPENDENT_AMBULATORY_CARE_PROVIDER_SITE_OTHER): Payer: BLUE CROSS/BLUE SHIELD | Admitting: Orthopedic Surgery

## 2017-10-14 ENCOUNTER — Telehealth: Payer: Self-pay | Admitting: Family Medicine

## 2017-10-14 NOTE — Telephone Encounter (Signed)
Scheduled trigger thumb release on 11/12/17.  Requesting something for pain in the interim.  [phone]

## 2017-10-14 NOTE — Telephone Encounter (Signed)
Copied from CRM [id number]. Topic: Quick Communication - See Telephone Encounter >> Oct 14, 2017  4:13 PM Arlyss Gandyichardson, Eddie Payette N, NT wrote: CRM for notification. See Telephone encounter for: Pt calling to speak with Dr. Salomon FickBanks, not her nurse about something they spoke about in confidence and she does not want to discuss this with anyone other than Dr. Salomon FickBanks. Please advise  10/14/17.

## 2017-10-14 NOTE — Telephone Encounter (Signed)
Please advise. Thanks.  

## 2017-10-14 NOTE — Telephone Encounter (Signed)
Ok for t 3 1 po q 12 # 30 pls clal thx

## 2017-10-15 MED ORDER — ACETAMINOPHEN-CODEINE #3 300-30 MG PO TABS: 1.0000 | ORAL_TABLET | Freq: Two times a day (BID) | ORAL | 0 refills | Status: DC | PRN

## 2017-10-15 NOTE — Addendum Note (Signed)
Addended byPrescott Parma: Arnet Hofferber on: 10/15/2017 08:12 AM   Modules accepted: Orders

## 2017-10-15 NOTE — Telephone Encounter (Signed)
Tried calling patient. No answer. LM advising called rx to pharmacy listed in chart.

## 2017-10-15 NOTE — Telephone Encounter (Signed)
Please advise if you would like for me to contact patient or would you like to speak with her personally. Thank you.

## 2017-10-16 ENCOUNTER — Telehealth (INDEPENDENT_AMBULATORY_CARE_PROVIDER_SITE_OTHER): Payer: Self-pay | Admitting: Orthopedic Surgery

## 2017-10-16 NOTE — Telephone Encounter (Signed)
Patient left a message wanting to talk to you about changing her surgery date.  CB#[phone].  Thank you.

## 2017-10-21 NOTE — Telephone Encounter (Signed)
See message.

## 2017-10-22 NOTE — Telephone Encounter (Signed)
Spoke with patient regarding how she is doing she states that she is doing good and just wanted to see if the slides have came in yet?

## 2017-10-22 NOTE — Telephone Encounter (Signed)
No slides.

## 2017-10-22 NOTE — Telephone Encounter (Signed)
Will you call out friend and see how she is doing?

## 2017-10-24 NOTE — Telephone Encounter (Signed)
I spoke with Kelly ReiningNicole. Her surgery is now on 11/26/2017 at [facility].  All new surgery information given.

## 2017-11-01 NOTE — Telephone Encounter (Signed)
Attempted to contact patient no answer. Was not able to leave a VM Created a CRM

## 2017-11-05 ENCOUNTER — Encounter: Payer: Self-pay | Admitting: Family Medicine

## 2017-11-05 ENCOUNTER — Ambulatory Visit (INDEPENDENT_AMBULATORY_CARE_PROVIDER_SITE_OTHER): Payer: Self-pay | Admitting: Family Medicine

## 2017-11-05 VITALS — BP 124/64 | HR 84 | Temp 97.7°F | Ht 63.0 in | Wt 171.0 lb

## 2017-11-05 DIAGNOSIS — F5101 Primary insomnia: Secondary | ICD-10-CM

## 2017-11-05 DIAGNOSIS — B9789 Other viral agents as the cause of diseases classified elsewhere: Secondary | ICD-10-CM

## 2017-11-05 DIAGNOSIS — J069 Acute upper respiratory infection, unspecified: Secondary | ICD-10-CM

## 2017-11-05 MED ORDER — BENZONATATE 100 MG PO CAPS: 100.0000 mg | ORAL_CAPSULE | Freq: Two times a day (BID) | ORAL | 0 refills | Status: DC | PRN

## 2017-11-05 MED ORDER — ZOLPIDEM TARTRATE 5 MG PO TABS: 5.0000 mg | ORAL_TABLET | Freq: Every evening | ORAL | 1 refills | Status: DC | PRN

## 2017-11-05 NOTE — Progress Notes (Signed)
Subjective:    Patient ID: Kelly Patton, female    DOB: 08/01/1971, 47 y.o.   MRN: [id number]  Chief Complaint  Patient presents with  . Follow-up    HPI Patient was seen today for acute concern.  Pt endorses cough, difficulty sleeping, scratchy throat, some rhinorrhea since Sunday or Monday.  Pt has tried NyQuil for her symptoms.  Pt endorses cough keeping her up at night.  Patient has a history of insomnia.  She was started on Ambien 5 mg as needed.  Patient states she never received her last prescription for this.  Pt states she is exhausted and just wants to rest.  Pt was also being followed by psychiatry in the last few weeks.  Pt states she did not mention her sleep or the feeling of having something crawling in her scalp to her psychiatrist.  Pt states she was started on Prozac, however she has stopped this medication because she did not feel like it was helping.  Pt endorses continued marijuana use, smoking 3 blunts per day.  Past Medical History:  Diagnosis Date  . Carpal tunnel syndrome, right   . Shortness of breath dyspnea    with exertion uses inhaler    No Known Allergies  ROS General: Denies fever, chills, night sweats, changes in weight, changes in appetite HEENT: Denies headaches, ear pain, changes in vision, rhinorrhea    +scratchy throat, some rhinorrhea CV: Denies CP, palpitations, SOB, orthopnea Pulm: Denies SOB, wheezing  + cough GI: Denies abdominal pain, nausea, vomiting, diarrhea, constipation GU: Denies dysuria, hematuria, frequency, vaginal discharge Msk: Denies muscle cramps, joint pains Neuro: Denies weakness, numbness, tingling Skin: Denies rashes, bruising Psych: Denies depression, anxiety, hallucinations     Objective:    Blood pressure 124/64, pulse 84, temperature 97.7 F (36.5 C), temperature source Oral, height 5\' 3"  (1.6 m), weight 171 lb (77.6 kg), SpO2 98 %.   Gen. Pleasant, well-nourished, in no distress, normal affect   HEENT: [place]/AT, face symmetric, no scleral icterus, PERRLA,  nares patent without drainage, pharynx with postnasal drainage on mild erythema, no exudate. Lungs: Occasional dry cough, no accessory muscle use, CTAB, no wheezes or rales Cardiovascular: RRR, no m/r/g, no peripheral edema Neuro:  A&Ox3, CN II-XII intact, normal gait Skin:  Warm, no lesions/ rash   Wt Readings from Last 3 Encounters:  11/05/17 171 lb (77.6 kg)  09/18/17 169 lb 12.8 oz (77 kg)  07/17/17 167 lb 11.2 oz (76.1 kg)    Lab Results  Component Value Date   WBC 5.4 07/04/2017   HGB 13.0 07/04/2017   HCT 39.0 07/04/2017   PLT 348.0 07/04/2017   GLUCOSE 90 07/04/2017   CHOL 163 07/04/2017   TRIG 257.0 (H) 07/04/2017   HDL 64.30 07/04/2017   LDLDIRECT 68.0 07/04/2017   ALT 14 07/04/2017   AST 14 07/04/2017   NA 134 (L) 07/04/2017   K 4.6 07/04/2017   CL 97 07/04/2017   CREATININE 0.70 07/04/2017   BUN 16 07/04/2017   CO2 30 07/04/2017   HGBA1C 5.6 07/04/2017    Assessment/Plan:  Viral URI with cough  -Supportive care - Plan: benzonatate (TESSALON) 100 MG capsule  Primary insomnia -Discussed sleep hygiene -Patient advised to discontinue daily marijuana use -Patient advised to discuss insomnia with her psychiatrist as well as other issues (feeling of bugs crawling in her scalp) -Plan: zolpidem (AMBIEN) 5 MG tablet  F/u prn  Abbe AmsterdamShannon Banks, MD

## 2017-11-05 NOTE — Patient Instructions (Addendum)

## 2017-11-12 ENCOUNTER — Other Ambulatory Visit (INDEPENDENT_AMBULATORY_CARE_PROVIDER_SITE_OTHER): Payer: Self-pay | Admitting: Orthopedic Surgery

## 2017-11-12 DIAGNOSIS — M65311 Trigger thumb, right thumb: Secondary | ICD-10-CM

## 2017-11-18 NOTE — Pre-Procedure Instructions (Signed)
Kelly Patton  11/18/2017      [facility] [place] - [address] [place] [id number] Phone: [phone] Fax: [phone]    Your procedure is scheduled on 11/26/2017.  [facility].M.  Call this number if you have problems the morning of surgery:  [phone]   Remember:  Do not eat food or drink liquids after midnight.   Continue all medications as directed by your physician except follow these medication instructions before surgery below   Take these medicines the morning of surgery with A SIP OF WATER: Acetaminophen -codeine (Tylenol #3) - if needed for pain Bensonatate (Tessalon) - if needed for cough Cetirizine (Zyrtec) Gabapentin (Neurontin) Omeprazole (Prilosec) Oxycodone-acetaminophen (percocet/roxicet) - if needed for pain  7 days prior to surgery STOP taking any Aspirin (unless otherwise instructed by your surgeon), Aleve, Naproxen, Ibuprofen, Motrin, Advil, Goody's, BC's, all herbal medications, fish oil, and all vitamins     Do not wear jewelry, make-up or nail polish.  Do not wear lotions, powders, or perfumes, or deodorant.  Do not shave 48 hours prior to surgery.    Do not bring valuables to the hospital.  [facility] is not responsible for any belongings or valuables.  Hearing aids, eyeglasses, contacts, dentures or bridgework may not be worn into surgery.  Leave your suitcase in the car.  After surgery it may be brought to your room.  For patients admitted to the hospital, discharge time will be determined by your treatment team.  Patients discharged the day of surgery will not be allowed to drive home.   Name and phone number of your driver:    Special instructions:   [facility]- Preparing For Surgery  Before surgery, you can play an important role. Because skin is not sterile, your skin needs to be as free of germs as possible.  You can reduce the number of germs on your skin by washing with CHG (chlorahexidine gluconate) Soap before surgery.  CHG is an antiseptic cleaner which kills germs and bonds with the skin to continue killing germs even after washing.  Please do not use if you have an allergy to CHG or antibacterial soaps. If your skin becomes reddened/irritated stop using the CHG.  Do not shave (including legs and underarms) for at least 48 hours prior to first CHG shower. It is OK to shave your face.  Please follow these instructions carefully.   1. Shower the NIGHT BEFORE SURGERY and the MORNING OF SURGERY with CHG.   2. If you chose to wash your hair, wash your hair first as usual with your normal shampoo.  3. After you shampoo, rinse your hair and body thoroughly to remove the shampoo.  4. Use CHG as you would any other liquid soap. You can apply CHG directly to the skin and wash gently with a scrungie or a clean washcloth.   5. Apply the CHG Soap to your body ONLY FROM THE NECK DOWN.  Do not use on open wounds or open sores. Avoid contact with your eyes, ears, mouth and genitals (private parts). Wash Face and genitals (private parts)  with your normal soap.  6. Wash thoroughly, paying special attention to the area where your surgery will be performed.  7. Thoroughly rinse your body with warm water from the neck down.  8. DO NOT shower/wash with your normal soap after using and rinsing off the CHG  Soap.  9. Pat yourself dry with a CLEAN TOWEL.  10. Wear CLEAN PAJAMAS to bed the night before surgery, wear comfortable clothes the morning of surgery  11. Place CLEAN SHEETS on your bed the night of your first shower and DO NOT SLEEP WITH PETS.    Day of Surgery: Shower as stated above. Do not apply any deodorants/lotions. Please wear clean clothes to the hospital/surgery center.      Please read over the following fact sheets that you were given.

## 2017-11-19 ENCOUNTER — Inpatient Hospital Stay (HOSPITAL_COMMUNITY)
Admission: RE | Admit: 2017-11-19 | Discharge: 2017-11-19 | Disposition: A | Discharge status: Home / Self Care | Payer: BLUE CROSS/BLUE SHIELD | Source: Ambulatory Visit

## 2017-11-19 NOTE — Pre-Procedure Instructions (Signed)
    Kelly Patton  11/19/2017      [facility] [place] - [address] [place] [address] Phone: [phone] Fax: [phone]    Your procedure is scheduled on Tuesday, November 26, 2017  [facility] at 5:30 A.M.  Call this number if you have problems the morning of surgery:  [phone]   Remember:  Do not eat food or drink liquids after midnight Monday, November 25, 2017  Take these medicines the morning of surgery with A SIP OF WATER : cetirizine (ZYRTEC), gabapentin (NEURONTIN), omeprazole (PRILOSEC),  pain medication if needed Stop taking vitamins, fish oil and herbal medications. Do not take any NSAIDs ie: Ibuprofen, Advil, Naproxen (Aleve), Motrin, BC and Goody Powder; stop now.  Do not wear jewelry, make-up or nail polish.  Do not wear lotions, powders, or perfumes, or deodorant.  Do not shave 48 hours prior to surgery.    Do not bring valuables to the hospital.  [facility] is not responsible for any belongings or valuables.  Contacts, dentures or bridgework may not be worn into surgery.  Leave your suitcase in the car.  After surgery it may be brought to your room. For patients admitted to the hospital, discharge time will be determined by your treatment team. Patients discharged the day of surgery will not be allowed to drive home.  Special instructions: Follow instructions sheet for shower Please read over the following fact sheets that you were given. Pain Booklet, Coughing and Deep Breathing and Surgical Site Infection Prevention

## 2017-11-25 ENCOUNTER — Encounter (HOSPITAL_COMMUNITY): Payer: Self-pay | Admitting: *Deleted

## 2017-11-25 ENCOUNTER — Other Ambulatory Visit: Payer: Self-pay

## 2017-11-25 NOTE — Anesthesia Preprocedure Evaluation (Addendum)
Anesthesia Evaluation  Patient identified by MRN, date of birth, ID band Patient awake    Reviewed: Allergy & Precautions, NPO status , Patient's Chart, lab work & pertinent test results  Airway Mallampati: II  TM Distance: >3 FB Neck ROM: Full    Dental no notable dental hx. (+) Teeth Intact   Pulmonary shortness of breath and with exertion, Current Smoker,    Pulmonary exam normal breath sounds clear to auscultation       Cardiovascular hypertension, Pt. on medications Normal cardiovascular exam Rhythm:Regular Rate:Normal  EKG- NSR   Neuro/Psych PSYCHIATRIC DISORDERS Anxiety Depression  Neuromuscular disease    GI/Hepatic Neg liver ROS, GERD  Medicated and Controlled,  Endo/Other  Obesity  Renal/GU negative Renal ROS  negative genitourinary   Musculoskeletal Right trigger thumb   Abdominal (+) + obese,   Peds  Hematology negative hematology ROS (+)   Anesthesia Other Findings   Reproductive/Obstetrics                            Anesthesia Physical Anesthesia Plan  ASA: II  Anesthesia Plan: Bier Block and Bier Block-LIDOCAINE ONLY   Post-op Pain Management:    Induction: Intravenous  PONV Risk Score and Plan: 1 and Ondansetron, Propofol infusion, Dexamethasone and Treatment may vary due to age or medical condition  Airway Management Planned: Natural Airway and Simple Face Mask  Additional Equipment:   Intra-op Plan:   Post-operative Plan:   Informed Consent: I have reviewed the patients History and Physical, chart, labs and discussed the procedure including the risks, benefits and alternatives for the proposed anesthesia with the patient or authorized representative who has indicated his/her understanding and acceptance.     Plan Discussed with: Anesthesiologist, CRNA and Surgeon  Anesthesia Plan Comments:        Anesthesia Quick Evaluation

## 2017-11-25 NOTE — Progress Notes (Signed)
Spoke with pt for pre-op call. Pt denies cardiac history or chest pain. 

## 2017-11-25 NOTE — H&P (Signed)
Kelly Patton is an 47 y.o. female.   Chief Complaint: Right trigger thumb HPI: Kelly Patton is a 47 year old patient with right trigger thumb.  This is been going on for several months.  It is painful and affects her activities of daily living.  She is failed nonoperative conservative management and presents for operative management she reports daily triggering of the thumb.  Past Medical History:  Diagnosis Date  . Anxiety   . Carpal tunnel syndrome, right   . Depression   . GERD (gastroesophageal reflux disease)   . Hypertension   . Shortness of breath dyspnea    with exertion uses inhaler    Past Surgical History:  Procedure Laterality Date  . CARPAL TUNNEL RELEASE Right 01/31/2016   Procedure: CARPAL TUNNEL RELEASE;  Surgeon: Cammy CopaScott Gregory Dean, MD;  [facility];  Service: Orthopedics;  Laterality: Right;  . HAND TENDON SURGERY     right    Family History  Problem Relation Age of Onset  . Hypertension Father   . Hypertension Other    Social History:  reports that she has been smoking cigarettes.  she has never used smokeless tobacco. She reports that she drinks alcohol. She reports that she uses drugs. Drug: Marijuana.  Allergies: No Known Allergies  No medications prior to admission.    No results found for this or any previous visit (from the past 48 hour(s)). No results found.  Review of Systems  Musculoskeletal: Positive for joint pain.  All other systems reviewed and are negative.   Weight 171 lb 1.2 oz (77.6 kg), last menstrual period 11/24/2017. Physical Exam  Constitutional: She appears well-developed.  HENT:  Head: Normocephalic.  Eyes: Pupils are equal, round, and reactive to light.  Neck: Normal range of motion.  Cardiovascular: Normal rate.  Respiratory: Effort normal.  Neurological: She is alert.  Skin: Skin is warm.  Psychiatric: She has a normal mood and affect.  Examination of the right thumb demonstrates triggering and tenderness at  the A1 pulley.  EPL FPL intact.  Range of motion of the thumb passively and actively at the IP joint is tender and painful.  Assessment/Plan Impression is right trigger thumb.  This is been refractory to nonoperative management and has been going on over 6 weeks.  Plan is operative A1 pulley release.  Risk and benefits are discussed including but not limited to infection nerve damage and recurrent triggering.  All questions answered.  Burnard BuntingG Scott Dean, MD 11/25/2017, 11:39 PM

## 2017-11-26 ENCOUNTER — Encounter (HOSPITAL_COMMUNITY): Payer: Self-pay | Admitting: *Deleted

## 2017-11-26 ENCOUNTER — Ambulatory Visit (HOSPITAL_COMMUNITY)
Admission: RE | Admit: 2017-11-26 | Discharge: 2017-11-26 | Disposition: A | Discharge status: Home / Self Care | Payer: Self-pay | Source: Ambulatory Visit | Attending: Orthopedic Surgery | Admitting: Orthopedic Surgery

## 2017-11-26 ENCOUNTER — Ambulatory Visit (HOSPITAL_COMMUNITY): Payer: Self-pay | Admitting: Anesthesiology

## 2017-11-26 ENCOUNTER — Encounter (HOSPITAL_COMMUNITY)
Admission: RE | Disposition: A | Discharge status: Home / Self Care | Payer: Self-pay | Source: Ambulatory Visit | Attending: Orthopedic Surgery

## 2017-11-26 DIAGNOSIS — Z79899 Other long term (current) drug therapy: Secondary | ICD-10-CM | POA: Insufficient documentation

## 2017-11-26 DIAGNOSIS — F329 Major depressive disorder, single episode, unspecified: Secondary | ICD-10-CM | POA: Insufficient documentation

## 2017-11-26 DIAGNOSIS — K219 Gastro-esophageal reflux disease without esophagitis: Secondary | ICD-10-CM | POA: Insufficient documentation

## 2017-11-26 DIAGNOSIS — F419 Anxiety disorder, unspecified: Secondary | ICD-10-CM | POA: Insufficient documentation

## 2017-11-26 DIAGNOSIS — M65311 Trigger thumb, right thumb: Secondary | ICD-10-CM | POA: Insufficient documentation

## 2017-11-26 DIAGNOSIS — F17210 Nicotine dependence, cigarettes, uncomplicated: Secondary | ICD-10-CM | POA: Insufficient documentation

## 2017-11-26 DIAGNOSIS — I10 Essential (primary) hypertension: Secondary | ICD-10-CM | POA: Insufficient documentation

## 2017-11-26 DIAGNOSIS — Z6830 Body mass index (BMI) 30.0-30.9, adult: Secondary | ICD-10-CM | POA: Insufficient documentation

## 2017-11-26 DIAGNOSIS — E669 Obesity, unspecified: Secondary | ICD-10-CM | POA: Insufficient documentation

## 2017-11-26 HISTORY — DX: Anxiety disorder, unspecified: F41.9

## 2017-11-26 HISTORY — DX: Depression, unspecified: F32.A

## 2017-11-26 HISTORY — DX: Major depressive disorder, single episode, unspecified: F32.9

## 2017-11-26 HISTORY — DX: Essential (primary) hypertension: I10

## 2017-11-26 HISTORY — DX: Gastro-esophageal reflux disease without esophagitis: K21.9

## 2017-11-26 HISTORY — PX: TRIGGER FINGER RELEASE: SHX641

## 2017-11-26 LAB — COMPREHENSIVE METABOLIC PANEL
ALK PHOS: 49 U/L (ref 38–126)
ALT: 17 U/L (ref 14–54)
ANION GAP: 11 (ref 5–15)
AST: 17 U/L (ref 15–41)
Albumin: 3.9 g/dL (ref 3.5–5.0)
BILIRUBIN TOTAL: 0.5 mg/dL (ref 0.3–1.2)
BUN: 12 mg/dL (ref 6–20)
CO2: 23 mmol/L (ref 22–32)
CREATININE: 0.78 mg/dL (ref 0.44–1.00)
Calcium: 9.6 mg/dL (ref 8.9–10.3)
Chloride: 102 mmol/L (ref 101–111)
Glucose, Bld: 102 mg/dL — ABNORMAL HIGH (ref 65–99)
Potassium: 3.2 mmol/L — ABNORMAL LOW (ref 3.5–5.1)
Sodium: 136 mmol/L (ref 135–145)
TOTAL PROTEIN: 7.2 g/dL (ref 6.5–8.1)

## 2017-11-26 LAB — CBC
HCT: 37.3 % (ref 36.0–46.0)
Hemoglobin: 12.3 g/dL (ref 12.0–15.0)
MCH: 29.9 pg (ref 26.0–34.0)
MCHC: 33 g/dL (ref 30.0–36.0)
MCV: 90.5 fL (ref 78.0–100.0)
PLATELETS: 339 10*3/uL (ref 150–400)
RBC: 4.12 MIL/uL (ref 3.87–5.11)
RDW: 13.6 % (ref 11.5–15.5)
WBC: 6.2 10*3/uL (ref 4.0–10.5)

## 2017-11-26 LAB — POCT PREGNANCY, URINE: Preg Test, Ur: NEGATIVE

## 2017-11-26 SURGERY — RELEASE, A1 PULLEY, FOR TRIGGER FINGER
Anesthesia: Regional | Laterality: Right

## 2017-11-26 MED ORDER — CEFAZOLIN SODIUM-DEXTROSE 2-4 GM/100ML-% IV SOLN
2.0000 g | INTRAVENOUS | Status: AC
  Administered 2017-11-26: 2 g via INTRAVENOUS
  Filled 2017-11-26: qty 100

## 2017-11-26 MED ORDER — PHENYLEPHRINE HCL 10 MG/ML IJ SOLN
INTRAMUSCULAR | Status: DC | PRN
  Administered 2017-11-26: 80 ug via INTRAVENOUS

## 2017-11-26 MED ORDER — CHLORHEXIDINE GLUCONATE 4 % EX LIQD: 60.0000 mL | Freq: Once | CUTANEOUS | Status: DC

## 2017-11-26 MED ORDER — MIDAZOLAM HCL 2 MG/2ML IJ SOLN
INTRAMUSCULAR | Status: DC | PRN
  Administered 2017-11-26: 2 mg via INTRAVENOUS

## 2017-11-26 MED ORDER — LIDOCAINE HCL (PF) 0.5 % IJ SOLN
INTRAMUSCULAR | Status: DC | PRN
  Administered 2017-11-26: 30 mL via INTRAVENOUS

## 2017-11-26 MED ORDER — LACTATED RINGERS IV SOLN
INTRAVENOUS | Status: DC | PRN
  Administered 2017-11-26: 07:00:00 via INTRAVENOUS

## 2017-11-26 MED ORDER — BUPIVACAINE HCL (PF) 0.25 % IJ SOLN
INTRAMUSCULAR | Status: DC | PRN
  Administered 2017-11-26: 7 mL

## 2017-11-26 MED ORDER — 0.9 % SODIUM CHLORIDE (POUR BTL) OPTIME
TOPICAL | Status: DC | PRN
  Administered 2017-11-26: 1000 mL

## 2017-11-26 MED ORDER — MEPERIDINE HCL 50 MG/ML IJ SOLN: 6.2500 mg | INTRAMUSCULAR | Status: DC | PRN

## 2017-11-26 MED ORDER — METOCLOPRAMIDE HCL 5 MG/ML IJ SOLN: 10.0000 mg | Freq: Once | INTRAMUSCULAR | Status: DC | PRN

## 2017-11-26 MED ORDER — PROPOFOL 500 MG/50ML IV EMUL
INTRAVENOUS | Status: DC | PRN
  Administered 2017-11-26: 100 ug/kg/min via INTRAVENOUS

## 2017-11-26 MED ORDER — FENTANYL CITRATE (PF) 250 MCG/5ML IJ SOLN
INTRAMUSCULAR | Status: AC
  Filled 2017-11-26: qty 5

## 2017-11-26 MED ORDER — BUPIVACAINE HCL (PF) 0.25 % IJ SOLN
INTRAMUSCULAR | Status: AC
  Filled 2017-11-26: qty 10

## 2017-11-26 MED ORDER — MORPHINE SULFATE (PF) 4 MG/ML IV SOLN
INTRAVENOUS | Status: DC | PRN
  Administered 2017-11-26: 8 mg

## 2017-11-26 MED ORDER — OXYCODONE HCL 5 MG PO TABS: 5.0000 mg | ORAL_TABLET | Freq: Once | ORAL | Status: DC

## 2017-11-26 MED ORDER — MIDAZOLAM HCL 2 MG/2ML IJ SOLN
INTRAMUSCULAR | Status: AC
  Filled 2017-11-26: qty 2

## 2017-11-26 MED ORDER — CLONIDINE HCL (ANALGESIA) 100 MCG/ML EP SOLN
150.0000 ug | Freq: Once | EPIDURAL | Status: AC
  Administered 2017-11-26: 1 mL via INTRA_ARTICULAR
  Filled 2017-11-26: qty 10

## 2017-11-26 MED ORDER — OXYCODONE HCL 5 MG PO TABS
ORAL_TABLET | ORAL | Status: AC
  Filled 2017-11-26: qty 1

## 2017-11-26 MED ORDER — FENTANYL CITRATE (PF) 100 MCG/2ML IJ SOLN: 25.0000 ug | INTRAMUSCULAR | Status: DC | PRN

## 2017-11-26 MED ORDER — PROPOFOL 10 MG/ML IV BOLUS
INTRAVENOUS | Status: AC
  Filled 2017-11-26: qty 20

## 2017-11-26 MED ORDER — OXYCODONE HCL 5 MG/5ML PO SOLN: 5.0000 mg | Freq: Once | ORAL | Status: AC | PRN

## 2017-11-26 MED ORDER — FENTANYL CITRATE (PF) 100 MCG/2ML IJ SOLN
INTRAMUSCULAR | Status: DC | PRN
  Administered 2017-11-26: 50 ug via INTRAVENOUS

## 2017-11-26 MED ORDER — MORPHINE SULFATE (PF) 4 MG/ML IV SOLN
INTRAVENOUS | Status: AC
  Filled 2017-11-26: qty 2

## 2017-11-26 MED ORDER — OXYCODONE HCL 5 MG PO TABS
5.0000 mg | ORAL_TABLET | Freq: Once | ORAL | Status: AC | PRN
  Administered 2017-11-26: 5 mg via ORAL

## 2017-11-26 MED ORDER — PROPOFOL 10 MG/ML IV BOLUS
INTRAVENOUS | Status: DC | PRN
  Administered 2017-11-26: 50 mg via INTRAVENOUS

## 2017-11-26 SURGICAL SUPPLY — 42 items
BANDAGE ACE 3X5.8 VEL STRL LF (GAUZE/BANDAGES/DRESSINGS) ×4 IMPLANT
BANDAGE ACE 4X5 VEL STRL LF (GAUZE/BANDAGES/DRESSINGS) ×2 IMPLANT
BNDG CONFORM 3 STRL LF (GAUZE/BANDAGES/DRESSINGS) ×2 IMPLANT
BNDG ELASTIC 2X5.8 VLCR STR LF (GAUZE/BANDAGES/DRESSINGS) ×2 IMPLANT
BNDG ESMARK 4X9 LF (GAUZE/BANDAGES/DRESSINGS) ×2 IMPLANT
BNDG GAUZE ELAST 4 BULKY (GAUZE/BANDAGES/DRESSINGS) ×2 IMPLANT
CORDS BIPOLAR (ELECTRODE) ×2 IMPLANT
COVER SURGICAL LIGHT HANDLE (MISCELLANEOUS) ×2 IMPLANT
CUFF TOURNIQUET SINGLE 18IN (TOURNIQUET CUFF) ×2 IMPLANT
DRAPE SURG 17X23 STRL (DRAPES) ×2 IMPLANT
DRSG TEGADERM 4X4.75 (GAUZE/BANDAGES/DRESSINGS) ×2 IMPLANT
DURAPREP 26ML APPLICATOR (WOUND CARE) ×2 IMPLANT
GAUZE SPONGE 4X4 12PLY STRL (GAUZE/BANDAGES/DRESSINGS) ×4 IMPLANT
GAUZE SPONGE 4X4 12PLY STRL LF (GAUZE/BANDAGES/DRESSINGS) ×2 IMPLANT
GAUZE XEROFORM 1X8 LF (GAUZE/BANDAGES/DRESSINGS) ×2 IMPLANT
GLOVE BIOGEL PI IND STRL 8 (GLOVE) ×1 IMPLANT
GLOVE BIOGEL PI INDICATOR 8 (GLOVE) ×1
GLOVE SURG ORTHO 8.0 STRL STRW (GLOVE) ×2 IMPLANT
GOWN STRL REUS W/ TWL LRG LVL3 (GOWN DISPOSABLE) ×2 IMPLANT
GOWN STRL REUS W/ TWL XL LVL3 (GOWN DISPOSABLE) ×1 IMPLANT
GOWN STRL REUS W/TWL LRG LVL3 (GOWN DISPOSABLE) ×2
GOWN STRL REUS W/TWL XL LVL3 (GOWN DISPOSABLE) ×1
KIT BASIN OR (CUSTOM PROCEDURE TRAY) ×2 IMPLANT
KIT ROOM TURNOVER OR (KITS) ×2 IMPLANT
NDL SAFETY ECLIPSE 18X1.5 (NEEDLE) ×2 IMPLANT
NEEDLE HYPO 18GX1.5 SHARP (NEEDLE) ×2
NEEDLE HYPO 25GX1X1/2 BEV (NEEDLE) ×2 IMPLANT
NS IRRIG 1000ML POUR BTL (IV SOLUTION) ×2 IMPLANT
PACK ORTHO EXTREMITY (CUSTOM PROCEDURE TRAY) ×2 IMPLANT
PAD ARMBOARD 7.5X6 YLW CONV (MISCELLANEOUS) ×4 IMPLANT
PAD CAST 4YDX4 CTTN HI CHSV (CAST SUPPLIES) ×2 IMPLANT
PADDING CAST COTTON 4X4 STRL (CAST SUPPLIES) ×2
SUT ETHILON 3 0 PS 1 (SUTURE) ×2 IMPLANT
SUT VIC AB 2-0 CT1 27 (SUTURE) ×1
SUT VIC AB 2-0 CT1 TAPERPNT 27 (SUTURE) ×1 IMPLANT
SUT VIC AB 3-0 FS2 27 (SUTURE) ×2 IMPLANT
SYR CONTROL 10ML LL (SYRINGE) ×2 IMPLANT
SYRINGE 3CC LL L/F (MISCELLANEOUS) ×2 IMPLANT
TOWEL OR 17X24 6PK STRL BLUE (TOWEL DISPOSABLE) ×2 IMPLANT
TOWEL OR 17X26 10 PK STRL BLUE (TOWEL DISPOSABLE) ×2 IMPLANT
TUBE CONNECTING 12X1/4 (SUCTIONS) ×2 IMPLANT
UNDERPAD 30X30 (UNDERPADS AND DIAPERS) ×2 IMPLANT

## 2017-11-26 NOTE — Brief Op Note (Signed)
11/26/2017  11/26/2017  8:34 AM  PATIENT:  Henrietta DineNicole Michelle Robins  47 y.o. female  PRE-OPERATIVE DIAGNOSIS:  right trigger thumb  POST-OPERATIVE DIAGNOSIS:  right trigger thumb  PROCEDURE:  Procedure(s): RELEASE TRIGGER FINGER RIGHT THUMB  SURGEON:  Surgeon(s): August Saucerean, Corrie MckusickGregory Scott, MD  ASSISTANT: Patrick Jupiterarla Bethune rnfa  ANESTHESIA:   regional  EBL: 1 ml    No intake/output data recorded.  BLOOD ADMINISTERED: none  DRAINS: none   LOCAL MEDICATIONS USED:  Marcaine mso4 clonidine  SPECIMEN:  No Specimen  COUNTS:  YES  TOURNIQUET:   Total Tourniquet Time Documented: Forearm (Right) - 22 minutes Total: Forearm (Right) - 22 minutes   DICTATION: .Other Dictation: Dictation Number [id number]  PLAN OF CARE: Discharge to home after PACU  PATIENT DISPOSITION:  PACU - hemodynamically stable

## 2017-11-26 NOTE — Anesthesia Procedure Notes (Signed)
Anesthesia Regional Block: Bier block (IV Regional)   Pre-Anesthetic Checklist: ,, timeout performed, Correct Patient, Correct Site, Correct Laterality, Correct Procedure, Correct Position, site marked, Risks and benefits discussed,  Surgical consent,  Pre-op evaluation,  At surgeon's request and post-op pain management  Laterality: Right     Needles:  Injection technique: Single-shot      Needle Gauge: 20     Additional Needles:   Procedures:,,,,,,,, #20gu IV placed  Narrative:  Start time: 11/26/2017 7:56 AM End time: 11/26/2017 7:58 AM Injection made incrementally with aspirations every 5 mL.  Performed by: Personally  Anesthesiologist: Mal AmabileFoster, Desi Carby, MD  Additional Notes: Timeout performed. Patient sedated. Relevant anatomy ID'd . Negative radial pulse after tourniquet inflated. LA injected into IV right hand. Patient tolerated procedure well.

## 2017-11-26 NOTE — Transfer of Care (Signed)
Immediate Anesthesia Transfer of Care Note  Patient: Kelly Patton  Procedure(s) Performed: RELEASE TRIGGER FINGER RIGHT THUMB (Right )  Patient Location: PACU  Anesthesia Type:MAC and Bier block  Level of Consciousness: awake, alert  and patient cooperative  Airway & Oxygen Therapy: Patient Spontanous Breathing  Post-op Assessment: Report given to RN and Post -op Vital signs reviewed and stable  Post vital signs: Reviewed and stable  Last Vitals:  Vitals:   11/26/17 0557  BP: 135/88  Pulse: 74  Resp: 20  Temp: 36.6 C  SpO2: 100%    Last Pain:  Vitals:   11/26/17 0557  TempSrc: Oral      Patients Stated Pain Goal: 6 (45/62/56 3893)  Complications: No apparent anesthesia complications

## 2017-11-26 NOTE — Anesthesia Postprocedure Evaluation (Signed)
Anesthesia Post Note  Patient: Kelly Patton  Procedure(s) Performed: RELEASE TRIGGER FINGER RIGHT THUMB (Right )     Patient location during evaluation: PACU Anesthesia Type: Bier Block Level of consciousness: awake and alert and oriented Pain management: pain level controlled Vital Signs Assessment: post-procedure vital signs reviewed and stable Respiratory status: spontaneous breathing, nonlabored ventilation and respiratory function stable Cardiovascular status: blood pressure returned to baseline and stable Postop Assessment: no apparent nausea or vomiting Anesthetic complications: no    Last Vitals:  Vitals:   11/26/17 0846 11/26/17 0854  BP: 117/83   Pulse: 78   Resp: 12   Temp:  (!) 36.3 C  SpO2: 100%     Last Pain:  Vitals:   11/26/17 0854  TempSrc:   PainSc: 4                  Marrell Dicaprio A.

## 2017-11-26 NOTE — Interval H&P Note (Signed)
History and Physical Interval Note:  11/26/2017 7:07 AM  Kelly Patton  has presented today for surgery, with the diagnosis of right trigger thumb  The various methods of treatment have been discussed with the patient and family. After consideration of risks, benefits and other options for treatment, the patient has consented to  Procedure(s): RELEASE TRIGGER FINGER RIGHT THUMB (Right) as a surgical intervention .  The patient's history has been reviewed, patient examined, no change in status, stable for surgery.  I have reviewed the patient's chart and labs.  Questions were answered to the patient's satisfaction.     Burnard BuntingG Scott Jadynn Epping

## 2017-11-26 NOTE — Op Note (Signed)
NAME:  Otilio CarpenWILLIAMS, Shelli             ACCOUNT NO.:  [id number]  MEDICAL RECORD NO.:  [id number]  LOCATION:                                 FACILITY:  PHYSICIAN:  Burnard BuntingG. Scott Agueda Houpt, M.D.    DATE OF BIRTH:  [birth year]  DATE OF PROCEDURE:  11/26/2017 DATE OF DISCHARGE:  11/26/2017                              OPERATIVE REPORT   PREOPERATIVE DIAGNOSIS:  Right trigger thumb.  POSTOPERATIVE DIAGNOSIS:  Right trigger thumb.  PROCEDURE:  Right trigger thumb release.  SURGEON ATTENDING:  Burnard BuntingG. Scott Lujean Ebright, MD.  ASSISTANT:  Patrick Jupiterarla Bethune, RNFA.  INDICATIONS:  Joni Reiningicole is a 47 year old patient with right trigger thumb. It has been refractory to nonoperative management.  It bothers her a lot at work.  The patient presents for operative management after explanation of risks and benefits.  PROCEDURE IN DETAIL:  The patient was brought to the operating room where IV regional anesthetic was induced.  Preoperative IV antibiotics were administered.  Time-out was called.  Right hand prescrubbed with alcohol and Betadine, allowed to air dry, prepped with DuraPrep solution and draped in a sterile manner.  IV regional anesthetic was induced prior to prepping and draping.  An incision was made over the proximal thumb flexion crease on the right-hand side.  The A1 pulley was identified.  Neurovascular structures mobilized ulnarly and radially. Under direct visualization, the A1 pulley was released.  Thumb was taken through a range of motion and found to have no triggering.  At this time, thorough irrigation was performed and a solution of plain Marcaine, morphine, and clonidine was injected into the skin edges.  The tourniquet was released.  Bleeding points encountered and controlled using electrocautery with bipolar.  Three simple nylon sutures placed and a waterproof dressing and a compressive dressing then applied to the base of the thumb.  The patient was transferred to the recovery room in stable  condition.     Burnard BuntingG. Scott Jessi Pitstick, M.D.     GSD/MEDQ  D:  11/26/2017  T:  11/26/2017  Job:  [id number]

## 2017-11-27 ENCOUNTER — Encounter (HOSPITAL_COMMUNITY): Payer: Self-pay | Admitting: Orthopedic Surgery

## 2017-12-06 ENCOUNTER — Inpatient Hospital Stay (INDEPENDENT_AMBULATORY_CARE_PROVIDER_SITE_OTHER): Payer: BLUE CROSS/BLUE SHIELD | Admitting: Orthopedic Surgery

## 2017-12-09 ENCOUNTER — Ambulatory Visit (INDEPENDENT_AMBULATORY_CARE_PROVIDER_SITE_OTHER): Payer: BLUE CROSS/BLUE SHIELD | Admitting: Orthopedic Surgery

## 2017-12-09 ENCOUNTER — Encounter (INDEPENDENT_AMBULATORY_CARE_PROVIDER_SITE_OTHER): Payer: Self-pay | Admitting: Orthopedic Surgery

## 2017-12-09 DIAGNOSIS — M65311 Trigger thumb, right thumb: Secondary | ICD-10-CM

## 2017-12-09 MED ORDER — IBUPROFEN-FAMOTIDINE 800-26.6 MG PO TABS: 1.0000 | ORAL_TABLET | Freq: Two times a day (BID) | ORAL | 0 refills | Status: DC

## 2017-12-09 MED ORDER — OXYCODONE-ACETAMINOPHEN 5-325 MG PO TABS: 1.0000 | ORAL_TABLET | Freq: Two times a day (BID) | ORAL | 0 refills | Status: DC

## 2017-12-09 NOTE — Progress Notes (Signed)
   Post-Op Visit Note   Patient: Kelly Patton           Date of Birth: April 01, 1971           MRN: [id number] Visit Date: 12/09/2017 PCP: Deeann SaintBanks, Shannon R, MD   Assessment & Plan:  Chief Complaint:  Chief Complaint  Patient presents with  . Right Hand - Routine Post Op   Visit Diagnoses:  1. Trigger thumb, right thumb     Plan: Kelly Patton is a patient who is 13 days out right trigger thumb release.  Generally doing okay.  Having some pain around the A1 pulley.  On exam she has flexion at the IP joint to 90 degrees.  No triggering.  Sutures removed today.  Refill Percocet x1 and also Duexis.  Start scar massage this weekend.  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  No lifting greater than 5 pounds with that right hand for 3 weeks.  After 3 weeks she can resume regular duty.  Follow-up with me as needed  Follow-Up Instructions: Return if symptoms worsen or fail to improve.   Orders:  No orders of the defined types were placed in this encounter.  Meds ordered this encounter  Medications  . oxyCODONE-acetaminophen (PERCOCET/ROXICET) 5-325 MG tablet    Sig: Take 1 tablet by mouth every 12 (twelve) hours.    Dispense:  20 tablet    Refill:  0  . Ibuprofen-Famotidine 800-26.6 MG TABS    Sig: Take 1 tablet by mouth 2 (two) times daily.    Dispense:  60 tablet    Refill:  0    Imaging: No results found.  PMFS History: Patient Active Problem List   Diagnosis Date Noted  . Trigger thumb, right thumb 12/09/2017  . HTN (hypertension) 07/17/2017  . Primary insomnia 07/17/2017  . Thumb pain, right 04/08/2017   Past Medical History:  Diagnosis Date  . Anxiety   . Carpal tunnel syndrome, right   . Depression   . GERD (gastroesophageal reflux disease)   . Hypertension   . Shortness of breath dyspnea    with exertion uses inhaler    Family History  Problem Relation Age of Onset  . Hypertension Father    . Hypertension Other     Past Surgical History:  Procedure Laterality Date  . CARPAL TUNNEL RELEASE Right 01/31/2016   Procedure: CARPAL TUNNEL RELEASE;  Surgeon: Cammy CopaScott Jakera Beaupre, MD;  [facility];  Service: Orthopedics;  Laterality: Right;  . HAND TENDON SURGERY     right  . TRIGGER FINGER RELEASE Right 11/26/2017   Procedure: RELEASE TRIGGER FINGER RIGHT THUMB;  Surgeon: Cammy Copaean, Khylan Sawyer Scott, MD;  [facility];  Service: Orthopedics;  Laterality: Right;   Social History   Occupational History  . Not on file  Tobacco Use  . Smoking status: Current Some Day Smoker    Types: Cigarettes  . Smokeless tobacco: Never Used  . Tobacco comment: Smokes 1 cigarette/week for 2 years  Substance and Sexual Activity  . Alcohol use: Yes    Comment: once a week; fifth of liquor  . Drug use: Yes    Frequency: 1.0 times per week    Types: Marijuana    Comment: daily,last 11/25/17  . Sexual activity: Yes    Birth control/protection: None

## 2017-12-16 ENCOUNTER — Telehealth (INDEPENDENT_AMBULATORY_CARE_PROVIDER_SITE_OTHER): Payer: Self-pay | Admitting: Orthopedic Surgery

## 2017-12-16 NOTE — Telephone Encounter (Signed)
I verified with pharmacy. They gave her a little over half, she received #14 tablets. This was done b/c of the way the rx was written for her to take 1 po bid prn. I tried calling patient to discuss. No answer. LMVM for her advising that based off of [place] regulations there was not anything that we could do about this b/c it is [place] law that they only have to provide patient with 7 day supply of narcotic at a time.  Advised on VM she could call back to discuss if she was unclear.

## 2017-12-16 NOTE — Telephone Encounter (Signed)
Patient called req a refill stated that pharmacy would only give her 1/2 the prescription.  oxyCODONE-acetaminophen (PERCOCET/ROXICET) 5-325   Please call patient to advise.  .Marland Kitchen

## 2018-02-24 ENCOUNTER — Ambulatory Visit: Payer: BLUE CROSS/BLUE SHIELD | Admitting: Family Medicine

## 2018-03-03 ENCOUNTER — Encounter: Payer: Self-pay | Admitting: Family Medicine

## 2018-03-03 ENCOUNTER — Ambulatory Visit: Payer: BLUE CROSS/BLUE SHIELD | Admitting: Family Medicine

## 2018-03-03 VITALS — BP 158/108 | HR 102 | Temp 98.8°F | Wt 158.0 lb

## 2018-03-03 DIAGNOSIS — N946 Dysmenorrhea, unspecified: Secondary | ICD-10-CM

## 2018-03-03 DIAGNOSIS — I10 Essential (primary) hypertension: Secondary | ICD-10-CM | POA: Diagnosis not present

## 2018-03-03 DIAGNOSIS — Z113 Encounter for screening for infections with a predominantly sexual mode of transmission: Secondary | ICD-10-CM

## 2018-03-03 LAB — BASIC METABOLIC PANEL
BUN: 14 mg/dL (ref 6–23)
CO2: 27 meq/L (ref 19–32)
CREATININE: 0.73 mg/dL (ref 0.40–1.20)
Calcium: 9.5 mg/dL (ref 8.4–10.5)
Chloride: 102 mEq/L (ref 96–112)
GFR: 109.57 mL/min (ref >60.00)
Glucose, Bld: 92 mg/dL (ref 70–99)
POTASSIUM: 3.8 meq/L (ref 3.5–5.1)
SODIUM: 137 meq/L (ref 135–145)

## 2018-03-03 MED ORDER — CHLORTHALIDONE 25 MG PO TABS: 25.0000 mg | ORAL_TABLET | Freq: Every day | ORAL | 1 refills | Status: DC

## 2018-03-03 MED ORDER — MELOXICAM 7.5 MG PO TABS: 7.5000 mg | ORAL_TABLET | Freq: Every day | ORAL | 2 refills | Status: DC

## 2018-03-03 NOTE — Progress Notes (Signed)
Subjective:    Patient ID: Kelly Patton, female    DOB: December 22, 1970, 47 y.o.   MRN: [id number]  No chief complaint on file.   HPI Patient was seen today for f/u.  Pt requesting STI testing. In a new relationship with a female partner.  Pt endorses increased intake of EtOH.  A pint may last 1 day.  Pt states her gf likes to drink.  Pt continues to smoke marijuana almost daily.  Pt doing well at job at [employer].  Pt is hoping to start a work program with [employer] soon.  HTN:  Pt stopped taking her meds as she thought losing wt would control her bp.  Pt has lost 12 lbs since Feb.  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  Menstrual cramps:  Pt currently on menses.  Endorses increased painful cramping.  States Ibuprofen 800 mg has not been helping.  Past Medical History:  Diagnosis Date  . Anxiety   . Carpal tunnel syndrome, right   . Depression   . GERD (gastroesophageal reflux disease)   . Hypertension   . Shortness of breath dyspnea    with exertion uses inhaler    No Known Allergies  ROS General: Denies fever, chills, night sweats, changes in weight, changes in appetite HEENT: Denies headaches, ear pain, changes in vision, rhinorrhea, sore throat CV: Denies CP, palpitations, SOB, orthopnea Pulm: Denies SOB, cough, wheezing GI: Denies abdominal pain, nausea, vomiting, diarrhea, constipation GU: Denies dysuria, hematuria, frequency, vaginal discharge Msk: Denies muscle cramps, joint pains  +menstrual cramps Neuro: Denies weakness, numbness, tingling Skin: Denies rashes, bruising Psych: Denies depression, anxiety, hallucinations     Objective:    Blood pressure (!) 158/108, pulse (!) 102, temperature 98.8 F (37.1 C), temperature source Oral, weight 158 lb (71.7 kg), SpO2 98 %.   Gen. Pleasant, well-nourished, in no distress, normal affect   HEENT: [place]/AT, face symmetric, no scleral icterus, PERRLA, nares patent without drainage Lungs: no  accessory muscle use, CTAB, no wheezes or rales Cardiovascular: RRR, no m/r/g, no peripheral edema Neuro:  A&Ox3, CN II-XII intact, normal gait Skin:  Warm, no lesions/ rash   Wt Readings from Last 3 Encounters:  03/03/18 158 lb (71.7 kg)  11/26/17 173 lb (78.5 kg)  11/05/17 171 lb (77.6 kg)    Lab Results  Component Value Date   WBC 6.2 11/26/2017   HGB 12.3 11/26/2017   HCT 37.3 11/26/2017   PLT 339 11/26/2017   GLUCOSE 102 (H) 11/26/2017   CHOL 163 07/04/2017   TRIG 257.0 (H) 07/04/2017   HDL 64.30 07/04/2017   LDLDIRECT 68.0 07/04/2017   ALT 17 11/26/2017   AST 17 11/26/2017   NA 136 11/26/2017   K 3.2 (L) 11/26/2017   CL 102 11/26/2017   CREATININE 0.78 11/26/2017   BUN 12 11/26/2017   CO2 23 11/26/2017   HGBA1C 5.6 07/04/2017    Assessment/Plan:  Routine screening for STI (sexually transmitted infection)  -discussed safe sex practices. - Plan: HIV antibody (with reflex), RPR, C. trachomatis/N. gonorrhoeae RNA  Essential hypertension  -recheck bp -pt encouraged to restart bp meds.   -discussed lifestyle modifications including decreasing EtOH intake. - Plan: Basic metabolic panel, chlorthalidone (HYGROTON) 25 MG tablet  Menstrual cramps  -supportive care including heat - Plan: meloxicam (MOBIC) 7.5 MG tablet  F/u prn  Abbe AmsterdamShannon Nyna Chilton, MD

## 2018-03-04 LAB — C. TRACHOMATIS/N. GONORRHOEAE RNA
C. trachomatis RNA, TMA: NOT DETECTED
N. GONORRHOEAE RNA, TMA: NOT DETECTED

## 2018-03-04 LAB — HIV ANTIBODY (ROUTINE TESTING W REFLEX): HIV 1&2 Ab, 4th Generation: NONREACTIVE

## 2018-03-04 LAB — RPR: RPR Ser Ql: NONREACTIVE

## 2018-03-14 ENCOUNTER — Telehealth: Payer: Self-pay | Admitting: Family Medicine

## 2018-03-14 NOTE — Telephone Encounter (Signed)
Left normal results on voicemail Advised that these results will be mailed to her home address.  Pt advised to call back if any questions.  Nothing further needed.

## 2018-03-14 NOTE — Telephone Encounter (Signed)
Copied from CRM [phone]. Topic: Quick Communication - See Telephone Encounter >> Mar 14, 2018  2:56 PM Lorrine KinMcGee, Coti Burd B, NT wrote: CRM for notification. See Telephone encounter for: 03/14/18. Patient calling and states that she has not received her lab results in the mail yet. Would like to know if these have been mailed to her? Please advise.

## 2018-05-07 ENCOUNTER — Encounter: Payer: Self-pay | Admitting: Family Medicine

## 2018-05-07 ENCOUNTER — Ambulatory Visit (INDEPENDENT_AMBULATORY_CARE_PROVIDER_SITE_OTHER): Payer: BLUE CROSS/BLUE SHIELD | Admitting: Family Medicine

## 2018-05-07 VITALS — BP 128/100 | HR 96 | Temp 97.9°F | Wt 165.0 lb

## 2018-05-07 DIAGNOSIS — R600 Localized edema: Secondary | ICD-10-CM

## 2018-05-07 DIAGNOSIS — I10 Essential (primary) hypertension: Secondary | ICD-10-CM

## 2018-05-07 DIAGNOSIS — M792 Neuralgia and neuritis, unspecified: Secondary | ICD-10-CM

## 2018-05-07 MED ORDER — CHLORTHALIDONE 25 MG PO TABS: 25.0000 mg | ORAL_TABLET | Freq: Every day | ORAL | 2 refills | Status: DC

## 2018-05-07 MED ORDER — GABAPENTIN 100 MG PO CAPS: 100.0000 mg | ORAL_CAPSULE | Freq: Two times a day (BID) | ORAL | 3 refills | Status: DC

## 2018-05-07 NOTE — Progress Notes (Signed)
Subjective:    Patient ID: Kelly Patton, female    DOB: May 10, 1971, 47 y.o.   MRN: [id number]  No chief complaint on file.   HPI Patient was seen today for ongoing concerns and f/u.  Pt notes right ankle/leg pain x2 weeks.  States the pain starts in her ankle and goes up to her knee.  Increased pain with standing at work.  Pt wears non skid work clogs.  Pt states her girlfriend endorses mild edema.  Pain is described as sharp in the knee leg and right arm.  Pt has tried Tylenol and ibuprofen 800 mg.  Pt asked for a few oxycodone for the pain.  HTN: -Pt states out of meds x months. -Pt told chlorthalidone was $100 at pharmacy. -Pt did not think to call the office for replacement.  Insomnia: -endorses continued issues with insomnia. -States he may fall asleep but wakes up a few hours later  Pt hopes to start a new job in the next few weeks.  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  Past Medical History:  Diagnosis Date  . Anxiety   . Carpal tunnel syndrome, right   . Depression   . GERD (gastroesophageal reflux disease)   . Hypertension   . Shortness of breath dyspnea    with exertion uses inhaler    No Known Allergies  ROS General: Denies fever, chills, night sweats, changes in weight, changes in appetite HEENT: Denies headaches, ear pain, changes in vision, rhinorrhea, sore throat CV: Denies CP, palpitations, SOB, orthopnea Pulm: Denies SOB, cough, wheezing GI: Denies abdominal pain, nausea, vomiting, diarrhea, constipation GU: Denies dysuria, hematuria, frequency, vaginal discharge Msk: Denies muscle cramps, joint pains  +R leg and arm pain.  R leg edema. Neuro: Denies weakness, numbness, tingling Skin: Denies rashes, bruising Psych: Denies depression, anxiety, hallucinations     Objective:    Blood pressure (!) 128/100, pulse 96, temperature 97.9 F (36.6 C), temperature source Oral, weight 165 lb (74.8 kg),  SpO2 98 %.   Gen. Pleasant, well-nourished, in no distress, normal affect   HEENT: [place]/AT, face symmetric, no scleral icterus, PERRLA, nares patent without drainage Lungs: no accessory muscle use, CTAB, no wheezes or rales Cardiovascular: RRR, no m/r/g, no peripheral edema Musculoskeletal: Mild TTP of lateral left knee joint line.  Mild TTP of the lateral right knee joint line.  No edema noted.  No crepitus.  No calf tenderness.  Negative Homans sign.  No deformities, no cyanosis or clubbing, normal tone Neuro:  A&Ox3, CN II-XII intact, normal gait Skin:  Warm, no lesions/ rash   Wt Readings from Last 3 Encounters:  05/07/18 165 lb (74.8 kg)  03/03/18 158 lb (71.7 kg)  11/26/17 173 lb (78.5 kg)    Lab Results  Component Value Date   WBC 6.2 11/26/2017   HGB 12.3 11/26/2017   HCT 37.3 11/26/2017   PLT 339 11/26/2017   GLUCOSE 92 03/03/2018   CHOL 163 07/04/2017   TRIG 257.0 (H) 07/04/2017   HDL 64.30 07/04/2017   LDLDIRECT 68.0 07/04/2017   ALT 17 11/26/2017   AST 17 11/26/2017   NA 137 03/03/2018   K 3.8 03/03/2018   CL 102 03/03/2018   CREATININE 0.73 03/03/2018   BUN 14 03/03/2018   CO2 27 03/03/2018   HGBA1C 5.6 07/04/2017    Assessment/Plan:  Essential hypertension  -BP uncontrolled -New Rx for chlorthalidone written. -Patient given medication savings coupons -Patient  advised to contact office if medication price is still high. -Patient also encouraged to decrease sodium intake, increase p.o. intake of water, increase physical activity. - Plan: chlorthalidone (HYGROTON) 25 MG tablet  Neuralgia  - Plan: gabapentin (NEURONTIN) 100 MG capsule  Lower extremity edema -Discussed wearing supportive shoes and socks to work. -Patient encouraged to elevate feet when sitting -Given handout.  Follow-up in 1 month for BP.  Abbe Amsterdam, MD

## 2018-05-07 NOTE — Patient Instructions (Signed)
Edema Edema is when you have too much fluid in your body or under your skin. Edema may make your legs, feet, and ankles swell up. Swelling is also common in looser tissues, like around your eyes. This is a common condition. It gets more common as you get older. There are many possible causes of edema. Eating too much salt (sodium) and being on your feet or sitting for a long time can cause edema in your legs, feet, and ankles. Hot weather may make edema worse. Edema is usually painless. Your skin may look swollen or shiny. Follow these instructions at home:  Keep the swollen body part raised (elevated) above the level of your heart when you are sitting or lying down.  Do not sit still or stand for a long time.  Do not wear tight clothes. Do not wear garters on your upper legs.  Exercise your legs. This can help the swelling go down.  Wear elastic bandages or support stockings as told by your doctor.  Eat a low-salt (low-sodium) diet to reduce fluid as told by your doctor.  Depending on the cause of your swelling, you may need to limit how much fluid you drink (fluid restriction).  Take over-the-counter and prescription medicines only as told by your doctor. Contact a doctor if:  Treatment is not working.  You have heart, liver, or kidney disease and have symptoms of edema.  You have sudden and unexplained weight gain. Get help right away if:  You have shortness of breath or chest pain.  You cannot breathe when you lie down.  You have pain, redness, or warmth in the swollen areas.  You have heart, liver, or kidney disease and get edema all of a sudden.  You have a fever and your symptoms get worse all of a sudden. Summary  Edema is when you have too much fluid in your body or under your skin.  Edema may make your legs, feet, and ankles swell up. Swelling is also common in looser tissues, like around your eyes.  Raise (elevate) the swollen body part above the level of your  heart when you are sitting or lying down.  Follow your doctor's instructions about diet and how much fluid you can drink (fluid restriction). This information is not intended to replace advice given to you by your health care provider. Make sure you discuss any questions you have with your health care provider. Document Released: 02/13/2008 Document Revised: 09/14/2016 Document Reviewed: 09/14/2016 Elsevier Interactive Patient Education  2017 [employer]. Insomnia Insomnia is a sleep disorder that makes it difficult to fall asleep or to stay asleep. Insomnia can cause tiredness (fatigue), low energy, difficulty concentrating, mood swings, and poor performance at work or school. There are three different ways to classify insomnia:  Difficulty falling asleep.  Difficulty staying asleep.  Waking up too early in the morning.  Any type of insomnia can be long-term (chronic) or short-term (acute). Both are common. Short-term insomnia usually lasts for three months or less. Chronic insomnia occurs at least three times a week for longer than three months. What are the causes? Insomnia may be caused by another condition, situation, or substance, such as:  Anxiety.  Certain medicines.  Gastroesophageal reflux disease (GERD) or other gastrointestinal conditions.  Asthma or other breathing conditions.  Restless legs syndrome, sleep apnea, or other sleep disorders.  Chronic pain.  Menopause. This may include hot flashes.  Stroke.  Abuse of alcohol, tobacco, or illegal drugs.  Depression.  Caffeine.  Neurological disorders, such as Alzheimer disease.  An overactive thyroid (hyperthyroidism).  The cause of insomnia may not be known. What increases the risk? Risk factors for insomnia include:  Gender. Women are more commonly affected than men.  Age. Insomnia is more common as you get older.  Stress. This may involve your professional or personal life.  Income. Insomnia is  more common in people with lower income.  Lack of exercise.  Irregular work schedule or night shifts.  Traveling between different time zones.  What are the signs or symptoms? If you have insomnia, trouble falling asleep or trouble staying asleep is the main symptom. This may lead to other symptoms, such as:  Feeling fatigued.  Feeling nervous about going to sleep.  Not feeling rested in the morning.  Having trouble concentrating.  Feeling irritable, anxious, or depressed.  How is this treated? Treatment for insomnia depends on the cause. If your insomnia is caused by an underlying condition, treatment will focus on addressing the condition. Treatment may also include:  Medicines to help you sleep.  Counseling or therapy.  Lifestyle adjustments.  Follow these instructions at home:  Take medicines only as directed by your health care provider.  Keep regular sleeping and waking hours. Avoid naps.  Keep a sleep diary to help you and your health care provider figure out what could be causing your insomnia. Include: ? When you sleep. ? When you wake up during the night. ? How well you sleep. ? How rested you feel the next day. ? Any side effects of medicines you are taking. ? What you eat and drink.  Make your bedroom a comfortable place where it is easy to fall asleep: ? Put up shades or special blackout curtains to block light from outside. ? Use a white noise machine to block noise. ? Keep the temperature cool.  Exercise regularly as directed by your health care provider. Avoid exercising right before bedtime.  Use relaxation techniques to manage stress. Ask your health care provider to suggest some techniques that may work well for you. These may include: ? Breathing exercises. ? Routines to release muscle tension. ? Visualizing peaceful scenes.  Cut back on alcohol, caffeinated beverages, and cigarettes, especially close to bedtime. These can disrupt your  sleep.  Do not overeat or eat spicy foods right before bedtime. This can lead to digestive discomfort that can make it hard for you to sleep.  Limit screen use before bedtime. This includes: ? Watching TV. ? Using your smartphone, tablet, and computer.  Stick to a routine. This can help you fall asleep faster. Try to do a quiet activity, brush your teeth, and go to bed at the same time each night.  Get out of bed if you are still awake after 15 minutes of trying to sleep. Keep the lights down, but try reading or doing a quiet activity. When you feel sleepy, go back to bed.  Make sure that you drive carefully. Avoid driving if you feel very sleepy.  Keep all follow-up appointments as directed by your health care provider. This is important. Contact a health care provider if:  You are tired throughout the day or have trouble in your daily routine due to sleepiness.  You continue to have sleep problems or your sleep problems get worse. Get help right away if:  You have serious thoughts about hurting yourself or someone else. This information is not intended to replace advice given to you by your health care provider. Make  sure you discuss any questions you have with your health care provider. Document Released: 08/24/2000 Document Revised: 01/27/2016 Document Reviewed: 05/28/2014 Elsevier Interactive Patient Education  [employer].

## 2018-05-09 ENCOUNTER — Telehealth: Payer: Self-pay | Admitting: Family Medicine

## 2018-05-09 NOTE — Telephone Encounter (Signed)
Copied from CRM [id number]. Topic: Quick Communication - See Telephone Encounter >> May 09, 2018 11:17 AM Lorrine KinMcGee, Colisha Redler B, NT wrote: CRM for notification. See Telephone encounter for: 05/09/18. Patient calling and states that she was at the pharmacy and they did not have her prescription for Ambien. [id number] [place] [facility] [place] - [address] States that she would like for her gabapentin (NEURONTIN) 100 MG capsule          [facility] on [address]. Please advise.

## 2018-05-11 ENCOUNTER — Encounter: Payer: Self-pay | Admitting: Family Medicine

## 2018-05-15 ENCOUNTER — Telehealth: Payer: Self-pay | Admitting: Family Medicine

## 2018-05-15 ENCOUNTER — Other Ambulatory Visit: Payer: Self-pay | Admitting: Family Medicine

## 2018-05-15 DIAGNOSIS — M792 Neuralgia and neuritis, unspecified: Secondary | ICD-10-CM

## 2018-05-15 DIAGNOSIS — G4700 Insomnia, unspecified: Secondary | ICD-10-CM

## 2018-05-15 MED ORDER — GABAPENTIN 100 MG PO CAPS: 100.0000 mg | ORAL_CAPSULE | Freq: Two times a day (BID) | ORAL | 3 refills | Status: DC

## 2018-05-15 MED ORDER — ZOLPIDEM TARTRATE 5 MG PO TABS: 5.0000 mg | ORAL_TABLET | Freq: Every evening | ORAL | 0 refills | Status: DC | PRN

## 2018-05-15 NOTE — Telephone Encounter (Signed)
Refills sent

## 2018-05-15 NOTE — Telephone Encounter (Signed)
Copied from CRM [id number]. Topic: Quick Communication - See Telephone Encounter >> May 15, 2018  2:29 PM Lorrine Kin, NT wrote: CRM for notification. See Telephone encounter for: 05/15/18. Patient would like a call back from Dr Salomon Fick only. Would like to discuss the lower back pain she is having. States that Dr Salomon Fick knows about this pain. Very addiment about getting a call back today. States that is very important.   CB#: [phone]

## 2018-05-16 ENCOUNTER — Ambulatory Visit: Payer: BLUE CROSS/BLUE SHIELD | Admitting: Family Medicine

## 2018-05-16 ENCOUNTER — Encounter: Payer: Self-pay | Admitting: Family Medicine

## 2018-05-16 ENCOUNTER — Ambulatory Visit (INDEPENDENT_AMBULATORY_CARE_PROVIDER_SITE_OTHER): Payer: BLUE CROSS/BLUE SHIELD | Admitting: Family Medicine

## 2018-05-16 VITALS — BP 130/90 | HR 99 | Temp 98.6°F

## 2018-05-16 DIAGNOSIS — M545 Low back pain, unspecified: Secondary | ICD-10-CM

## 2018-05-16 DIAGNOSIS — Z23 Encounter for immunization: Secondary | ICD-10-CM

## 2018-05-16 MED ORDER — PREDNISONE 10 MG PO TABS: ORAL_TABLET | ORAL | 0 refills | Status: DC

## 2018-05-16 MED ORDER — CYCLOBENZAPRINE HCL 10 MG PO TABS: 10.0000 mg | ORAL_TABLET | Freq: Three times a day (TID) | ORAL | 0 refills | Status: DC | PRN

## 2018-05-16 NOTE — Patient Instructions (Signed)

## 2018-05-16 NOTE — Progress Notes (Signed)
  Subjective:     Patient ID: Kelly Patton, female   DOB: [birth year], 47 y.o.   MRN: [id number]  HPI Patient seen as a work in with right lower lumbar back pain for the past few days. Denies specific injury. She works at [employer] and her job requires frequent lifting and bending. Her pain has been fairly severe at times and relatively constant. No radiculitis symptoms. She denies any dysuria. Pain is worse with movement. She has taken Tylenol and ibuprofen without relief.  She had recent prescription for meloxicam which also did not help. Denies any prior history of back surgery.No fevers or chills. No abdominal pain.  Past Medical History:  Diagnosis Date  . Anxiety   . Carpal tunnel syndrome, right   . Depression   . GERD (gastroesophageal reflux disease)   . Hypertension   . Shortness of breath dyspnea    with exertion uses inhaler   Past Surgical History:  Procedure Laterality Date  . CARPAL TUNNEL RELEASE Right 01/31/2016   Procedure: CARPAL TUNNEL RELEASE;  Surgeon: Cammy Copa, MD;  Location: [facility] OR;  Service: Orthopedics;  Laterality: Right;  . HAND TENDON SURGERY     right  . TRIGGER FINGER RELEASE Right 11/26/2017   Procedure: RELEASE TRIGGER FINGER RIGHT THUMB;  Surgeon: Cammy Copa, MD;  Location: [facility] OR;  Service: Orthopedics;  Laterality: Right;    reports that she has been smoking cigarettes. She has never used smokeless tobacco. She reports that she drinks alcohol. She reports that she has current or past drug history. Drug: Marijuana. Frequency: 1.00 time per week. family history includes Hypertension in her father and other. No Known Allergies   Review of Systems  Constitutional: Negative for appetite change, chills, fever and unexpected weight change.  Respiratory: Negative for shortness of breath.   Cardiovascular: Negative for chest pain.  Gastrointestinal: Negative for abdominal pain.  Genitourinary: Negative for dysuria, flank pain  and hematuria.  Musculoskeletal: Positive for back pain.  Neurological: Negative for weakness and numbness.       Objective:   Physical Exam  Constitutional: She appears well-developed and well-nourished.  Cardiovascular: Normal rate and regular rhythm.  Pulmonary/Chest: Effort normal and breath sounds normal.  Musculoskeletal: She exhibits no edema.  Neurological:  Full-strength lower extremities. Symmetric reflexes knee and ankle bilaterally.       Assessment:     Acute right lumbar back pain. She's had similar pain in the past with current episode starting 3 days ago. Non-focal neuro exam.    Plan:     -Flexeril 10 mg daily at bedtime when necessary muscle spasm -Prednisone taper over the next week -Reviewed some simple back stretches -Work note written for tomorrow and Sunday -Follow-up with primary back pain not improving over the next couple weeks  Kristian Covey MD [facility]

## 2018-05-16 NOTE — Telephone Encounter (Signed)
Noted  

## 2018-05-16 NOTE — Telephone Encounter (Signed)
Message was addresses by dr Salomon Fick

## 2018-05-31 ENCOUNTER — Encounter (HOSPITAL_COMMUNITY): Payer: Self-pay | Admitting: Emergency Medicine

## 2018-05-31 ENCOUNTER — Emergency Department (HOSPITAL_COMMUNITY)
Admission: EM | Admit: 2018-05-31 | Discharge: 2018-05-31 | Disposition: A | Discharge status: Home / Self Care | Payer: Self-pay | Attending: Emergency Medicine | Admitting: Emergency Medicine

## 2018-05-31 ENCOUNTER — Other Ambulatory Visit: Payer: Self-pay

## 2018-05-31 ENCOUNTER — Emergency Department (HOSPITAL_COMMUNITY): Payer: Self-pay

## 2018-05-31 DIAGNOSIS — Z79899 Other long term (current) drug therapy: Secondary | ICD-10-CM | POA: Insufficient documentation

## 2018-05-31 DIAGNOSIS — F17210 Nicotine dependence, cigarettes, uncomplicated: Secondary | ICD-10-CM | POA: Insufficient documentation

## 2018-05-31 DIAGNOSIS — I10 Essential (primary) hypertension: Secondary | ICD-10-CM | POA: Insufficient documentation

## 2018-05-31 DIAGNOSIS — M5136 Other intervertebral disc degeneration, lumbar region: Secondary | ICD-10-CM | POA: Insufficient documentation

## 2018-05-31 MED ORDER — MELOXICAM 7.5 MG PO TABS: 7.5000 mg | ORAL_TABLET | Freq: Every day | ORAL | 0 refills | Status: DC

## 2018-05-31 MED ORDER — KETOROLAC TROMETHAMINE 15 MG/ML IJ SOLN
15.0000 mg | Freq: Once | INTRAMUSCULAR | Status: AC
  Administered 2018-05-31: 15 mg via INTRAMUSCULAR
  Filled 2018-05-31: qty 1

## 2018-05-31 MED ORDER — DICLOFENAC SODIUM 25 MG PO TBEC: 25.0000 mg | DELAYED_RELEASE_TABLET | Freq: Two times a day (BID) | ORAL | 0 refills | Status: AC

## 2018-05-31 NOTE — ED Notes (Signed)
Patient transported to X-ray 

## 2018-05-31 NOTE — ED Provider Notes (Signed)
[facility] EMERGENCY DEPARTMENT Provider Note   CSN: [id number] Arrival date & time: 05/31/18  1144     History   Chief Complaint Chief Complaint  Patient presents with  . Back Pain    HPI Kelly Patton is a 47 y.o. female.  47 year old female presents with complaint of pain in her right lower back for the last 1 to 2 months.  Patient has been to her PCP twice, given muscle relaxer and anti-inflammatory which did not seem to help, return to PCP and was given prednisone which gave temporary relief.  Patient denies falls or injuries.  States pain does not radiate anywhere, is somewhat worse with movement, sharp in nature, improved somewhat with applying pressure to her right lower back.  Denies fevers, abdominal pain, changes in bowel or bladder habits, groin numbness.  No other complaints or concerns.     Past Medical History:  Diagnosis Date  . Anxiety   . Carpal tunnel syndrome, right   . Depression   . GERD (gastroesophageal reflux disease)   . Hypertension   . Shortness of breath dyspnea    with exertion uses inhaler    Patient Active Problem List   Diagnosis Date Noted  . Trigger thumb, right thumb 12/09/2017  . HTN (hypertension) 07/17/2017  . Primary insomnia 07/17/2017  . Thumb pain, right 04/08/2017    Past Surgical History:  Procedure Laterality Date  . CARPAL TUNNEL RELEASE Right 01/31/2016   Procedure: CARPAL TUNNEL RELEASE;  Surgeon: Cammy Copa, MD;  Location: [facility] OR;  Service: Orthopedics;  Laterality: Right;  . HAND TENDON SURGERY     right  . TRIGGER FINGER RELEASE Right 11/26/2017   Procedure: RELEASE TRIGGER FINGER RIGHT THUMB;  Surgeon: Cammy Copa, MD;  Location: [facility] OR;  Service: Orthopedics;  Laterality: Right;     OB History   None      Home Medications    Prior to Admission medications   Medication Sig Start Date End Date Taking? Authorizing Provider  Aspirin-Salicylamide-Caffeine (BC  HEADACHE POWDER PO) Take 2 packets by mouth daily as needed (general pain).    [provider]  cetirizine (ZYRTEC) 10 MG tablet Take 1 tablet (10 mg total) by mouth daily. 06/04/17   [name], MD  chlorthalidone (HYGROTON) 25 MG tablet Take 1 tablet (25 mg total) by mouth daily. 05/07/18   [name], MD  cyclobenzaprine (FLEXERIL) 10 MG tablet Take 1 tablet (10 mg total) by mouth 3 (three) times daily as needed for muscle spasms. 05/16/18   Burchette, Elberta Fortis, MD  gabapentin (NEURONTIN) 100 MG capsule Take 1 capsule (100 mg total) by mouth 2 (two) times daily. 05/15/18   [name], MD  Ibuprofen-Famotidine 800-26.6 MG TABS Take 1 tablet by mouth 2 (two) times daily. 12/09/17   Cammy Copa, MD  meloxicam (MOBIC) 7.5 MG tablet Take 1 tablet (7.5 mg total) by mouth daily for 10 days. 05/31/18 06/10/18  Jeannie Fend, PA-C  zolpidem (AMBIEN) 5 MG tablet Take 1 tablet (5 mg total) by mouth at bedtime as needed for sleep. 05/15/18   [name], MD    Family History Family History  Problem Relation Age of Onset  . Hypertension Father   . Hypertension Other     Social History Social History   Tobacco Use  . Smoking status: Current Some Day Smoker    Types: Cigarettes  . Smokeless tobacco: Never Used  . Tobacco comment:  Smokes 1 cigarette/week for 2 years  Substance Use Topics  . Alcohol use: Yes    Comment: once a week; fifth of liquor  . Drug use: Yes    Frequency: 1.0 times per week    Types: Marijuana    Comment: daily,last 11/25/17     Allergies   Patient has no known allergies.   Review of Systems Review of Systems  Constitutional: Negative for chills and fever.  Gastrointestinal: Negative for abdominal pain.  Genitourinary: Negative for difficulty urinating.  Musculoskeletal: Positive for back pain, gait problem and myalgias.  Skin: Negative for rash and wound.  Allergic/Immunologic: Negative for immunocompromised state.  Neurological:  Negative for weakness and numbness.  Hematological: Does not bruise/bleed easily.  Psychiatric/Behavioral: Negative for confusion.  All other systems reviewed and are negative.    Physical Exam Updated Vital Signs BP 117/82 (BP Location: Right Arm)   Pulse (!) 106   Temp 98.2 F (36.8 C)   Resp 18   Ht 5' 2.5" (1.588 m)   Wt 71.2 kg   LMP 05/17/2018   SpO2 99%   BMI 28.26 kg/m   Physical Exam  Constitutional: She is oriented to person, place, and time. She appears well-developed and well-nourished. No distress.  HENT:  Head: Normocephalic and atraumatic.  Cardiovascular: Intact distal pulses.  Pulmonary/Chest: Effort normal.  Abdominal: Soft. She exhibits no distension. There is no tenderness.  Musculoskeletal: She exhibits tenderness. She exhibits no deformity.       Back:  Neurological: She is alert and oriented to person, place, and time. She displays normal reflexes. No sensory deficit.  Skin: Skin is warm and dry. No rash noted. She is not diaphoretic. No erythema.  Psychiatric: She has a normal mood and affect. Her behavior is normal.  Nursing note and vitals reviewed.    ED Treatments / Results  Labs (all labs ordered are listed, but only abnormal results are displayed) Labs Reviewed - No data to display  EKG None  Radiology Dg Lumbar Spine Complete  Result Date: 05/31/2018 CLINICAL DATA:  Back pain for 3 weeks. No acute injury. Pain started radiating into the right leg today. EXAM: LUMBAR SPINE - COMPLETE 4+ VIEW COMPARISON:  None. FINDINGS: Five lumbar type vertebral bodies. The alignment is normal. There is moderately advanced disc space narrowing at L5-S1 with associated endplate osteophytes. The additional disc spaces are preserved. No evidence of acute fracture or pars defect. Mild facet degenerative changes are present inferiorly. Multiple pelvic calcifications are likely phleboliths. IMPRESSION: No acute findings or malalignment. Degenerative disc  disease at L5-S1. Lower lumbar facet hypertrophy. Electronically Signed   By: Carey BullocksWilliam  Veazey M.D.   On: 05/31/2018 14:30    Procedures Procedures (including critical care time)  Medications Ordered in ED Medications  ketorolac (TORADOL) 15 MG/ML injection 15 mg (15 mg Intramuscular Given 05/31/18 1330)     Initial Impression / Assessment and Plan / ED Course  I have reviewed the triage vital signs and the nursing notes.  Pertinent labs & imaging results that were available during my care of the patient were reviewed by me and considered in my medical decision making (see chart for details).  Clinical Course as of May 31 1438  Sat May 31, 2018  1438 46yo female with right lower back pain, no relief with Motrin and Flexeril at home, had limite relief with Prednisone. No falls or injuries, right low back tenderness on exam, no midline/bony tenderness, reflexes symmetric. XR with DDD L5/S1. Given Toradol  while in the Er, rx for Meloxicam to take with her Flexeril, follow up with PCP.   [LM]    Clinical Course User Index [LM] Jeannie Fend, PA-C    Final Clinical Impressions(s) / ED Diagnoses   Final diagnoses:  Degenerative disc disease, lumbar    ED Discharge Orders         Ordered    meloxicam (MOBIC) 7.5 MG tablet  Daily     05/31/18 1436           Jeannie Fend, PA-C 05/31/18 1439    Gilda Crease, MD 06/01/18 [phone]

## 2018-05-31 NOTE — Discharge Instructions (Addendum)
Warm compresses to low back for 20 minutes at a time. Take meloxicam as prescribed. Continue with Flexeril as previously prescribed. Recheck with your family doctor.

## 2018-05-31 NOTE — ED Triage Notes (Signed)
Pt. Stated, this episode of back pain started about 3 weeks ago . I went to my Dr 2 weeks ago and its really bad now. 1st time was over a month ago.

## 2018-06-04 ENCOUNTER — Encounter: Payer: Self-pay | Admitting: Family Medicine

## 2018-06-04 ENCOUNTER — Ambulatory Visit (INDEPENDENT_AMBULATORY_CARE_PROVIDER_SITE_OTHER): Payer: Self-pay | Admitting: Family Medicine

## 2018-06-04 VITALS — BP 104/82 | HR 104 | Temp 98.4°F | Wt 165.0 lb

## 2018-06-04 DIAGNOSIS — M545 Low back pain, unspecified: Secondary | ICD-10-CM

## 2018-06-04 MED ORDER — GABAPENTIN 300 MG PO CAPS: 300.0000 mg | ORAL_CAPSULE | Freq: Two times a day (BID) | ORAL | 3 refills | Status: DC

## 2018-06-04 NOTE — Patient Instructions (Signed)
Back Pain, Adult Many adults have back pain from time to time. Common causes of back pain include:  A strained muscle or ligament.  Wear and tear (degeneration) of the spinal disks.  Arthritis.  A hit to the back.  Back pain can be short-lived (acute) or last a long time (chronic). A physical exam, lab tests, and imaging studies may be done to find the cause of your pain. Follow these instructions at home: Managing pain and stiffness  Take over-the-counter and prescription medicines only as told by your health care provider.  If directed, apply heat to the affected area as often as told by your health care provider. Use the heat source that your health care provider recommends, such as a moist heat pack or a heating pad. ? Place a towel between your skin and the heat source. ? Leave the heat on for 20-30 minutes. ? Remove the heat if your skin turns bright red. This is especially important if you are unable to feel pain, heat, or cold. You have a greater risk of getting burned.  If directed, apply ice to the injured area: ? Put ice in a plastic bag. ? Place a towel between your skin and the bag. ? Leave the ice on for 20 minutes, 2-3 times a day for the first 2-3 days. Activity  Do not stay in bed. Resting more than 1-2 days can delay your recovery.  Take short walks on even surfaces as soon as you are able. Try to increase the length of time you walk each day.  Do not sit, drive, or stand in one place for more than 30 minutes at a time. Sitting or standing for long periods of time can put stress on your back.  Use proper lifting techniques. When you bend and lift, use positions that put less stress on your back: ? Bend your knees. ? Keep the load close to your body. ? Avoid twisting.  Exercise regularly as told by your health care provider. Exercising will help your back heal faster. This also helps prevent back injuries by keeping muscles strong and flexible.  Your health  care provider may recommend that you see a physical therapist. This person can help you come up with a safe exercise program. Do any exercises as told by your physical therapist. Lifestyle  Maintain a healthy weight. Extra weight puts stress on your back and makes it difficult to have good posture.  Avoid activities or situations that make you feel anxious or stressed. Learn ways to manage anxiety and stress. One way to manage stress is through exercise. Stress and anxiety increase muscle tension and can make back pain worse. General instructions  Sleep on a firm mattress in a comfortable position. Try lying on your side with your knees slightly bent. If you lie on your back, put a pillow under your knees.  Follow your treatment plan as told by your health care provider. This may include: ? Cognitive or behavioral therapy. ? Acupuncture or massage therapy. ? Meditation or yoga. Contact a health care provider if:  You have pain that is not relieved with rest or medicine.  You have increasing pain going down into your legs or buttocks.  Your pain does not improve in 2 weeks.  You have pain at night.  You lose weight.  You have a fever or chills. Get help right away if:  You develop new bowel or bladder control problems.  You have unusual weakness or numbness in your arms   or legs.  You develop nausea or vomiting.  You develop abdominal pain.  You feel faint. Summary  Many adults have back pain from time to time. A physical exam, lab tests, and imaging studies may be done to find the cause of your pain.  Use proper lifting techniques. When you bend and lift, use positions that put less stress on your back.  Take over-the-counter and prescription medicines and apply heat or ice as directed by your health care provider. This information is not intended to replace advice given to you by your health care provider. Make sure you discuss any questions you have with your health care  provider. Document Released: 08/27/2005 Document Revised: 10/01/2016 Document Reviewed: 10/01/2016 Elsevier Interactive Patient Education  2018 [employer]  Back Exercises If you have pain in your back, do these exercises 2-3 times each day or as told by your doctor. When the pain goes away, do the exercises once each day, but repeat the steps more times for each exercise (do more repetitions). If you do not have pain in your back, do these exercises once each day or as told by your doctor. Exercises Single Knee to Chest  Do these steps 3-5 times in a row for each leg: 1. Lie on your back on a firm bed or the floor with your legs stretched out. 2. Bring one knee to your chest. 3. Hold your knee to your chest by grabbing your knee or thigh. 4. Pull on your knee until you feel a gentle stretch in your lower back. 5. Keep doing the stretch for 10-30 seconds. 6. Slowly let go of your leg and straighten it.  Pelvic Tilt  Do these steps 5-10 times in a row: 1. Lie on your back on a firm bed or the floor with your legs stretched out. 2. Bend your knees so they point up to the ceiling. Your feet should be flat on the floor. 3. Tighten your lower belly (abdomen) muscles to press your lower back against the floor. This will make your tailbone point up to the ceiling instead of pointing down to your feet or the floor. 4. Stay in this position for 5-10 seconds while you gently tighten your muscles and breathe evenly.  Cat-Cow  Do these steps until your lower back bends more easily: 1. Get on your hands and knees on a firm surface. Keep your hands under your shoulders, and keep your knees under your hips. You may put padding under your knees. 2. Let your head hang down, and make your tailbone point down to the floor so your lower back is round like the back of a cat. 3. Stay in this position for 5 seconds. 4. Slowly lift your head and make your tailbone point up to the ceiling so your back hangs  low (sags) like the back of a cow. 5. Stay in this position for 5 seconds.  Press-Ups  Do these steps 5-10 times in a row: 1. Lie on your belly (face-down) on the floor. 2. Place your hands near your head, about shoulder-width apart. 3. While you keep your back relaxed and keep your hips on the floor, slowly straighten your arms to raise the top half of your body and lift your shoulders. Do not use your back muscles. To make yourself more comfortable, you may change where you place your hands. 4. Stay in this position for 5 seconds. 5. Slowly return to lying flat on the floor.  Bridges  Do these steps   10 times in a row: 1. Lie on your back on a firm surface. 2. Bend your knees so they point up to the ceiling. Your feet should be flat on the floor. 3. Tighten your butt muscles and lift your butt off of the floor until your waist is almost as high as your knees. If you do not feel the muscles working in your butt and the back of your thighs, slide your feet 1-2 inches farther away from your butt. 4. Stay in this position for 3-5 seconds. 5. Slowly lower your butt to the floor, and let your butt muscles relax.  If this exercise is too easy, try doing it with your arms crossed over your chest. Belly Crunches  Do these steps 5-10 times in a row: 1. Lie on your back on a firm bed or the floor with your legs stretched out. 2. Bend your knees so they point up to the ceiling. Your feet should be flat on the floor. 3. Cross your arms over your chest. 4. Tip your chin a little bit toward your chest but do not bend your neck. 5. Tighten your belly muscles and slowly raise your chest just enough to lift your shoulder blades a tiny bit off of the floor. 6. Slowly lower your chest and your head to the floor.  Back Lifts Do these steps 5-10 times in a row: 1. Lie on your belly (face-down) with your arms at your sides, and rest your forehead on the floor. 2. Tighten the muscles in your legs and  your butt. 3. Slowly lift your chest off of the floor while you keep your hips on the floor. Keep the back of your head in line with the curve in your back. Look at the floor while you do this. 4. Stay in this position for 3-5 seconds. 5. Slowly lower your chest and your face to the floor.  Contact a doctor if:  Your back pain gets a lot worse when you do an exercise.  Your back pain does not lessen 2 hours after you exercise. If you have any of these problems, stop doing the exercises. Do not do them again unless your doctor says it is okay. Get help right away if:  You have sudden, very bad back pain. If this happens, stop doing the exercises. Do not do them again unless your doctor says it is okay. This information is not intended to replace advice given to you by your health care provider. Make sure you discuss any questions you have with your health care provider. Document Released: 09/29/2010 Document Revised: 02/02/2016 Document Reviewed: 10/21/2014 Elsevier Interactive Patient Education  2018 [employer] Inc.  

## 2018-06-04 NOTE — Progress Notes (Signed)
Subjective:    Patient ID: Kelly Patton, female    DOB: 1970-09-21, 47 y.o.   MRN: [id number]  No chief complaint on file.   HPI Patient was seen today for back pain.  Patient endorses ongoing back pain since end of August.  Patient seen in ED on 05/31/2018 noted to have degenerative disc disease.  Patient states Mobic and Flexeril did not help her pain.  Pt states she took 5 ibuprofen, 3 extra strength Tylenol, 2 BC powder with little relief.  Pt also notes she purchased pills off the street which helped.  Pt requesting a referral to a specialist.  Past Medical History:  Diagnosis Date  . Anxiety   . Carpal tunnel syndrome, right   . Depression   . GERD (gastroesophageal reflux disease)   . Hypertension   . Shortness of breath dyspnea    with exertion uses inhaler    No Known Allergies  ROS General: Denies fever, chills, night sweats, changes in weight, changes in appetite HEENT: Denies headaches, ear pain, changes in vision, rhinorrhea, sore throat CV: Denies CP, palpitations, SOB, orthopnea Pulm: Denies SOB, cough, wheezing GI: Denies abdominal pain, nausea, vomiting, diarrhea, constipation GU: Denies dysuria, hematuria, frequency, vaginal discharge Msk: Denies muscle cramps, joint pains  +back pain Neuro: Denies weakness, numbness, tingling Skin: Denies rashes, bruising Psych: Denies depression, anxiety, hallucinations     Objective:    Blood pressure 104/82, pulse (!) 104, temperature 98.4 F (36.9 C), temperature source Oral, weight 165 lb (74.8 kg), last menstrual period 05/17/2018, SpO2 99 %.   Gen. Pleasant, well-nourished, in no distress, normal affect   HEENT: [place]/AT, face symmetric, no scleral icterus, PERRLA, nares patent without drainage. Lungs: no accessory muscle use Cardiovascular: RRR, no peripheral edema Neuro:  A&Ox3, CN II-XII intact, normal gait   Wt Readings from Last 3 Encounters:  06/04/18 165 lb (74.8 kg)  05/31/18 157 lb (71.2 kg)   05/07/18 165 lb (74.8 kg)    Lab Results  Component Value Date   WBC 6.2 11/26/2017   HGB 12.3 11/26/2017   HCT 37.3 11/26/2017   PLT 339 11/26/2017   GLUCOSE 92 03/03/2018   CHOL 163 07/04/2017   TRIG 257.0 (H) 07/04/2017   HDL 64.30 07/04/2017   LDLDIRECT 68.0 07/04/2017   ALT 17 11/26/2017   AST 17 11/26/2017   NA 137 03/03/2018   K 3.8 03/03/2018   CL 102 03/03/2018   CREATININE 0.73 03/03/2018   BUN 14 03/03/2018   CO2 27 03/03/2018   HGBA1C 5.6 07/04/2017    Assessment/Plan:  Lumbar back pain  -DDD at L5-S1 and lower lumbar facet hyrpertrophy noted on xray - Plan: gabapentin (NEURONTIN) 300 MG capsule, Ambulatory referral to Sports Medicine  Pt given notes for work [employer])  F/u prn  Abbe AmsterdamShannon Lattie Cervi, MD

## 2018-06-18 ENCOUNTER — Encounter: Payer: Self-pay | Admitting: Family Medicine

## 2018-07-10 ENCOUNTER — Emergency Department (HOSPITAL_COMMUNITY)
Admission: EM | Admit: 2018-07-10 | Discharge: 2018-07-10 | Disposition: A | Discharge status: Home / Self Care | Payer: Self-pay | Attending: Emergency Medicine | Admitting: Emergency Medicine

## 2018-07-10 ENCOUNTER — Encounter (HOSPITAL_COMMUNITY): Payer: Self-pay

## 2018-07-10 ENCOUNTER — Other Ambulatory Visit: Payer: Self-pay

## 2018-07-10 DIAGNOSIS — I10 Essential (primary) hypertension: Secondary | ICD-10-CM | POA: Insufficient documentation

## 2018-07-10 DIAGNOSIS — K0889 Other specified disorders of teeth and supporting structures: Secondary | ICD-10-CM

## 2018-07-10 DIAGNOSIS — K047 Periapical abscess without sinus: Secondary | ICD-10-CM

## 2018-07-10 DIAGNOSIS — F17210 Nicotine dependence, cigarettes, uncomplicated: Secondary | ICD-10-CM | POA: Insufficient documentation

## 2018-07-10 DIAGNOSIS — Z79899 Other long term (current) drug therapy: Secondary | ICD-10-CM | POA: Insufficient documentation

## 2018-07-10 MED ORDER — TRAMADOL HCL 50 MG PO TABS: 50.0000 mg | ORAL_TABLET | Freq: Four times a day (QID) | ORAL | 0 refills | Status: DC | PRN

## 2018-07-10 MED ORDER — OXYCODONE-ACETAMINOPHEN 5-325 MG PO TABS
1.0000 | ORAL_TABLET | ORAL | Status: DC | PRN
  Administered 2018-07-10: 1 via ORAL
  Filled 2018-07-10: qty 1

## 2018-07-10 MED ORDER — PENICILLIN V POTASSIUM 500 MG PO TABS: 500.0000 mg | ORAL_TABLET | Freq: Four times a day (QID) | ORAL | 0 refills | Status: AC

## 2018-07-10 MED ORDER — PENICILLIN V POTASSIUM 250 MG PO TABS
500.0000 mg | ORAL_TABLET | Freq: Once | ORAL | Status: AC
  Administered 2018-07-10: 500 mg via ORAL
  Filled 2018-07-10: qty 2

## 2018-07-10 MED ORDER — BUPIVACAINE-EPINEPHRINE (PF) 0.5% -1:200000 IJ SOLN
1.8000 mL | Freq: Once | INTRAMUSCULAR | Status: AC
  Administered 2018-07-10: 1.8 mL
  Filled 2018-07-10: qty 1.8

## 2018-07-10 NOTE — Discharge Instructions (Signed)
Take Penicillin 4 times daily for one week Please see Dr. Leanord Asal to follow up Continue Ibuprofen 600mg  three times daily Take Tramadol for more severe pain. Do not drink alcohol or drive while taking this medicine

## 2018-07-10 NOTE — ED Notes (Signed)
ED Provider at bedside. 

## 2018-07-10 NOTE — ED Provider Notes (Signed)
[facility] EMERGENCY DEPARTMENT Provider Note   CSN: [id number] Arrival date & time: 07/10/18  1007     History   Chief Complaint Chief Complaint  Patient presents with  . Dental Pain    HPI Kelly Patton is a 47 y.o. female who presents with left upper dental pain.  Past medical history significant for anxiety, depression, GERD, hypertension.  She states that over the past couple of days she has had worsening pain in her left upper molar.  She has been taking ibuprofen and naproxen without significant relief.  Her face has been swelling on the left side.  She denies fever but has felt hot at times.  She does not have a dentist.  She is been able to swallow.  She was given a Percocet in triage and states that this significantly helped her pain.  HPI  Past Medical History:  Diagnosis Date  . Anxiety   . Carpal tunnel syndrome, right   . Depression   . GERD (gastroesophageal reflux disease)   . Hypertension   . Shortness of breath dyspnea    with exertion uses inhaler    Patient Active Problem List   Diagnosis Date Noted  . Trigger thumb, right thumb 12/09/2017  . HTN (hypertension) 07/17/2017  . Primary insomnia 07/17/2017  . Thumb pain, right 04/08/2017    Past Surgical History:  Procedure Laterality Date  . CARPAL TUNNEL RELEASE Right 01/31/2016   Procedure: CARPAL TUNNEL RELEASE;  Surgeon: Cammy Copa, MD;  Location: [facility] OR;  Service: Orthopedics;  Laterality: Right;  . HAND TENDON SURGERY     right  . TRIGGER FINGER RELEASE Right 11/26/2017   Procedure: RELEASE TRIGGER FINGER RIGHT THUMB;  Surgeon: Cammy Copa, MD;  Location: [facility] OR;  Service: Orthopedics;  Laterality: Right;     OB History   None      Home Medications    Prior to Admission medications   Medication Sig Start Date End Date Taking? Authorizing Provider  Aspirin-Salicylamide-Caffeine (BC HEADACHE POWDER PO) Take 2 packets by mouth daily as needed  (general pain).    [provider]  cetirizine (ZYRTEC) 10 MG tablet Take 1 tablet (10 mg total) by mouth daily. 06/04/17   [name], MD  chlorthalidone (HYGROTON) 25 MG tablet Take 1 tablet (25 mg total) by mouth daily. 05/07/18   [name], MD  cyclobenzaprine (FLEXERIL) 10 MG tablet Take 1 tablet (10 mg total) by mouth 3 (three) times daily as needed for muscle spasms. 05/16/18   Burchette, Elberta Fortis, MD  gabapentin (NEURONTIN) 300 MG capsule Take 1 capsule (300 mg total) by mouth 2 (two) times daily. 06/04/18   [name], MD  Ibuprofen-Famotidine 800-26.6 MG TABS Take 1 tablet by mouth 2 (two) times daily. 12/09/17   Cammy Copa, MD  penicillin v potassium (VEETID) 500 MG tablet Take 1 tablet (500 mg total) by mouth 4 (four) times daily for 7 days. 07/10/18 07/17/18  Bethel Born, PA-C  traMADol (ULTRAM) 50 MG tablet Take 1 tablet (50 mg total) by mouth every 6 (six) hours as needed. 07/10/18   Bethel Born, PA-C  zolpidem (AMBIEN) 5 MG tablet Take 1 tablet (5 mg total) by mouth at bedtime as needed for sleep. 05/15/18   [name], MD    Family History Family History  Problem Relation Age of Onset  . Hypertension Father   . Hypertension Other     Social History  Social History   Tobacco Use  . Smoking status: Current Some Day Smoker    Types: Cigarettes  . Smokeless tobacco: Never Used  . Tobacco comment: Smokes 1 cigarette/week for 2 years  Substance Use Topics  . Alcohol use: Yes    Comment: once a week; fifth of liquor  . Drug use: Yes    Frequency: 1.0 times per week    Types: Marijuana    Comment: daily,last 11/25/17     Allergies   Patient has no known allergies.   Review of Systems Review of Systems  Constitutional: Negative for fever.  HENT: Positive for dental problem and facial swelling.      Physical Exam Updated Vital Signs BP (!) 135/121 (BP Location: Right Arm) Comment: pt states she is having problems  getting her BP meds and has been off for a while  Pulse 66   Temp 98.9 F (37.2 C) (Oral)   Resp 18   SpO2 98%   Physical Exam  Constitutional: She is oriented to person, place, and time. She appears well-developed and well-nourished. No distress.  Talkative  HENT:  Head: Normocephalic and atraumatic.  Mouth/Throat: Uvula is midline. There is trismus (mild) in the jaw. Dental abscesses present.    Mild left sided facial swelling  Eyes: Pupils are equal, round, and reactive to light. Conjunctivae are normal. Right eye exhibits no discharge. Left eye exhibits no discharge. No scleral icterus.  Neck: Normal range of motion.  Cardiovascular: Normal rate.  Pulmonary/Chest: Effort normal. No respiratory distress.  Abdominal: She exhibits no distension.  Neurological: She is alert and oriented to person, place, and time.  Skin: Skin is warm and dry.  Psychiatric: She has a normal mood and affect. Her behavior is normal.  Nursing note and vitals reviewed.    ED Treatments / Results  Labs (all labs ordered are listed, but only abnormal results are displayed) Labs Reviewed - No data to display  EKG None  Radiology No results found.  Procedures .Nerve Block Date/Time: 07/10/2018 1:42 PM Performed by: Bethel Born, PA-C Authorized by: Bethel Born, PA-C   Consent:    Consent obtained:  Verbal   Consent given by:  Patient   Risks discussed:  Bleeding   Alternatives discussed:  No treatment Indications:    Indications:  Pain relief and procedural anesthesia Location:    Body area:  Head   Head nerve blocked: dental block.   Laterality:  Left Skin anesthesia (see MAR for exact dosages):    Skin anesthesia method:  Local infiltration   Local anesthetic:  Bupivacaine 0.5% WITH epi Procedure details (see MAR for exact dosages):    Block needle gauge:  27 G   Anesthetic injected:  Bupivacaine 0.5% WITH epi   Steroid injected:  None   Additive injected:  None    Injection procedure:  Anatomic landmarks identified, incremental injection, negative aspiration for blood, introduced needle and anatomic landmarks palpated   Paresthesia:  None Post-procedure details:    Dressing:  None   Outcome:  Pain improved   Patient tolerance of procedure:  Tolerated well, no immediate complications   (including critical care time)    Medications Ordered in ED Medications  oxyCODONE-acetaminophen (PERCOCET/ROXICET) 5-325 MG per tablet 1 tablet (1 tablet Oral Given 07/10/18 1019)  bupivacaine-epinephrine (MARCAINE W/ EPI) 0.5% -1:200000 injection 1.8 mL (1.8 mLs Infiltration Given 07/10/18 1223)  penicillin v potassium (VEETID) tablet 500 mg (500 mg Oral Given 07/10/18 1311)  Initial Impression / Assessment and Plan / ED Course  I have reviewed the triage vital signs and the nursing notes.  Pertinent labs & imaging results that were available during my care of the patient were reviewed by me and considered in my medical decision making (see chart for details).  Dental pain associated with dental caries and possible dental infection. Patient is afebrile, non toxic appearing, and swallowing secretions well. No concerning findings on exam. There was a small dental abscess. A dental block was performed and a small hole was made with 18 gauge needle. Very minimal purulent drainage was expressed. I gave patient referral to dentist and stressed the importance of dental follow up for ultimate management of dental pain. Discussed findings, treatment, and follow up  with patient.  Pt given return precautions.  Pt verbalizes understanding and agrees with plan. Patient expresses understanding and agrees with plan. She was given rx for Tramadol and PCN.    Final Clinical Impressions(s) / ED Diagnoses   Final diagnoses:  Pain, dental  Dental abscess    ED Discharge Orders         Ordered    traMADol (ULTRAM) 50 MG tablet  Every 6 hours PRN     07/10/18 1319     penicillin v potassium (VEETID) 500 MG tablet  4 times daily     07/10/18 1319           Bethel Born, PA-C 07/10/18 1344    Raeford Razor, MD 07/10/18 [phone]

## 2018-07-10 NOTE — ED Triage Notes (Signed)
Pt presents for evaluation of L sided dental and face pain x 2-3 days. Reports has been taking aleve and oragel with minimal improvement.

## 2018-08-21 ENCOUNTER — Encounter: Payer: Self-pay | Admitting: *Deleted

## 2018-08-21 ENCOUNTER — Ambulatory Visit (INDEPENDENT_AMBULATORY_CARE_PROVIDER_SITE_OTHER): Payer: Self-pay | Admitting: Internal Medicine

## 2018-08-21 ENCOUNTER — Encounter: Payer: Self-pay | Admitting: Internal Medicine

## 2018-08-21 VITALS — BP 140/90 | HR 96 | Temp 98.2°F | Wt 166.2 lb

## 2018-08-21 DIAGNOSIS — J069 Acute upper respiratory infection, unspecified: Secondary | ICD-10-CM

## 2018-08-21 DIAGNOSIS — I10 Essential (primary) hypertension: Secondary | ICD-10-CM

## 2018-08-21 MED ORDER — CHLORTHALIDONE 25 MG PO TABS: 25.0000 mg | ORAL_TABLET | Freq: Every day | ORAL | 2 refills | Status: DC

## 2018-08-21 NOTE — Patient Instructions (Signed)
-  It was nice seeing you today!  -Take Zyrtec 10 mg daily.  -Make sure you take your blood pressure medication every day.  -Will schedule follow-up with Dr. Salomon FickBanks in 6 to 8 weeks for blood pressure check.

## 2018-08-21 NOTE — Progress Notes (Signed)
Acute Office Visit     CC/Reason for Visit: "I have a cold", medication refills  HPI: Kelly Patton is a 47 y.o. female who is coming in today for the above mentioned reasons. Past Medical History is significant for: Hypertension that has not been well controlled as she has been off her chlorthalidone for 2 months due to financial reasons.  For the past week or so she has been having a scratchy throat, sneezing, congestion, runny nose, "yucky stuff is dripping into my throat".  She has tried NyQuil over-the-counter without significant relief.  She wants "the pill that Dr. Salomon Fick gave me last year for my allergies". She has not had fevers, cihlls, SOB, cough. No recent travel or sick contacts that she is aware of.   Past Medical/Surgical History: Past Medical History:  Diagnosis Date  . Anxiety   . Carpal tunnel syndrome, right   . Depression   . GERD (gastroesophageal reflux disease)   . Hypertension   . Shortness of breath dyspnea    with exertion uses inhaler    Past Surgical History:  Procedure Laterality Date  . CARPAL TUNNEL RELEASE Right 01/31/2016   Procedure: CARPAL TUNNEL RELEASE;  Surgeon: Cammy Copa, MD;  Location: [facility] OR;  Service: Orthopedics;  Laterality: Right;  . HAND TENDON SURGERY     right  . TRIGGER FINGER RELEASE Right 11/26/2017   Procedure: RELEASE TRIGGER FINGER RIGHT THUMB;  Surgeon: Cammy Copa, MD;  Location: [facility] OR;  Service: Orthopedics;  Laterality: Right;    Social History:  reports that she has been smoking cigarettes. She has never used smokeless tobacco. She reports current alcohol use. She reports current drug use. Frequency: 1.00 time per week. Drug: Marijuana.  Allergies: No Known Allergies  Family History:  Family History  Problem Relation Age of Onset  . Hypertension Father   . Hypertension Other      Current Outpatient Medications:  .  Aspirin-Salicylamide-Caffeine (BC HEADACHE POWDER PO), Take 2  packets by mouth daily as needed (general pain)., Disp: , Rfl:  .  cetirizine (ZYRTEC) 10 MG tablet, Take 1 tablet (10 mg total) by mouth daily., Disp: 90 tablet, Rfl: 2 .  chlorthalidone (HYGROTON) 25 MG tablet, Take 1 tablet (25 mg total) by mouth daily., Disp: 90 tablet, Rfl: 2 .  cyclobenzaprine (FLEXERIL) 10 MG tablet, Take 1 tablet (10 mg total) by mouth 3 (three) times daily as needed for muscle spasms., Disp: 30 tablet, Rfl: 0 .  gabapentin (NEURONTIN) 300 MG capsule, Take 1 capsule (300 mg total) by mouth 2 (two) times daily., Disp: 60 capsule, Rfl: 3 .  Ibuprofen-Famotidine 800-26.6 MG TABS, Take 1 tablet by mouth 2 (two) times daily., Disp: 60 tablet, Rfl: 0 .  traMADol (ULTRAM) 50 MG tablet, Take 1 tablet (50 mg total) by mouth every 6 (six) hours as needed., Disp: 15 tablet, Rfl: 0 .  zolpidem (AMBIEN) 5 MG tablet, Take 1 tablet (5 mg total) by mouth at bedtime as needed for sleep., Disp: 30 tablet, Rfl: 0  Review of Systems:  Constitutional: Denies fever, chills, diaphoresis, appetite change and fatigue.  HEENT: Denies photophobia, eye pain, redness, hearing loss, ear pain, mouth sores, trouble swallowing, neck pain, neck stiffness and tinnitus.   Respiratory: Denies SOB, DOE, cough, chest tightness,  and wheezing.   Cardiovascular: Denies chest pain, palpitations and leg swelling.  Gastrointestinal: Denies nausea, vomiting, abdominal pain, diarrhea, constipation, blood in stool and abdominal distention.  Genitourinary: Denies dysuria,  urgency, frequency, hematuria, flank pain and difficulty urinating.  Endocrine: Denies: hot or cold intolerance, sweats, changes in hair or nails, polyuria, polydipsia. Musculoskeletal: Denies myalgias, back pain, joint swelling, arthralgias and gait problem.  Skin: Denies pallor, rash and wound.  Neurological: Denies dizziness, seizures, syncope, weakness, light-headedness, numbness and headaches.  Hematological: Denies adenopathy. Easy bruising,  personal or family bleeding history  Psychiatric/Behavioral: Denies suicidal ideation, mood changes, confusion, nervousness, sleep disturbance and agitation    Physical Exam: Vitals:   08/21/18 1012  BP: 140/90  Pulse: 96  Temp: 98.2 F (36.8 C)  TempSrc: Oral  SpO2: 99%  Weight: 166 lb 3.2 oz (75.4 kg)    Body mass index is 29.91 kg/m.   Constitutional: NAD, calm, comfortable Eyes: PERRL, lids and conjunctivae normal ENMT: Mucous membranes are moist. Posterior pharynx erythematous without exudates or lesions.  Tympanic membrane is pearly white, no erythema or bulging. Neck: normal, supple, no masses, no thyromegaly Respiratory: clear to auscultation bilaterally, no wheezing, no crackles. Normal respiratory effort. No accessory muscle use.  Cardiovascular: Regular rate and rhythm, no murmurs / rubs / gallops. No extremity edema. 2+ pedal pulses. No carotid bruits.  Musculoskeletal: no clubbing / cyanosis. No joint deformity upper and lower extremities. Good ROM, no contractures. Normal muscle tone.  Skin: no rashes, lesions, ulcers. No induration Neurologic: Grossly intact and nonfocal Psychiatric: Normal judgment and insight. Alert and oriented x 3. Normal mood.    Impression and Plan:  Viral URI/allergies -Have advised her to get Zyrtec over-the-counter and take 1 tablet daily. -Have advised caution in taking over-the-counter decongestants due to her history of hypertension.  Essential hypertension -Not controlled, she has not been taking her chlorthalidone in over 2 months due to financial reasons. -Her insurance will kick in at the beginning of the new year but in the meantime she is willing to pay out-of-pocket for it. -Have refilled her chlorthalidone. -Have advised that she return to see Dr. Salomon FickBanks in 6 to 8 weeks for continued blood pressure management.    Patient Instructions  -It was nice seeing you today!  -Take Zyrtec 10 mg daily.  -Make sure you take  your blood pressure medication every day.  -Will schedule follow-up with Dr. Salomon FickBanks in 6 to 8 weeks for blood pressure check.     Kelly JanEstela Hernandez Acosta, MD [place] [facility]

## 2018-10-06 ENCOUNTER — Emergency Department (HOSPITAL_COMMUNITY): Payer: Self-pay

## 2018-10-06 ENCOUNTER — Emergency Department (HOSPITAL_COMMUNITY)
Admission: EM | Admit: 2018-10-06 | Discharge: 2018-10-06 | Disposition: A | Discharge status: Home / Self Care | Payer: Self-pay | Attending: Emergency Medicine | Admitting: Emergency Medicine

## 2018-10-06 ENCOUNTER — Other Ambulatory Visit: Payer: Self-pay

## 2018-10-06 ENCOUNTER — Encounter (HOSPITAL_COMMUNITY): Payer: Self-pay | Admitting: Emergency Medicine

## 2018-10-06 DIAGNOSIS — I10 Essential (primary) hypertension: Secondary | ICD-10-CM | POA: Insufficient documentation

## 2018-10-06 DIAGNOSIS — F17210 Nicotine dependence, cigarettes, uncomplicated: Secondary | ICD-10-CM | POA: Insufficient documentation

## 2018-10-06 DIAGNOSIS — Z79899 Other long term (current) drug therapy: Secondary | ICD-10-CM | POA: Insufficient documentation

## 2018-10-06 DIAGNOSIS — K529 Noninfective gastroenteritis and colitis, unspecified: Secondary | ICD-10-CM | POA: Insufficient documentation

## 2018-10-06 DIAGNOSIS — R1011 Right upper quadrant pain: Secondary | ICD-10-CM

## 2018-10-06 DIAGNOSIS — K769 Liver disease, unspecified: Secondary | ICD-10-CM | POA: Insufficient documentation

## 2018-10-06 LAB — COMPREHENSIVE METABOLIC PANEL
ALT: 20 U/L (ref 0–44)
AST: 18 U/L (ref 15–41)
Albumin: 4.2 g/dL (ref 3.5–5.0)
Alkaline Phosphatase: 46 U/L (ref 38–126)
Anion gap: 9 (ref 5–15)
BUN: 14 mg/dL (ref 6–20)
CO2: 23 mmol/L (ref 22–32)
Calcium: 9.5 mg/dL (ref 8.9–10.3)
Chloride: 103 mmol/L (ref 98–111)
Creatinine, Ser: 0.71 mg/dL (ref 0.44–1.00)
GFR calc Af Amer: >60 mL/min (ref >60)
GFR calc non Af Amer: >60 mL/min (ref >60)
Glucose, Bld: 99 mg/dL (ref 70–99)
Potassium: 3.9 mmol/L (ref 3.5–5.1)
Sodium: 135 mmol/L (ref 135–145)
Total Bilirubin: 1.1 mg/dL (ref 0.3–1.2)
Total Protein: 7.6 g/dL (ref 6.5–8.1)

## 2018-10-06 LAB — URINALYSIS, ROUTINE W REFLEX MICROSCOPIC
Bilirubin Urine: NEGATIVE
Glucose, UA: NEGATIVE mg/dL
Ketones, ur: NEGATIVE mg/dL
Leukocytes, UA: NEGATIVE
Nitrite: NEGATIVE
Protein, ur: NEGATIVE mg/dL
Specific Gravity, Urine: 1.02 (ref 1.005–1.030)
pH: 5 (ref 5.0–8.0)

## 2018-10-06 LAB — CBC WITH DIFFERENTIAL/PLATELET
Abs Immature Granulocytes: 0.05 10*3/uL (ref 0.00–0.07)
Basophils Absolute: 0 10*3/uL (ref 0.0–0.1)
Basophils Relative: 0 %
Eosinophils Absolute: 0 10*3/uL (ref 0.0–0.5)
Eosinophils Relative: 1 %
HCT: 41.8 % (ref 36.0–46.0)
Hemoglobin: 13.6 g/dL (ref 12.0–15.0)
Immature Granulocytes: 1 %
Lymphocytes Relative: 28 %
Lymphs Abs: 1.9 10*3/uL (ref 0.7–4.0)
MCH: 30.2 pg (ref 26.0–34.0)
MCHC: 32.5 g/dL (ref 30.0–36.0)
MCV: 92.7 fL (ref 80.0–100.0)
Monocytes Absolute: 0.6 10*3/uL (ref 0.1–1.0)
Monocytes Relative: 9 %
Neutro Abs: 4.2 10*3/uL (ref 1.7–7.7)
Neutrophils Relative %: 61 %
Platelets: 384 10*3/uL (ref 150–400)
RBC: 4.51 MIL/uL (ref 3.87–5.11)
RDW: 13 % (ref 11.5–15.5)
WBC: 6.7 10*3/uL (ref 4.0–10.5)
nRBC: 0 % (ref 0.0–0.2)

## 2018-10-06 LAB — POC URINE PREG, ED: Preg Test, Ur: NEGATIVE

## 2018-10-06 LAB — LIPASE, BLOOD: Lipase: 23 U/L (ref 11–51)

## 2018-10-06 MED ORDER — ONDANSETRON HCL 4 MG PO TABS: 4.0000 mg | ORAL_TABLET | Freq: Four times a day (QID) | ORAL | 0 refills | Status: DC

## 2018-10-06 MED ORDER — ACETAMINOPHEN 325 MG PO TABS
650.0000 mg | ORAL_TABLET | Freq: Once | ORAL | Status: AC
  Administered 2018-10-06: 650 mg via ORAL
  Filled 2018-10-06: qty 2

## 2018-10-06 NOTE — Discharge Instructions (Addendum)
Please read attached information. If you experience any new or worsening signs or symptoms please return to the emergency room for evaluation. Please follow-up with your primary care provider or specialist as discussed. Please use medication prescribed only as directed and discontinue taking if you have any concerning signs or symptoms.  Please follow-up with primary care for reevaluation of your liver lesion.

## 2018-10-06 NOTE — ED Provider Notes (Signed)
[facility] EM[facility]ENCY [facility] Provider Note   CSN: [id number] Arrival date & time: 10/06/18  [id number]   History   Chief Complaint Chief Complaint  Patient presents with  . Abdominal Pain  . Nausea  . Emesis  . Diarrhea    HPI Kelly Dineicole Michelle Patton is a 48 y.o. female.  HPI   48 year old female presents today with complaints of right upper quadrant abdominal pain.  Patient notes over the last week and a half she has had an ache in her right upper quadrant.  She notes that she has a squeezing sensation that comes and goes no severe nature.  She reports some associated nausea and vomiting, notes some loose stool.  She denies any fever, denies any lower abdominal pain, urinary changes.  She notes she did have some pork that she felt was undercooked around the same time, she denies any close sick contacts, no other abnormal exposures.    Past Medical History:  Diagnosis Date  . Anxiety   . Carpal tunnel syndrome, right   . Depression   . GERD (gastroesophageal reflux disease)   . Hypertension   . Shortness of breath dyspnea    with exertion uses inhaler    Patient Active Problem List   Diagnosis Date Noted  . Trigger thumb, right thumb 12/09/2017  . HTN (hypertension) 07/17/2017  . Primary insomnia 07/17/2017  . Thumb pain, right 04/08/2017    Past Surgical History:  Procedure Laterality Date  . CARPAL TUNNEL RELEASE Right 01/31/2016   Procedure: CARPAL TUNNEL RELEASE;  Surgeon: Cammy CopaScott Gregory Dean, MD;  [facility];  Service: Orthopedics;  Laterality: Right;  . HAND TENDON SURGERY     right  . TRIGGER FINGER RELEASE Right 11/26/2017   Procedure: RELEASE TRIGGER FINGER RIGHT THUMB;  Surgeon: Cammy Copaean, Gregory Scott, MD;  [facility];  Service: Orthopedics;  Laterality: Right;     OB History   No obstetric history on file.      Home Medications    Prior to Admission medications   Medication Sig Start Date End Date Taking? Authorizing  Provider  Aspirin-Salicylamide-Caffeine (BC HEADACHE POWDER PO) Take 2 packets by mouth daily as needed (general pain).    [provider]  cetirizine (ZYRTEC) 10 MG tablet Take 1 tablet (10 mg total) by mouth daily. 06/04/17   Deeann SaintBanks, Shannon R, MD  chlorthalidone (HYGROTON) 25 MG tablet Take 1 tablet (25 mg total) by mouth daily. 08/21/18   Philip AspenHernandez Acosta, Limmie PatriciaEstela Y, MD  cyclobenzaprine (FLEXERIL) 10 MG tablet Take 1 tablet (10 mg total) by mouth 3 (three) times daily as needed for muscle spasms. 05/16/18   Burchette, Elberta FortisBruce W, MD  gabapentin (NEURONTIN) 300 MG capsule Take 1 capsule (300 mg total) by mouth 2 (two) times daily. 06/04/18   Deeann SaintBanks, Shannon R, MD  Ibuprofen-Famotidine 800-26.6 MG TABS Take 1 tablet by mouth 2 (two) times daily. 12/09/17   Cammy Copaean, Gregory Scott, MD  ondansetron (ZOFRAN) 4 MG tablet Take 1 tablet (4 mg total) by mouth every 6 (six) hours. 10/06/18   Kailana Benninger, Tinnie GensJeffrey, PA-C  traMADol (ULTRAM) 50 MG tablet Take 1 tablet (50 mg total) by mouth every 6 (six) hours as needed. 07/10/18   Bethel BornGekas, Kelly Marie, PA-C  zolpidem (AMBIEN) 5 MG tablet Take 1 tablet (5 mg total) by mouth at bedtime as needed for sleep. 05/15/18   Deeann SaintBanks, Shannon R, MD    Family History Family History  Problem Relation Age of Onset  . Hypertension Father   .  Hypertension Other     Social History Social History   Tobacco Use  . Smoking status: Current Some Day Smoker    Types: Cigarettes  . Smokeless tobacco: Never Used  . Tobacco comment: Smokes 1 cigarette/week for 2 years  Substance Use Topics  . Alcohol use: Yes    Comment: once a week; fifth of liquor  . Drug use: Yes    Frequency: 1.0 times per week    Types: Marijuana    Comment: daily,last 11/25/17     Allergies   Patient has no known allergies.   Review of Systems Review of Systems  All other systems reviewed and are negative.    Physical Exam Updated Vital Signs BP 140/80 (BP Location: Right Arm)   Pulse 95   Temp  97.8 F (36.6 C) (Oral)   Resp 20   Ht 5\' 2"  (1.575 m)   Wt 73.9 kg   SpO2 100%   BMI 29.81 kg/m   Physical Exam Vitals signs and nursing note reviewed.  Constitutional:      Appearance: She is well-developed.  HENT:     Head: Normocephalic and atraumatic.  Eyes:     General: No scleral icterus.       Right eye: No discharge.        Left eye: No discharge.     Conjunctiva/sclera: Conjunctivae normal.     Pupils: Pupils are equal, round, and reactive to light.  Neck:     Musculoskeletal: Normal range of motion.     Vascular: No JVD.     Trachea: No tracheal deviation.  Pulmonary:     Effort: Pulmonary effort is normal.     Breath sounds: No stridor.  Abdominal:     Comments: Tenderness palpation right upper quadrant remainder abdomen soft nontender  Neurological:     Mental Status: She is alert and oriented to person, place, and time.     Coordination: Coordination normal.  Psychiatric:        Behavior: Behavior normal.        Thought Content: Thought content normal.        Judgment: Judgment normal.     ED Treatments / Results  Labs (all labs ordered are listed, but only abnormal results are displayed) Labs Reviewed  URINALYSIS, ROUTINE W REFLEX MICROSCOPIC - Abnormal; Notable for the following components:      Result Value   APPearance HAZY (*)    Hgb urine dipstick SMALL (*)    Bacteria, UA RARE (*)    All other components within normal limits  CBC WITH DIFFERENTIAL/PLATELET  COMPREHENSIVE METABOLIC PANEL  LIPASE, BLOOD  POC URINE PREG, ED    EKG None  Radiology US Abdomen Limited Ruq  Result Date: 10/06/2018 CLINICAL DATA:  Right quadrant pain for 1 week EXAM: ULTRASOUND ABDOMEN LIMITED RIGHT UPPER QUADRANT COMPARISON:  None. FINDINGS: Gallbladder: No gallstones or wall thickening visualized. No sonographic Murphy sign noted by sonographer. Common bile duct: Diameter: 3 5 mm Liver: Normal echogenicity.  No intrahepatic biliary dilatation. Left hepatic  lobe oval solid appearing hypoechoic lesion measures 1.9 x 1.2 x 2.2 cm. This remains indeterminate by ultrasound. Portal vein is patent on color Doppler imaging with normal direction of blood flow towards the liver. IMPRESSION: No acute finding by ultrasound. Negative for gallstones or biliary dilatation. 2.2 cm hypoechoic solid-appearing left hepatic lesion by ultrasound remains indeterminate. No available comparison studies to document stability. Recommend further evaluation with abdominal MRI without and with contrast. Electronically Signed  By: Osvaldo ShipperM.  Shick M.D.   On: 10/06/2018 10:57    Procedures Procedures (including critical care time)  Medications Ordered in ED Medications  acetaminophen (TYLENOL) tablet 650 mg (650 mg Oral Given 10/06/18 1309)     Initial Impression / Assessment and Plan / ED Course  I have reviewed the triage vital signs and the nursing notes.  Pertinent labs & imaging results that were available during my care of the patient were reviewed by me and considered in my medical decision making (see chart for details).     Labs:   Imaging:  Consults:  Therapeutics:  Discharge Meds:   Assessment/Plan: 48 year old female presents today with complaints of right upper quadrant abdominal pain.  She has very reassuring exam with no acute signs of obstruction, no elevation in white count, ultrasound reassuring. Pts symptoms most likely secondary to gastroenteritis.  I discussed with the patient the need for follow-up evaluation of the liver lesion with MRI.  Patient assured her follow-up evaluation of this.  Well-appearing tolerating p.o.  Patient is wanting to eat a sandwich while here.  Discharged with symptomatic care.  Patient is given strict return precautions, she verbalized understanding and agreement to today's plan had no further questions or concerns.     Final Clinical Impressions(s) / ED Diagnoses   Final diagnoses:  RUQ abdominal pain  Gastroenteritis    Hepatic lesion    ED Discharge Orders         Ordered    ondansetron (ZOFRAN) 4 MG tablet  Every 6 hours     10/06/18 1307           Eyvonne MechanicHedges, [place] Madding, PA-C 10/06/18 1350    Blane OharaZavitz, Joshua, MD 10/06/18 1626

## 2018-10-06 NOTE — ED Notes (Signed)
Patient was given a cup of water. 

## 2018-10-06 NOTE — ED Notes (Signed)
Pt given ginger ale.

## 2018-10-06 NOTE — ED Triage Notes (Signed)
Onset one week RUQ abdominal pain with intermittent nausea, emesis, and loose stool. Pain constant 4/10 achy sore and intermittent sharp pain 8/10.

## 2019-01-12 ENCOUNTER — Other Ambulatory Visit: Payer: Self-pay

## 2019-01-12 ENCOUNTER — Encounter: Payer: Self-pay | Admitting: Family Medicine

## 2019-01-12 ENCOUNTER — Ambulatory Visit (INDEPENDENT_AMBULATORY_CARE_PROVIDER_SITE_OTHER): Payer: Self-pay | Admitting: Family Medicine

## 2019-01-12 DIAGNOSIS — M5442 Lumbago with sciatica, left side: Secondary | ICD-10-CM

## 2019-01-12 MED ORDER — TIZANIDINE HCL 4 MG PO CAPS: 4.0000 mg | ORAL_CAPSULE | Freq: Three times a day (TID) | ORAL | 0 refills | Status: DC

## 2019-01-12 MED ORDER — PREDNISONE 10 MG PO TABS: ORAL_TABLET | ORAL | 0 refills | Status: DC

## 2019-01-12 MED ORDER — MELOXICAM 15 MG PO TABS: 15.0000 mg | ORAL_TABLET | Freq: Every day | ORAL | 0 refills | Status: DC

## 2019-01-12 NOTE — Progress Notes (Signed)
Virtual Visit via Video Note  I connected with Kelly Patton on 01/12/19 at  3:30 PM EDT by a video enabled telemedicine application and verified that I am speaking with the correct person using two identifiers.  Location patient: home Location provider:work or home office Persons participating in the virtual visit: patient, provider  I discussed the limitations of evaluation and management by telemedicine and the availability of in person appointments. The patient expressed understanding and agreed to proceed.   HPI: Pt with back pain x 3 days.  Progressively becoming worse.  Endorse increased pressure in the lower part of back with shooting pain in the back of L leg.  Took Aleeve. Couldn't get out of bed over the weekend.  Pt does not recall injury. Denies pushing, pulling, heavy lifting, loss of bowel or bladder.    Of note: pt state she is drinking and smoking less.    ROS: See pertinent positives and negatives per HPI.  Past Medical History:  Diagnosis Date  . Anxiety   . Carpal tunnel syndrome, right   . Depression   . GERD (gastroesophageal reflux disease)   . Hypertension   . Shortness of breath dyspnea    with exertion uses inhaler    Past Surgical History:  Procedure Laterality Date  . CARPAL TUNNEL RELEASE Right 01/31/2016   Procedure: CARPAL TUNNEL RELEASE;  Surgeon: Cammy CopaScott Gregory Dean, MD;  [facility];  Service: Orthopedics;  Laterality: Right;  . HAND TENDON SURGERY     right  . TRIGGER FINGER RELEASE Right 11/26/2017   Procedure: RELEASE TRIGGER FINGER RIGHT THUMB;  Surgeon: Cammy Copaean, Gregory Scott, MD;  [facility];  Service: Orthopedics;  Laterality: Right;    Family History  Problem Relation Age of Onset  . Hypertension Father   . Hypertension Other     SOCIAL HX:    Current Outpatient Medications:  .  Aspirin-Salicylamide-Caffeine (BC HEADACHE POWDER PO), Take 2 packets by mouth daily as needed (general pain)., Disp: , Rfl:  .  cetirizine  (ZYRTEC) 10 MG tablet, Take 1 tablet (10 mg total) by mouth daily., Disp: 90 tablet, Rfl: 2 .  chlorthalidone (HYGROTON) 25 MG tablet, Take 1 tablet (25 mg total) by mouth daily., Disp: 90 tablet, Rfl: 2 .  cyclobenzaprine (FLEXERIL) 10 MG tablet, Take 1 tablet (10 mg total) by mouth 3 (three) times daily as needed for muscle spasms., Disp: 30 tablet, Rfl: 0 .  gabapentin (NEURONTIN) 300 MG capsule, Take 1 capsule (300 mg total) by mouth 2 (two) times daily., Disp: 60 capsule, Rfl: 3 .  Ibuprofen-Famotidine 800-26.6 MG TABS, Take 1 tablet by mouth 2 (two) times daily., Disp: 60 tablet, Rfl: 0 .  ondansetron (ZOFRAN) 4 MG tablet, Take 1 tablet (4 mg total) by mouth every 6 (six) hours., Disp: 12 tablet, Rfl: 0 .  traMADol (ULTRAM) 50 MG tablet, Take 1 tablet (50 mg total) by mouth every 6 (six) hours as needed., Disp: 15 tablet, Rfl: 0 .  zolpidem (AMBIEN) 5 MG tablet, Take 1 tablet (5 mg total) by mouth at bedtime as needed for sleep., Disp: 30 tablet, Rfl: 0  EXAM:  VITALS per patient if applicable: RR between 12-20 bpm  GENERAL: alert, oriented, appears well and in no acute distress.  Pt in tears when walking around the house and trying to sit on the couch.  Notes increased pain with laughing.  HEENT: atraumatic, conjunctiva clear, no obvious abnormalities on inspection of external nose and ears  NECK: normal  movements of the head and neck  LUNGS: on inspection no signs of respiratory distress, breathing rate appears normal, no obvious Patton SOB, gasping or wheezing  CV: no obvious cyanosis  MS: moves all visible extremities without noticeable abnormality  PSYCH/NEURO: pleasant and cooperative, no obvious depression or anxiety, speech and thought processing grossly intact  ASSESSMENT AND PLAN:  Discussed the following assessment and plan:  Acute bilateral low back pain with left-sided sciatica  -likely 2/2 muscle strain -discussed NSAIDs, heat, massage, rest -consider imaging if  no improvement in symptoms after conservative management. - Plan: tiZANidine (ZANAFLEX) 4 MG capsule, meloxicam (MOBIC) 15 MG tablet, predniSONE (DELTASONE) 10 MG tablet -f/u prn for worsened or continued symptoms.    I discussed the assessment and treatment plan with the patient. The patient was provided an opportunity to ask questions and all were answered. The patient agreed with the plan and demonstrated an understanding of the instructions.   The patient was advised to call back or seek an in-person evaluation if the symptoms worsen or if the condition fails to improve as anticipated.   [name], MD

## 2019-03-01 ENCOUNTER — Emergency Department (HOSPITAL_COMMUNITY): Payer: Self-pay

## 2019-03-01 ENCOUNTER — Other Ambulatory Visit: Payer: Self-pay

## 2019-03-01 ENCOUNTER — Emergency Department (HOSPITAL_COMMUNITY)
Admission: EM | Admit: 2019-03-01 | Discharge: 2019-03-01 | Disposition: A | Discharge status: Home / Self Care | Payer: Self-pay | Attending: Emergency Medicine | Admitting: Emergency Medicine

## 2019-03-01 DIAGNOSIS — Y929 Unspecified place or not applicable: Secondary | ICD-10-CM | POA: Insufficient documentation

## 2019-03-01 DIAGNOSIS — S40022A Contusion of left upper arm, initial encounter: Secondary | ICD-10-CM | POA: Insufficient documentation

## 2019-03-01 DIAGNOSIS — Y939 Activity, unspecified: Secondary | ICD-10-CM | POA: Insufficient documentation

## 2019-03-01 DIAGNOSIS — I10 Essential (primary) hypertension: Secondary | ICD-10-CM | POA: Insufficient documentation

## 2019-03-01 DIAGNOSIS — F17210 Nicotine dependence, cigarettes, uncomplicated: Secondary | ICD-10-CM | POA: Insufficient documentation

## 2019-03-01 DIAGNOSIS — Y999 Unspecified external cause status: Secondary | ICD-10-CM | POA: Insufficient documentation

## 2019-03-01 DIAGNOSIS — H9201 Otalgia, right ear: Secondary | ICD-10-CM | POA: Insufficient documentation

## 2019-03-01 DIAGNOSIS — M25551 Pain in right hip: Secondary | ICD-10-CM | POA: Insufficient documentation

## 2019-03-01 DIAGNOSIS — S1081XA Abrasion of other specified part of neck, initial encounter: Secondary | ICD-10-CM | POA: Insufficient documentation

## 2019-03-01 DIAGNOSIS — Z79899 Other long term (current) drug therapy: Secondary | ICD-10-CM | POA: Insufficient documentation

## 2019-03-01 MED ORDER — HYDROCODONE-ACETAMINOPHEN 5-325 MG PO TABS
1.0000 | ORAL_TABLET | Freq: Once | ORAL | Status: AC
  Administered 2019-03-01: 1 via ORAL
  Filled 2019-03-01: qty 1

## 2019-03-01 MED ORDER — DIAZEPAM 2 MG PO TABS
2.0000 mg | ORAL_TABLET | Freq: Once | ORAL | Status: AC
  Administered 2019-03-01: 2 mg via ORAL
  Filled 2019-03-01: qty 1

## 2019-03-01 MED ORDER — METHOCARBAMOL 500 MG PO TABS: 500.0000 mg | ORAL_TABLET | Freq: Two times a day (BID) | ORAL | 0 refills | Status: AC
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
## 2019-03-01 NOTE — Discharge Instructions (Signed)
Your x-rays today were normal.  I have prescribed a short course of muscle relaxers to help with your pain, please take these as prescribed.  You may also apply heat, ice to the area to help with your symptoms.  Please follow-up with your primary care physician as needed.

## 2019-03-01 NOTE — ED Notes (Signed)
Patient transported to CT 
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
## 2019-03-01 NOTE — ED Notes (Signed)
Ambulated pt in hallway,  states feeling sore in hip. Pt has slow and steady gait.

## 2019-03-01 NOTE — ED Notes (Signed)
Patient verbalizes understanding of discharge instructions. Opportunity for questioning and answers were provided. Armband removed by staff, pt discharged from ED home via POV.  

## 2019-03-12 ENCOUNTER — Telehealth: Payer: Self-pay | Admitting: Family Medicine

## 2019-03-12 NOTE — Telephone Encounter (Signed)
Tried to call pt back no option of leaving a VM, state VM box is full.

## 2019-03-12 NOTE — Telephone Encounter (Signed)
Patient would like a copy of her June 2019 labs.

## 2019-03-19 NOTE — Telephone Encounter (Signed)
Attempted 2nd time to call pt no option to leave a VM

## 2019-03-27 NOTE — Telephone Encounter (Signed)
Not able to reach pt several times

## 2019-04-19 ENCOUNTER — Encounter (HOSPITAL_COMMUNITY): Payer: Self-pay | Admitting: Emergency Medicine

## 2019-04-19 ENCOUNTER — Other Ambulatory Visit: Payer: Self-pay

## 2019-04-19 ENCOUNTER — Emergency Department (HOSPITAL_COMMUNITY)
Admission: EM | Admit: 2019-04-19 | Discharge: 2019-04-19 | Disposition: A | Discharge status: Home / Self Care | Payer: Self-pay | Attending: Emergency Medicine | Admitting: Emergency Medicine

## 2019-04-19 DIAGNOSIS — M7061 Trochanteric bursitis, right hip: Secondary | ICD-10-CM | POA: Insufficient documentation

## 2019-04-19 DIAGNOSIS — M5442 Lumbago with sciatica, left side: Secondary | ICD-10-CM

## 2019-04-19 DIAGNOSIS — Z79899 Other long term (current) drug therapy: Secondary | ICD-10-CM | POA: Insufficient documentation

## 2019-04-19 DIAGNOSIS — M25551 Pain in right hip: Secondary | ICD-10-CM | POA: Insufficient documentation

## 2019-04-19 DIAGNOSIS — Y9389 Activity, other specified: Secondary | ICD-10-CM | POA: Insufficient documentation

## 2019-04-19 DIAGNOSIS — F17210 Nicotine dependence, cigarettes, uncomplicated: Secondary | ICD-10-CM | POA: Insufficient documentation

## 2019-04-19 DIAGNOSIS — I10 Essential (primary) hypertension: Secondary | ICD-10-CM | POA: Insufficient documentation

## 2019-04-19 MED ORDER — OXYCODONE-ACETAMINOPHEN 5-325 MG PO TABS
1.0000 | ORAL_TABLET | Freq: Once | ORAL | Status: AC
  Administered 2019-04-19: 1 via ORAL
  Filled 2019-04-19: qty 1

## 2019-04-19 MED ORDER — MELOXICAM 15 MG PO TABS: 15.0000 mg | ORAL_TABLET | Freq: Every day | ORAL | 0 refills | Status: DC

## 2019-04-19 MED ORDER — KETOROLAC TROMETHAMINE 60 MG/2ML IM SOLN
30.0000 mg | Freq: Once | INTRAMUSCULAR | Status: AC
  Administered 2019-04-19: 30 mg via INTRAMUSCULAR
  Filled 2019-04-19: qty 2

## 2019-04-19 MED ORDER — TIZANIDINE HCL 4 MG PO CAPS: 4.0000 mg | ORAL_CAPSULE | Freq: Three times a day (TID) | ORAL | 0 refills | Status: DC

## 2019-04-19 MED ORDER — PREDNISONE 10 MG PO TABS: 10.0000 mg | ORAL_TABLET | Freq: Every day | ORAL | 0 refills | Status: DC

## 2019-04-19 NOTE — ED Triage Notes (Signed)
Pt c/o right hip pain that started yesterday. Hx sciatica, took 2 Aleve with no change.

## 2019-04-19 NOTE — ED Notes (Signed)
Rt hip pain for one year no known injury

## 2019-04-19 NOTE — Discharge Instructions (Addendum)
Start Mobic for pain and inflammation Take Prednisone for the next 5 days for pain and inflammation Take Tizanidine (muscle relaxer) as needed for muscle pain Please avoid lying on the right side as much as possible Follow up with orthopedics

## 2019-04-19 NOTE — ED Provider Notes (Signed)
[facility] EMERGENCY DEPARTMENT Provider Note   CSN: [id number] Arrival date & time: 04/19/19  1450     History   Chief Complaint Chief Complaint  Patient presents with  . Hip Pain    HPI Kelly Patton is a 48 y.o. female who presents with R hip pain. PMH significant for anxiety/depression, GERD, HTN. For the past 2 days she has had pain in her R lower back and R buttocks/hip. The pain radiates down her R leg to her foot. Movement and walking make it worse. Resting makes it better. She states that she has had this problem on and off for about 1 year. She has been told it may be a problem with her sciatic nerve. She has had several xrays of her lower back which has not shown anything acute but did show degenerative disc disease. She has seen her PCP for this problem as well and tried muscle relaxers and a prednisone taper but once she finishes, the pain returns. She rates that pain as a 7/10 currently after taking 2 Aleve. No fever, syncope, trauma, unexplained weight loss, hx of cancer, loss of bowel/bladder function, saddle anesthesia, urinary retention, IVDU.   HPI  Past Medical History:  Diagnosis Date  . Anxiety   . Carpal tunnel syndrome, right   . Depression   . GERD (gastroesophageal reflux disease)   . Hypertension   . Shortness of breath dyspnea    with exertion uses inhaler    Patient Active Problem List   Diagnosis Date Noted  . Trigger thumb, right thumb 12/09/2017  . HTN (hypertension) 07/17/2017  . Primary insomnia 07/17/2017  . Thumb pain, right 04/08/2017    Past Surgical History:  Procedure Laterality Date  . CARPAL TUNNEL RELEASE Right 01/31/2016   Procedure: CARPAL TUNNEL RELEASE;  Surgeon: Meredith Pel, MD;  Location: [facility];  Service: Orthopedics;  Laterality: Right;  . HAND TENDON SURGERY     right  . TRIGGER FINGER RELEASE Right 11/26/2017   Procedure: RELEASE TRIGGER FINGER RIGHT THUMB;  Surgeon: Meredith Pel, MD;  Location: [facility];  Service: Orthopedics;  Laterality: Right;     OB History   No obstetric history on file.      Home Medications    Prior to Admission medications   Medication Sig Start Date End Date Taking? Authorizing Provider  Aspirin-Salicylamide-Caffeine (BC HEADACHE POWDER PO) Take 2 packets by mouth daily as needed (general pain).    [provider]  cetirizine (ZYRTEC) 10 MG tablet Take 1 tablet (10 mg total) by mouth daily. 06/04/17   Billie Ruddy, MD  chlorthalidone (HYGROTON) 25 MG tablet Take 1 tablet (25 mg total) by mouth daily. 08/21/18   Isaac Bliss, Rayford Halsted, MD  cyclobenzaprine (FLEXERIL) 10 MG tablet Take 1 tablet (10 mg total) by mouth 3 (three) times daily as needed for muscle spasms. 05/16/18   Burchette, Alinda Sierras, MD  gabapentin (NEURONTIN) 300 MG capsule Take 1 capsule (300 mg total) by mouth 2 (two) times daily. 06/04/18   Billie Ruddy, MD  Ibuprofen-Famotidine 800-26.6 MG TABS Take 1 tablet by mouth 2 (two) times daily. 12/09/17   Meredith Pel, MD  meloxicam (MOBIC) 15 MG tablet Take 1 tablet (15 mg total) by mouth daily. 01/12/19   Billie Ruddy, MD  ondansetron (ZOFRAN) 4 MG tablet Take 1 tablet (4 mg total) by mouth every 6 (six) hours. 10/06/18   Hedges, Dellis Filbert, PA-C  predniSONE (DELTASONE) 10  MG tablet Take 4 tabs every morning for 3 days, 3 tabs for 2 days, 2 tabs for 2 days, 1 tab for 1 day. 01/12/19   Deeann SaintBanks, Shannon R, MD  tiZANidine (ZANAFLEX) 4 MG capsule Take 1 capsule (4 mg total) by mouth 3 (three) times daily. 01/12/19   Deeann SaintBanks, Shannon R, MD  traMADol (ULTRAM) 50 MG tablet Take 1 tablet (50 mg total) by mouth every 6 (six) hours as needed. 07/10/18   Bethel BornGekas, Avrianna Smart Marie, PA-C  zolpidem (AMBIEN) 5 MG tablet Take 1 tablet (5 mg total) by mouth at bedtime as needed for sleep. 05/15/18   Deeann SaintBanks, Shannon R, MD    Family History Family History  Problem Relation Age of Onset  . Hypertension Father   . Hypertension Other      Social History Social History   Tobacco Use  . Smoking status: Current Some Day Smoker    Types: Cigarettes  . Smokeless tobacco: Never Used  . Tobacco comment: Smokes 1 cigarette/week for 2 years  Substance Use Topics  . Alcohol use: Yes    Comment: once a week; fifth of liquor  . Drug use: Yes    Frequency: 1.0 times per week    Types: Marijuana    Comment: daily,last 11/25/17     Allergies   Patient has no known allergies.   Review of Systems Review of Systems  Constitutional: Negative for fever.  Musculoskeletal: Positive for arthralgias and back pain. Negative for myalgias.  Neurological: Negative for weakness and numbness.     Physical Exam Updated Vital Signs BP (!) 121/100   Pulse (!) 112   Temp 98.8 F (37.1 C) (Oral)   Resp 16   SpO2 98%   Physical Exam Vitals signs and nursing note reviewed.  Constitutional:      General: She is not in acute distress.    Appearance: She is well-developed. She is obese. She is not ill-appearing.  HENT:     Head: Normocephalic and atraumatic.  Eyes:     General: No scleral icterus.       Right eye: No discharge.        Left eye: No discharge.     Conjunctiva/sclera: Conjunctivae normal.     Pupils: Pupils are equal, round, and reactive to light.  Neck:     Musculoskeletal: Normal range of motion.  Cardiovascular:     Rate and Rhythm: Normal rate.  Pulmonary:     Effort: Pulmonary effort is normal. No respiratory distress.  Abdominal:     General: There is no distension.  Musculoskeletal:     Comments: Back: Inspection: No masses, deformity, or rash Palpation: No midline spinal tenderness. No paraspinal muscle tenderness. There is tenderness over the R buttocks and R lateral hip Strength: 5/5 in lower extremities and normal plantar and dorsiflexion Sensation: Intact sensation with light touch in lower extremities bilaterally SLR: Negative supine straight leg raise Gait: not tested   Skin:    General: Skin  is warm and dry.  Neurological:     Mental Status: She is alert and oriented to person, place, and time.  Psychiatric:        Behavior: Behavior normal.      ED Treatments / Results  Labs (all labs ordered are listed, but only abnormal results are displayed) Labs Reviewed - No data to display  EKG None  Radiology No results found.  Procedures Procedures (including critical care time)  Medications Ordered in ED Medications  oxyCODONE-acetaminophen (PERCOCET/ROXICET) 5-325  MG per tablet 1 tablet (1 tablet Oral Given 04/19/19 1700)  ketorolac (TORADOL) injection 30 mg (30 mg Intramuscular Given 04/19/19 1658)     Initial Impression / Assessment and Plan / ED Course  I have reviewed the triage vital signs and the nursing notes.  Pertinent labs & imaging results that were available during my care of the patient were reviewed by me and considered in my medical decision making (see chart for details).  48 year old female presents with posterior and lateral R hip pain with radicular symptoms for 2 days. This is a chronic problem. She is hypertensive and initially tachycardic in triage which I believe is from pain. Her exam is consistent with more of a bursitis as she is focally tender over the lateral hip. She has no back tenderness to suggest a herniated disc. I suspect she may have a combination of sciatica from a hip issue and bursitis from overuse. She was given pain medicine here. She was give an rx for prednisione, mobic, zanaflex which has helped in the past. She was given a referral to ortho for possible steroid injection of the hip.  Final Clinical Impressions(s) / ED Diagnoses   Final diagnoses:  Right hip pain  Trochanteric bursitis of right hip    ED Discharge Orders    None       Bethel BornGekas, Kirsten Mckone Marie, PA-C 04/19/19 1749    Milagros Lollykstra, Richard S, MD 04/22/19 [phone]

## 2019-04-22 ENCOUNTER — Ambulatory Visit (INDEPENDENT_AMBULATORY_CARE_PROVIDER_SITE_OTHER): Payer: Self-pay | Admitting: Orthopaedic Surgery

## 2019-04-22 VITALS — Ht 62.5 in | Wt 162.0 lb

## 2019-04-22 DIAGNOSIS — M5416 Radiculopathy, lumbar region: Secondary | ICD-10-CM

## 2019-04-22 MED ORDER — PREDNISONE 10 MG (21) PO TBPK: ORAL_TABLET | ORAL | 0 refills | Status: DC

## 2019-04-22 MED ORDER — TRAMADOL HCL 50 MG PO TABS: 50.0000 mg | ORAL_TABLET | Freq: Two times a day (BID) | ORAL | 2 refills | Status: DC | PRN

## 2019-04-22 MED ORDER — MELOXICAM 7.5 MG PO TABS: 15.0000 mg | ORAL_TABLET | Freq: Every day | ORAL | 2 refills | Status: DC | PRN

## 2019-04-22 NOTE — Progress Notes (Signed)
Office Visit Note   Patient: Kelly Patton           Date of Birth: [birth year]           MRN: [id number] Visit Date: 04/22/2019              Requested by: Deeann SaintBanks, Shannon R, MD [address] [place],  [place] [id number] PCP: Deeann SaintBanks, Shannon R, MD   Assessment & Plan: Visit Diagnoses:  1. Lumbar radiculopathy     Plan: Impression is lumbar radiculopathy.  I have reviewed her previous x-rays of her hips and they are unremarkable.  Prescription sent in for meloxicam, tramadol, prednisone Dosepak.  If patient fails to receive any relief MRI would be the next step as this is been a recurring issue within the last year.  Follow-Up Instructions: Return if symptoms worsen or fail to improve.   Orders:  No orders of the defined types were placed in this encounter.  Meds ordered this encounter  Medications  . DISCONTD: predniSONE (STERAPRED UNI-PAK 21 TAB) 10 MG (21) TBPK tablet    Sig: Take as directed    Dispense:  21 tablet    Refill:  0  . meloxicam (MOBIC) 7.5 MG tablet    Sig: Take 2 tablets (15 mg total) by mouth daily as needed for pain.    Dispense:  14 tablet    Refill:  2  . traMADol (ULTRAM) 50 MG tablet    Sig: Take 1-2 tablets (50-100 mg total) by mouth 2 (two) times daily as needed.    Dispense:  30 tablet    Refill:  2  . predniSONE (STERAPRED UNI-PAK 21 TAB) 10 MG (21) TBPK tablet    Sig: Take as directed    Dispense:  21 tablet    Refill:  0      Procedures: No procedures performed   Clinical Data: No additional findings.   Subjective: Chief Complaint  Patient presents with  . Right Hip - Pain    Kelly Patton is a 48 year old female who comes in for low back and right hip pain.  She states that onset was 2 days ago but it has happened about 3 times in the last year.  She denies any injuries.  She states that she has pain that radiates from the low back and buttock area down the right leg.  Denies any bowel bladder dysfunction and denies any  constitutional symptoms.  She has had to be out of work in the past as a result.  Denies any groin pain.   Review of Systems  Constitutional: Negative.   HENT: Negative.   Eyes: Negative.   Respiratory: Negative.   Cardiovascular: Negative.   Endocrine: Negative.   Musculoskeletal: Negative.   Neurological: Negative.   Hematological: Negative.   Psychiatric/Behavioral: Negative.   All other systems reviewed and are negative.    Objective: Vital Signs: Ht 5' 2.5" (1.588 m)   Wt 162 lb (73.5 kg)   BMI 29.16 kg/m   Physical Exam Vitals signs and nursing note reviewed.  Constitutional:      Appearance: She is well-developed.  HENT:     Head: Normocephalic and atraumatic.  Neck:     Musculoskeletal: Neck supple.  Pulmonary:     Effort: Pulmonary effort is normal.  Abdominal:     Palpations: Abdomen is soft.  Skin:    General: Skin is warm.     Capillary Refill: Capillary refill takes less than 2 seconds.  Neurological:  Mental Status: She is alert and oriented to person, place, and time.  Psychiatric:        Behavior: Behavior normal.        Thought Content: Thought content normal.        Judgment: Judgment normal.     Ortho Exam Lumbar spine is tender to palpation as well as right SI joint.  Trochanteric bursa is nontender.  Negative logroll.  Negative sciatic tension sign.  Normal motor and sensory function normal reflexes. Specialty Comments:  No specialty comments available.  Imaging: No results found.   PMFS History: Patient Active Problem List   Diagnosis Date Noted  . Trigger thumb, right thumb 12/09/2017  . HTN (hypertension) 07/17/2017  . Primary insomnia 07/17/2017  . Thumb pain, right 04/08/2017   Past Medical History:  Diagnosis Date  . Anxiety   . Carpal tunnel syndrome, right   . Depression   . GERD (gastroesophageal reflux disease)   . Hypertension   . Shortness of breath dyspnea    with exertion uses inhaler    Family History   Problem Relation Age of Onset  . Hypertension Father   . Hypertension Other     Past Surgical History:  Procedure Laterality Date  . CARPAL TUNNEL RELEASE Right 01/31/2016   Procedure: CARPAL TUNNEL RELEASE;  Surgeon: Meredith Pel, MD;  Location: [facility];  Service: Orthopedics;  Laterality: Right;  . HAND TENDON SURGERY     right  . TRIGGER FINGER RELEASE Right 11/26/2017   Procedure: RELEASE TRIGGER FINGER RIGHT THUMB;  Surgeon: Meredith Pel, MD;  Location: [facility];  Service: Orthopedics;  Laterality: Right;   Social History   Occupational History  . Not on file  Tobacco Use  . Smoking status: Current Some Day Smoker    Types: Cigarettes  . Smokeless tobacco: Never Used  . Tobacco comment: Smokes 1 cigarette/week for 2 years  Substance and Sexual Activity  . Alcohol use: Yes    Comment: once a week; fifth of liquor  . Drug use: Yes    Frequency: 1.0 times per week    Types: Marijuana    Comment: daily,last 11/25/17  . Sexual activity: Yes    Birth control/protection: None

## 2019-05-06 ENCOUNTER — Encounter: Payer: Self-pay | Admitting: Family Medicine

## 2019-07-17 ENCOUNTER — Other Ambulatory Visit: Payer: Self-pay

## 2019-07-17 ENCOUNTER — Telehealth: Payer: Self-pay | Admitting: Family Medicine

## 2019-07-17 DIAGNOSIS — M545 Low back pain, unspecified: Secondary | ICD-10-CM

## 2019-07-17 MED ORDER — GABAPENTIN 300 MG PO CAPS: 300.0000 mg | ORAL_CAPSULE | Freq: Two times a day (BID) | ORAL | 3 refills | Status: DC

## 2019-07-17 NOTE — Telephone Encounter (Signed)
Patient needs a 3 mth supply called in for HCTZ to [facility] on [address].  She is almost out.   Please change her pharmacy in her chart to [facility] on [address] for all her prescriptions from now on.

## 2019-07-17 NOTE — Telephone Encounter (Signed)
Rx sent 

## 2019-07-20 ENCOUNTER — Telehealth (INDEPENDENT_AMBULATORY_CARE_PROVIDER_SITE_OTHER): Payer: Self-pay | Admitting: Family Medicine

## 2019-07-20 DIAGNOSIS — M79604 Pain in right leg: Secondary | ICD-10-CM

## 2019-07-20 DIAGNOSIS — M79605 Pain in left leg: Secondary | ICD-10-CM

## 2019-07-20 MED ORDER — MELOXICAM 15 MG PO TABS: 15.0000 mg | ORAL_TABLET | Freq: Every day | ORAL | 0 refills | Status: DC

## 2019-07-20 MED ORDER — TIZANIDINE HCL 4 MG PO CAPS: 4.0000 mg | ORAL_CAPSULE | Freq: Three times a day (TID) | ORAL | 0 refills | Status: DC | PRN
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
## 2019-07-22 ENCOUNTER — Other Ambulatory Visit: Payer: Self-pay

## 2019-07-22 ENCOUNTER — Other Ambulatory Visit: Payer: Self-pay | Admitting: Family Medicine

## 2019-07-22 DIAGNOSIS — I10 Essential (primary) hypertension: Secondary | ICD-10-CM

## 2019-07-22 MED ORDER — CHLORTHALIDONE 25 MG PO TABS: 25.0000 mg | ORAL_TABLET | Freq: Every day | ORAL | 0 refills | Status: DC

## 2019-07-22 NOTE — Telephone Encounter (Signed)
Medication Refill - Medication: chlorthalidone (HYGROTON) 25 MG tablet    Has the patient contacted their pharmacy? Yes.   (Agent: If no, request that the patient contact the pharmacy for the refill.) (Agent: If yes, when and what did the pharmacy advise?)  Preferred Pharmacy (with phone number or street name):  [facility] ([address]), [place] - [address]  [address] [place] [address]  Phone: [phone] Fax: [phone]  Not a 24 hour pharmacy; exact hours not known.     Agent: Please be advised that RX refills may take up to 3 business days. We ask that you follow-up with your pharmacy.

## 2019-07-29 ENCOUNTER — Other Ambulatory Visit: Payer: Self-pay

## 2019-07-29 ENCOUNTER — Ambulatory Visit (INDEPENDENT_AMBULATORY_CARE_PROVIDER_SITE_OTHER): Payer: Self-pay

## 2019-07-29 DIAGNOSIS — M79604 Pain in right leg: Secondary | ICD-10-CM

## 2019-07-29 DIAGNOSIS — M79605 Pain in left leg: Secondary | ICD-10-CM

## 2019-08-15 ENCOUNTER — Emergency Department (HOSPITAL_COMMUNITY)
Admission: EM | Admit: 2019-08-15 | Discharge: 2019-08-15 | Disposition: A | Discharge status: Home / Self Care | Payer: Self-pay | Attending: Emergency Medicine | Admitting: Emergency Medicine

## 2019-08-15 ENCOUNTER — Other Ambulatory Visit: Payer: Self-pay

## 2019-08-15 ENCOUNTER — Emergency Department (HOSPITAL_BASED_OUTPATIENT_CLINIC_OR_DEPARTMENT_OTHER): Payer: Self-pay

## 2019-08-15 ENCOUNTER — Emergency Department (HOSPITAL_COMMUNITY): Payer: Self-pay

## 2019-08-15 ENCOUNTER — Encounter (HOSPITAL_COMMUNITY): Payer: Self-pay | Admitting: Emergency Medicine

## 2019-08-15 DIAGNOSIS — B349 Viral infection, unspecified: Secondary | ICD-10-CM | POA: Insufficient documentation

## 2019-08-15 DIAGNOSIS — R519 Headache, unspecified: Secondary | ICD-10-CM | POA: Insufficient documentation

## 2019-08-15 DIAGNOSIS — M7918 Myalgia, other site: Secondary | ICD-10-CM | POA: Insufficient documentation

## 2019-08-15 DIAGNOSIS — R079 Chest pain, unspecified: Secondary | ICD-10-CM

## 2019-08-15 DIAGNOSIS — M79609 Pain in unspecified limb: Secondary | ICD-10-CM

## 2019-08-15 DIAGNOSIS — Z79899 Other long term (current) drug therapy: Secondary | ICD-10-CM | POA: Insufficient documentation

## 2019-08-15 DIAGNOSIS — R0602 Shortness of breath: Secondary | ICD-10-CM | POA: Insufficient documentation

## 2019-08-15 DIAGNOSIS — Z720 Tobacco use: Secondary | ICD-10-CM | POA: Insufficient documentation

## 2019-08-15 DIAGNOSIS — R0789 Other chest pain: Secondary | ICD-10-CM | POA: Insufficient documentation

## 2019-08-15 DIAGNOSIS — Z20828 Contact with and (suspected) exposure to other viral communicable diseases: Secondary | ICD-10-CM | POA: Insufficient documentation

## 2019-08-15 DIAGNOSIS — I10 Essential (primary) hypertension: Secondary | ICD-10-CM | POA: Insufficient documentation

## 2019-08-15 LAB — BASIC METABOLIC PANEL
Anion gap: 8 (ref 5–15)
BUN: 21 mg/dL — ABNORMAL HIGH (ref 6–20)
CO2: 26 mmol/L (ref 22–32)
Calcium: 10.1 mg/dL (ref 8.9–10.3)
Chloride: 100 mmol/L (ref 98–111)
Creatinine, Ser: 0.72 mg/dL (ref 0.44–1.00)
GFR calc Af Amer: >60 mL/min (ref >60)
GFR calc non Af Amer: >60 mL/min (ref >60)
Glucose, Bld: 98 mg/dL (ref 70–99)
Potassium: 3.7 mmol/L (ref 3.5–5.1)
Sodium: 134 mmol/L — ABNORMAL LOW (ref 135–145)

## 2019-08-15 LAB — I-STAT BETA HCG BLOOD, ED (MC, WL, AP ONLY)
I-stat hCG, quantitative: <5 m[IU]/mL (ref <5)
I-stat hCG, quantitative: <5 m[IU]/mL (ref <5)

## 2019-08-15 LAB — CBC
HCT: 38.4 % (ref 36.0–46.0)
Hemoglobin: 12.8 g/dL (ref 12.0–15.0)
MCH: 30.1 pg (ref 26.0–34.0)
MCHC: 33.3 g/dL (ref 30.0–36.0)
MCV: 90.4 fL (ref 80.0–100.0)
Platelets: 397 10*3/uL (ref 150–400)
RBC: 4.25 MIL/uL (ref 3.87–5.11)
RDW: 12.5 % (ref 11.5–15.5)
WBC: 5.4 10*3/uL (ref 4.0–10.5)
nRBC: 0 % (ref 0.0–0.2)

## 2019-08-15 LAB — POC SARS CORONAVIRUS 2 AG -  ED: SARS Coronavirus 2 Ag: NEGATIVE

## 2019-08-15 LAB — TROPONIN I (HIGH SENSITIVITY)
Troponin I (High Sensitivity): 5 ng/L (ref <18)
Troponin I (High Sensitivity): 5 ng/L (ref <18)

## 2019-08-15 LAB — D-DIMER, QUANTITATIVE: D-Dimer, Quant: 0.27 ug/mL-FEU (ref 0.00–0.50)

## 2019-08-15 MED ORDER — ACETAMINOPHEN 500 MG PO TABS
1000.0000 mg | ORAL_TABLET | Freq: Once | ORAL | Status: AC
  Administered 2019-08-15: 1000 mg via ORAL
  Filled 2019-08-15: qty 2

## 2019-08-15 MED ORDER — SODIUM CHLORIDE 0.9% FLUSH: 3.0000 mL | Freq: Once | INTRAVENOUS | Status: DC

## 2019-08-15 NOTE — ED Notes (Signed)
Pt has removed her bp cuff and her pulse ox  She reports that she has lt leg pain now  She wants to get a bag of chips from the waiting room  She was told that was not a very good idea.  She reports that she just wants to go home and get in her bed

## 2019-08-15 NOTE — ED Notes (Signed)
Patient transported to X-ray 

## 2019-08-15 NOTE — ED Provider Notes (Signed)
[facility] EMERGENCY DEPARTMENT Provider Note   CSN: [id number] Arrival date & time: 08/15/19  1416     History   Chief Complaint Chief Complaint  Patient presents with   Chest Pain   Shortness of Breath    HPI Kelly Patton is a 48 y.o. female.     HPI  Pt is a 48 year old female with PMH of HTN, GERD, anxiety, depression who presents to the ED with concern for body aches. Patient reports for the last 3 to 4 days she has been experiencing body aches.  Patient reports she has also felt she has a tight knot in her chest.  She states she has a chronic cough but no new change in her cough.  She also endorses subjective fever and chills/feeling clammy.  Patient states she has whole body aches including headache and bilateral leg pain. She has taken Dayquil at home with some relief of her symptoms.  She reports the last 2 months she has had episodes of feeling like she cannot catch her breath but this is not worse or new.  She has been able to ambulate without difficulty recently.  No known sick contacts.  Patient denies any abdominal pain, nausea, vomiting, diarrhea.  Of note, patient states she did use cocaine a few days ago but the symptoms started prior to this use.  No prior history of blood clots.  No leg swelling.   Past Medical History:  Diagnosis Date   Anxiety    Carpal tunnel syndrome, right    Depression    GERD (gastroesophageal reflux disease)    Hypertension    Shortness of breath dyspnea    with exertion uses inhaler    Patient Active Problem List   Diagnosis Date Noted   Trigger thumb, right thumb 12/09/2017   HTN (hypertension) 07/17/2017   Primary insomnia 07/17/2017   Thumb pain, right 04/08/2017    Past Surgical History:  Procedure Laterality Date   CARPAL TUNNEL RELEASE Right 01/31/2016   Procedure: CARPAL TUNNEL RELEASE;  Surgeon: Cammy Copa, MD;  Location: [facility] OR;  Service: Orthopedics;  Laterality:  Right;   HAND TENDON SURGERY     right   TRIGGER FINGER RELEASE Right 11/26/2017   Procedure: RELEASE TRIGGER FINGER RIGHT THUMB;  Surgeon: Cammy Copa, MD;  Location: [facility] OR;  Service: Orthopedics;  Laterality: Right;     OB History   No obstetric history on file.      Home Medications    Prior to Admission medications   Medication Sig Start Date End Date Taking? Authorizing Provider  chlorthalidone (HYGROTON) 25 MG tablet Take 1 tablet (25 mg total) by mouth daily. 07/22/19  Yes [name], MD  meloxicam (MOBIC) 15 MG tablet Take 1 tablet (15 mg total) by mouth daily. Patient taking differently: Take 15 mg by mouth daily as needed for pain.  07/20/19  Yes [name], MD  Naproxen Sod-diphenhydrAMINE (ALEVE PM PO) Take 1 tablet by mouth at bedtime as needed (sleep/pain).   Yes [provider]  naproxen sodium (ALEVE) 220 MG tablet Take 220-440 mg by mouth 2 (two) times daily as needed (pain).   Yes [provider]  OVER THE COUNTER MEDICATION Take 1 tablet by mouth at bedtime as needed (sleep). OTC sleep aid   Yes [provider]  cetirizine (ZYRTEC) 10 MG tablet Take 1 tablet (10 mg total) by mouth daily. Patient not taking: Reported on 08/15/2019 06/04/17  Billie Ruddy, MD  gabapentin (NEURONTIN) 300 MG capsule Take 1 capsule (300 mg total) by mouth 2 (two) times daily. Patient not taking: Reported on 08/15/2019 07/17/19   Billie Ruddy, MD  meloxicam (MOBIC) 15 MG tablet Take 1 tablet (15 mg total) by mouth daily. Patient not taking: Reported on 08/15/2019 04/19/19   Recardo Evangelist, PA-C  meloxicam (MOBIC) 7.5 MG tablet Take 2 tablets (15 mg total) by mouth daily as needed for pain. Patient not taking: Reported on 08/15/2019 04/22/19   Leandrew Koyanagi, MD  ondansetron (ZOFRAN) 4 MG tablet Take 1 tablet (4 mg total) by mouth every 6 (six) hours. Patient not taking: Reported on 08/15/2019 10/06/18   Hedges, Dellis Filbert, PA-C  tiZANidine  (ZANAFLEX) 4 MG capsule Take 1 capsule (4 mg total) by mouth 3 (three) times daily as needed for muscle spasms. Patient not taking: Reported on 08/15/2019 07/20/19   Billie Ruddy, MD  traMADol (ULTRAM) 50 MG tablet Take 1-2 tablets (50-100 mg total) by mouth 2 (two) times daily as needed. Patient not taking: Reported on 08/15/2019 04/22/19   Leandrew Koyanagi, MD  zolpidem (AMBIEN) 5 MG tablet Take 1 tablet (5 mg total) by mouth at bedtime as needed for sleep. Patient not taking: Reported on 08/15/2019 05/15/18   Billie Ruddy, MD    Family History Family History  Problem Relation Age of Onset   Hypertension Father    Hypertension Other     Social History Social History   Tobacco Use   Smoking status: Current Some Day Smoker    Types: Cigarettes   Smokeless tobacco: Never Used   Tobacco comment: Smokes 1 cigarette/week for 2 years  Substance Use Topics   Alcohol use: Yes    Comment: once a week; fifth of liquor   Drug use: Yes    Frequency: 1.0 times per week    Types: Marijuana    Comment: daily,last 11/25/17     Allergies   Patient has no known allergies.   Review of Systems Review of Systems  Constitutional: Positive for chills and fever (Subjective).  HENT: Negative for ear pain and sore throat.   Eyes: Negative for pain and visual disturbance.  Respiratory: Positive for cough, chest tightness and shortness of breath.   Cardiovascular: Negative for palpitations and leg swelling.  Gastrointestinal: Negative for abdominal pain, diarrhea, nausea and vomiting.  Genitourinary: Negative for dysuria and hematuria.  Musculoskeletal: Positive for myalgias. Negative for arthralgias and back pain.  Skin: Negative for color change and rash.  Neurological: Positive for headaches. Negative for seizures and syncope.  Psychiatric/Behavioral: Negative for agitation and behavioral problems.  All other systems reviewed and are negative.    Physical Exam Updated Vital  Signs BP 122/85    Pulse 98    Temp 97.6 F (36.4 C)    Resp 19    SpO2 98%   Physical Exam Vitals signs and nursing note reviewed.  Constitutional:      General: She is not in acute distress.    Appearance: She is well-developed.  HENT:     Head: Normocephalic and atraumatic.  Eyes:     Extraocular Movements: Extraocular movements intact.     Conjunctiva/sclera: Conjunctivae normal.     Pupils: Pupils are equal, round, and reactive to light.  Neck:     Musculoskeletal: Normal range of motion and neck supple. No neck rigidity.  Cardiovascular:     Rate and Rhythm: Regular rhythm. Tachycardia present.  Heart sounds: No murmur.  Pulmonary:     Effort: Pulmonary effort is normal. No respiratory distress.     Breath sounds: Normal breath sounds.  Abdominal:     Palpations: Abdomen is soft.     Tenderness: There is no abdominal tenderness.  Musculoskeletal:        General: Tenderness (Mild TTP of L calf) present.     Right lower leg: No edema.     Left lower leg: No edema.  Skin:    General: Skin is warm and dry.  Neurological:     General: No focal deficit present.     Mental Status: She is alert. Mental status is at baseline.     Cranial Nerves: No cranial nerve deficit.     Sensory: No sensory deficit.     Motor: No weakness.     Gait: Gait normal.  Psychiatric:        Mood and Affect: Mood normal.        Behavior: Behavior normal.      ED Treatments / Results  Labs (all labs ordered are listed, but only abnormal results are displayed) Labs Reviewed  BASIC METABOLIC PANEL - Abnormal; Notable for the following components:      Result Value   Sodium 134 (*)    BUN 21 (*)    All other components within normal limits  CBC  D-DIMER, QUANTITATIVE (NOT [facility])  I-STAT BETA HCG BLOOD, ED ([facility], [facility], [facility] ONLY)  I-STAT BETA HCG BLOOD, ED ([facility], [facility], [facility] ONLY)  POC SARS CORONAVIRUS 2 AG -  ED  TROPONIN I (HIGH SENSITIVITY)  TROPONIN I (HIGH SENSITIVITY)    EKG EKG  Interpretation  Date/Time:  Saturday August 15 2019 14:29:30 EST Ventricular Rate:  109 PR Interval:  154 QRS Duration: 70 QT Interval:  346 QTC Calculation: 465 R Axis:   -2 Text Interpretation: Sinus tachycardia Artifact Abnormal ECG Confirmed by Blane OharaZavitz, Joshua [phone]([id number]) on 08/15/2019 3:57:53 PM   Radiology Dg Chest 2 View  Result Date: 08/15/2019 CLINICAL DATA:  Pt reports chest tightness, trouble catching her breath at night while lying down and feeling "clammy" at times. Pt denies any recent sick contacts, fevers. Hx HTN EXAM: CHEST - 2 VIEW COMPARISON:  03/01/2019 FINDINGS: Low lung volumes with some crowding of bronchovascular structures at the lung bases. Linear opacities at the left lung base consistent with subsegmental atelectasis versus patchy infiltrate, slightly increased. Heart size upper limits normal. No effusion. No pneumothorax. Visualized bones unremarkable. IMPRESSION: 1. Low lung volumes with some crowding of bronchovascular structures at the lung bases. 2. Linear subsegmental atelectasis versus infiltrate, lateral left lung base. Electronically Signed   By: Corlis Leak  Hassell M.D.   On: 08/15/2019 15:09   Vas [place]as Lower Extremity Venous (dvt) (only [facility])  Result Date: 08/15/2019  Lower Venous Study Indications: Pain.  Comparison Study: No prior study on file for comparison Performing Technologist: Sherren Kernsandace Kanady RVS  Examination Guidelines: A complete evaluation includes B-mode imaging, spectral Doppler, color Doppler, and power Doppler as needed of all accessible portions of each vessel. Bilateral testing is considered an integral part of a complete examination. Limited examinations for reoccurring indications may be performed as noted.  +-----+---------------+---------+-----------+----------+--------------+  RIGHT Compressibility Phasicity Spontaneity Properties Thrombus Aging  +-----+---------------+---------+-----------+----------+--------------+  CFV   Full            Yes        Yes                                    +-----+---------------+---------+-----------+----------+--------------+   +---------+---------------+---------+-----------+----------+--------------+  LEFT      Compressibility Phasicity Spontaneity Properties Thrombus Aging  +---------+---------------+---------+-----------+----------+--------------+  CFV       Full            Yes       Yes                                    +---------+---------------+---------+-----------+----------+--------------+  SFJ       Full                                                             +---------+---------------+---------+-----------+----------+--------------+  FV Prox   Full                                                             +---------+---------------+---------+-----------+----------+--------------+  FV Mid    Full                                                             +---------+---------------+---------+-----------+----------+--------------+  FV Distal Full                                                             +---------+---------------+---------+-----------+----------+--------------+  PFV       Full                                                             +---------+---------------+---------+-----------+----------+--------------+  POP       Full            Yes       Yes                                    +---------+---------------+---------+-----------+----------+--------------+  PTV       Full                                                             +---------+---------------+---------+-----------+----------+--------------+  PERO      Full                                                             +---------+---------------+---------+-----------+----------+--------------+  Summary: Right: No evidence of common femoral vein obstruction. Left: There is no evidence of deep vein thrombosis in the lower extremity.  *See table(s) above for measurements and observations. Electronically signed by Waverly Ferrari MD on 08/15/2019 at 11:08:58 PM.    Final     Procedures Procedures (including critical care time)  Medications Ordered in ED Medications  acetaminophen (TYLENOL) tablet 1,000 mg (1,000 mg Oral Given 08/15/19 1600)     Initial Impression / Assessment and Plan / ED Course  I have reviewed the triage vital signs and the nursing notes.  Pertinent labs & imaging results that were available during my care of the patient were reviewed by me and considered in my medical decision making (see chart for details).  Kelly Patton was evaluated in Emergency Department on 08/16/2019 for the symptoms described in the history of present illness. She was evaluated in the context of the global COVID-19 pandemic, which necessitated consideration that the patient might be at risk for infection with the SARS-CoV-2 virus that causes COVID-19. Institutional protocols and algorithms that pertain to the evaluation of patients at risk for COVID-19 are in a state of rapid change based on information released by regulatory bodies including the CDC and federal and state organizations. These policies and algorithms were followed during the patient's care in the ED.     On arrival, pt is afebrile, tachycardic.  Considered: Viral URI, influenza, pneumonia, COVID-19, ACS, PE  EKG shows sinus tachycardia with no obvious ischemic findings Chest x-ray: Questionable left lateral lung base infiltrate versus atelectasis Endorses mild HA. No gross neuro deficits or neck rigidity to suggest meningitis/encephalitis  No current fever, no leukocytosis; patient does not have any new change in chronic cough or subacute shortness of breath.  Clinical suspicion for pneumonia. Score 3, low risk Troponin negative x2; doubt ACS at this time  Covid rapid screen negative   Patient given Tylenol.  Upon reassessment, endorses worsening L calf pain. States both legs hurt but her L feels like a "charley horse"; no  appreciated swelling. Given recurrent tachycardia, will obtain DVT study of LLE and ddimer  DVT study and ddimer negative. Low suspicion for PE/DVT given above work up.   Overall, concern for viral URI leading to body aches, subjective fever/chills, cough and intermittent shortness of breath. Pt ambulatory without difficulty. Discussed symptomatic management at home. Pt to follow with PCP. Strict return precautions given.   Final Clinical Impressions(s) / ED Diagnoses   Final diagnoses:  Chest pain, unspecified type  Viral infection    ED Discharge Orders    None       Norton Pastel, MD 08/16/19 [id number]    Blane Ohara, MD 08/17/19 [phone]

## 2019-08-15 NOTE — ED Notes (Signed)
Patient verbalizes understanding of discharge instructions. Opportunity for questioning and answers were provided. Armband removed by staff, pt discharged from ED ambulatory to home.  

## 2019-08-15 NOTE — Progress Notes (Signed)
VASCULAR LAB PRELIMINARY  PRELIMINARY  PRELIMINARY  PRELIMINARY  Left lower extremity venous duplex completed.    Preliminary report:  See CV proc for preliminary results.   Daylene Posey, MD results  Sharion Dove, RVT 08/15/2019, 6:55 PM

## 2019-08-15 NOTE — ED Notes (Signed)
The pt has intermittent chest tightness especially at night for several days sl discomfort at present no sob  Talks non-stop

## 2019-08-15 NOTE — ED Triage Notes (Signed)
Pt reports chest tightness, trouble catching her breath at night while lying down and feeling "clammy" at times. Pt denies any recent sick contacts, fevers. A/ox4, resp e/u, nad.

## 2019-08-15 NOTE — ED Notes (Signed)
airboune and contact removed by accident

## 2019-09-02 ENCOUNTER — Encounter: Payer: Self-pay | Admitting: Family Medicine

## 2019-09-16 ENCOUNTER — Ambulatory Visit: Payer: Self-pay | Admitting: *Deleted

## 2019-09-16 NOTE — Telephone Encounter (Signed)
Patient calls with bilateral foot edema mild she reports. Not able to see but she feels it with her shoes on. Patient has been without the medication [place] 25 mg tabs for about 2 weeks due to finances. The edema is on the top of her feet. Denies CP/SOB/Calf pain. B/p yesterday she reported was normal when checked. Reported her diet is high in salt with pork and drinking alcohol and using marijuana. She intends to get the blood pressure medication tomorrow. Care Advice including decrease salt and sodium intake including the pork meat,  junk food.Increase daily water intake. Limit alcohol use. Discussed harmful affects the alcohol and drug use has on the body and our organs. Reviewed urgent symptoms requiring immediate evaluation.   Reason for Disposition . [1] MILD swelling of both ankles (i.e., pedal edema) AND [2] is a chronic symptom (recurrent or ongoing AND present > 4 weeks)  Answer Assessment - Initial Assessment Questions 1. LOCATION: "Which joint is swollen?"     Both feet are swollen. 2. ONSET: "When did the swelling start?"     About one week ago 3. SIZE: "How large is the swelling?"     Shoes feel too tight 4. PAIN: "Is there any pain?" If so, ask: "How bad is it?" (Scale 1-10; or mild, moderate, severe)     Yes, legs and feet with pain, twinge-like pain in both legs and feet.Was given Meloxicam 2 weeks ago. 5. CAUSE: "What do you think caused the swollen joint?"    Unsure, maybe the alcohol she drinks and eating pork.  6. OTHER SYMPTOMS: "Do you have any other symptoms?" (e.g., fever, chest pain, difficulty breathing, calf pain)     no 7. PREGNANCY: "Is there any chance you are pregnant?" "When was your last menstrual period?"     no  Protocols used: LEG SWELLING AND EDEMA-A-AH, ANKLE SWELLING-A-AH

## 2019-10-07 ENCOUNTER — Encounter: Payer: Self-pay | Admitting: Family Medicine

## 2019-11-09 ENCOUNTER — Telehealth: Payer: Self-pay | Admitting: Family Medicine

## 2019-11-09 ENCOUNTER — Other Ambulatory Visit: Payer: Self-pay

## 2019-11-09 DIAGNOSIS — I10 Essential (primary) hypertension: Secondary | ICD-10-CM

## 2019-11-09 MED ORDER — CHLORTHALIDONE 25 MG PO TABS: 25.0000 mg | ORAL_TABLET | Freq: Every day | ORAL | 0 refills | Status: DC

## 2019-11-09 NOTE — Telephone Encounter (Signed)
Pt BP medication refill sent to her pharmacy, pt has appointment for med refill on the rest of her medications on 01/06/20 at 8 am

## 2019-11-09 NOTE — Telephone Encounter (Signed)
Medication Refill: Chlorthalidone  Tizanidine Gabapentin (pt would like to up her dosage if possible)  Pharmacy: [facility] [address] [place]: [phone]

## 2019-11-10 NOTE — Telephone Encounter (Signed)
Pt scheduled for virtual visit on 11/11/2019 for med refill

## 2019-11-11 ENCOUNTER — Telehealth (INDEPENDENT_AMBULATORY_CARE_PROVIDER_SITE_OTHER): Payer: Self-pay | Admitting: Family Medicine

## 2019-11-11 DIAGNOSIS — M545 Low back pain, unspecified: Secondary | ICD-10-CM | POA: Insufficient documentation

## 2019-11-11 DIAGNOSIS — M79604 Pain in right leg: Secondary | ICD-10-CM

## 2019-11-11 DIAGNOSIS — M79605 Pain in left leg: Secondary | ICD-10-CM

## 2019-11-11 MED ORDER — GABAPENTIN 300 MG PO CAPS: 300.0000 mg | ORAL_CAPSULE | Freq: Two times a day (BID) | ORAL | 3 refills | Status: DC

## 2019-11-11 MED ORDER — TIZANIDINE HCL 4 MG PO CAPS: 4.0000 mg | ORAL_CAPSULE | Freq: Three times a day (TID) | ORAL | 1 refills | Status: DC | PRN

## 2019-11-11 NOTE — Progress Notes (Signed)
Virtual Visit via Telephone Note  I connected with Kelly Patton on 11/11/19 at  3:30 PM EST by telephone and verified that I am speaking with the correct person using two identifiers.   I discussed the limitations, risks, security and privacy concerns of performing an evaluation and management service by telephone and the availability of in person appointments. I also discussed with the patient that there may be a patient responsible charge related to this service. The patient expressed understanding and agreed to proceed.  Location patient: home Location provider: work or home office Participants present for the call: patient, provider Patient did not have a visit in the prior 7 days to address this/these issue(s).   History of Present Illness: Pt still having mild back pain.  Also with sharp pain and spasms in legs and arms x 1 month.  Requesting refill on zanflex and gabapentin.   Pt inquires about menopause.  States having hot flashes/sweats at night.  Did not have menses last month.  Also notes epigastric fullness.    Pt states she started a new job since last visit.  Mammogram scheduled.  CPE next month.   Observations/Objective: Patient sounds cheerful and well on the phone. I do not appreciate any SOB. Speech and thought processing are grossly intact. Patient reported vitals:  Assessment and Plan: Lumbar back pain  -supportive care.  Stretching -given precautions -Plan: gabapentin (NEURONTIN) 300 MG capsule  Pain in both lower extremities  -discussed causes of muscle cramping/spasms -will obtain labs at upcoming [facility] - Plan: tiZANidine (ZANAFLEX) 4 MG capsule   Follow Up Instructions: F/u in 1 month  I did not refer this patient for an OV in the next 24 hours for this/these issue(s).  I discussed the assessment and treatment plan with the patient. The patient was provided an opportunity to ask questions and all were answered. The patient agreed with the  plan and demonstrated an understanding of the instructions.   The patient was advised to call back or seek an in-person evaluation if the symptoms worsen or if the condition fails to improve as anticipated.  I provided 11 minutes of non-face-to-face time during this encounter.   [name], MD

## 2019-11-16 ENCOUNTER — Other Ambulatory Visit: Payer: Self-pay | Admitting: Internal Medicine

## 2019-11-16 DIAGNOSIS — I10 Essential (primary) hypertension: Secondary | ICD-10-CM

## 2019-12-31 ENCOUNTER — Emergency Department (HOSPITAL_COMMUNITY)
Admission: EM | Admit: 2019-12-31 | Discharge: 2019-12-31 | Disposition: A | Discharge status: Home / Self Care | Payer: PRIVATE HEALTH INSURANCE | Attending: Emergency Medicine | Admitting: Emergency Medicine

## 2019-12-31 ENCOUNTER — Encounter (HOSPITAL_COMMUNITY): Payer: Self-pay

## 2019-12-31 ENCOUNTER — Emergency Department (HOSPITAL_COMMUNITY): Payer: PRIVATE HEALTH INSURANCE

## 2019-12-31 ENCOUNTER — Other Ambulatory Visit: Payer: Self-pay

## 2019-12-31 DIAGNOSIS — R079 Chest pain, unspecified: Secondary | ICD-10-CM | POA: Insufficient documentation

## 2019-12-31 DIAGNOSIS — R05 Cough: Secondary | ICD-10-CM

## 2019-12-31 DIAGNOSIS — R519 Headache, unspecified: Secondary | ICD-10-CM | POA: Insufficient documentation

## 2019-12-31 DIAGNOSIS — J069 Acute upper respiratory infection, unspecified: Secondary | ICD-10-CM | POA: Insufficient documentation

## 2019-12-31 DIAGNOSIS — R059 Cough, unspecified: Secondary | ICD-10-CM

## 2019-12-31 DIAGNOSIS — I10 Essential (primary) hypertension: Secondary | ICD-10-CM | POA: Insufficient documentation

## 2019-12-31 DIAGNOSIS — F17210 Nicotine dependence, cigarettes, uncomplicated: Secondary | ICD-10-CM | POA: Insufficient documentation

## 2019-12-31 DIAGNOSIS — Z79899 Other long term (current) drug therapy: Secondary | ICD-10-CM | POA: Insufficient documentation

## 2019-12-31 LAB — CBC WITH DIFFERENTIAL/PLATELET
Abs Immature Granulocytes: 0.11 10*3/uL — ABNORMAL HIGH (ref 0.00–0.07)
Basophils Absolute: 0 10*3/uL (ref 0.0–0.1)
Basophils Relative: 0 %
Eosinophils Absolute: 0.1 10*3/uL (ref 0.0–0.5)
Eosinophils Relative: 1 %
HCT: 38.4 % (ref 36.0–46.0)
Hemoglobin: 12.6 g/dL (ref 12.0–15.0)
Immature Granulocytes: 1 %
Lymphocytes Relative: 22 %
Lymphs Abs: 2.1 10*3/uL (ref 0.7–4.0)
MCH: 30.1 pg (ref 26.0–34.0)
MCHC: 32.8 g/dL (ref 30.0–36.0)
MCV: 91.9 fL (ref 80.0–100.0)
Monocytes Absolute: 0.8 10*3/uL (ref 0.1–1.0)
Monocytes Relative: 9 %
Neutro Abs: 6.4 10*3/uL (ref 1.7–7.7)
Neutrophils Relative %: 67 %
Platelets: 358 10*3/uL (ref 150–400)
RBC: 4.18 MIL/uL (ref 3.87–5.11)
RDW: 12.3 % (ref 11.5–15.5)
WBC: 9.5 10*3/uL (ref 4.0–10.5)
nRBC: 0 % (ref 0.0–0.2)

## 2019-12-31 LAB — BASIC METABOLIC PANEL
Anion gap: 9 (ref 5–15)
BUN: 12 mg/dL (ref 6–20)
CO2: 29 mmol/L (ref 22–32)
Calcium: 10 mg/dL (ref 8.9–10.3)
Chloride: 100 mmol/L (ref 98–111)
Creatinine, Ser: 0.59 mg/dL (ref 0.44–1.00)
GFR calc Af Amer: >60 mL/min (ref >60)
GFR calc non Af Amer: >60 mL/min (ref >60)
Glucose, Bld: 97 mg/dL (ref 70–99)
Potassium: 3.7 mmol/L (ref 3.5–5.1)
Sodium: 138 mmol/L (ref 135–145)

## 2019-12-31 LAB — I-STAT BETA HCG BLOOD, ED (MC, WL, AP ONLY): I-stat hCG, quantitative: <5 m[IU]/mL (ref <5)

## 2019-12-31 LAB — TROPONIN I (HIGH SENSITIVITY): Troponin I (High Sensitivity): 8 ng/L (ref <18)

## 2019-12-31 MED ORDER — PROCHLORPERAZINE EDISYLATE 10 MG/2ML IJ SOLN
10.0000 mg | Freq: Once | INTRAMUSCULAR | Status: AC
  Administered 2019-12-31: 10 mg via INTRAVENOUS

## 2019-12-31 MED ORDER — BENZONATATE 100 MG PO CAPS: 100.0000 mg | ORAL_CAPSULE | Freq: Three times a day (TID) | ORAL | 0 refills | Status: DC

## 2019-12-31 MED ORDER — DIPHENHYDRAMINE HCL 50 MG/ML IJ SOLN
12.5000 mg | Freq: Once | INTRAMUSCULAR | Status: AC
  Administered 2019-12-31: 12.5 mg via INTRAVENOUS

## 2019-12-31 MED ORDER — PROCHLORPERAZINE EDISYLATE 10 MG/2ML IJ SOLN
10.0000 mg | Freq: Once | INTRAMUSCULAR | Status: DC
  Administered 2019-12-31: 16:00:00 10 mg via INTRAMUSCULAR
  Filled 2019-12-31: qty 2

## 2019-12-31 MED ORDER — KETOROLAC TROMETHAMINE 15 MG/ML IJ SOLN
15.0000 mg | Freq: Once | INTRAMUSCULAR | Status: DC
  Filled 2019-12-31: qty 1

## 2019-12-31 MED ORDER — DIPHENHYDRAMINE HCL 50 MG/ML IJ SOLN
12.5000 mg | Freq: Once | INTRAMUSCULAR | Status: DC
  Administered 2019-12-31: 12.5 mg via INTRAMUSCULAR
  Filled 2019-12-31: qty 1

## 2019-12-31 MED ORDER — ONDANSETRON 4 MG PO TBDP: 4.0000 mg | ORAL_TABLET | Freq: Three times a day (TID) | ORAL | 0 refills | Status: DC | PRN

## 2019-12-31 NOTE — ED Triage Notes (Signed)
Pt arrives to ED w/ cough and headache. Pt states she thinks she has "the flu". Pt eating popeyes in triage. Pt states she gets covid test every week and has been negative everytime.

## 2019-12-31 NOTE — ED Provider Notes (Signed)
[facility] EMERGENCY DEPARTMENT Provider Note   CSN: [id number] Arrival date & time: 12/31/19  1225     History Chief Complaint  Patient presents with  . Cough    Kelly Patton is a 49 y.o. female with a past medical history significant for anxiety, depression, GERD, and hypertension who presents to the ED for evaluation of productive cough, headache, and body aches x3 days.  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  Admits to subjective fevers.  She also admits to central, nonradiating, episodic chest pain.  Last episode of chest pain was yesterday evening.  Denies association with exertion.  Denies associated shortness of breath, abdominal pain, nausea, vomiting, and diarrhea.  Patient notes headache is located in the bilateral frontal region.  Denies worst headache of her life and worse intensity at onset.  Denies associated photophobia, visual disturbances, and nausea.  Patient denies history of blood clots, lower extremity edema, recent long immobilizations, recent surgeries, and hormonal treatments.  History obtained from patient and past medical records. No interpreter used during encounter.      Past Medical History:  Diagnosis Date  . Anxiety   . Carpal tunnel syndrome, right   . Depression   . GERD (gastroesophageal reflux disease)   . Hypertension   . Shortness of breath dyspnea    with exertion uses inhaler    Patient Active Problem List   Diagnosis Date Noted  . Lumbar back pain 11/11/2019  . Trigger thumb, right thumb 12/09/2017  . HTN (hypertension) 07/17/2017  . Primary insomnia 07/17/2017  . Thumb pain, right 04/08/2017    Past Surgical History:  Procedure Laterality Date  . CARPAL TUNNEL RELEASE Right 01/31/2016   Procedure: CARPAL TUNNEL RELEASE;  Surgeon: Cammy Copa, MD;  Location: [facility] OR;  Service: Orthopedics;  Laterality: Right;    . HAND TENDON SURGERY     right  . TRIGGER FINGER RELEASE Right 11/26/2017   Procedure: RELEASE TRIGGER FINGER RIGHT THUMB;  Surgeon: Cammy Copa, MD;  Location: [facility] OR;  Service: Orthopedics;  Laterality: Right;     OB History   No obstetric history on file.     Family History  Problem Relation Age of Onset  . Hypertension Father   . Hypertension Other     Social History   Tobacco Use  . Smoking status: Current Some Day Smoker    Types: Cigarettes  . Smokeless tobacco: Never Used  . Tobacco comment: Smokes 1 cigarette/week for 2 years  Substance Use Topics  . Alcohol use: Yes    Comment: once a week; fifth of liquor  . Drug use: Yes    Frequency: 1.0 times per week    Types: Marijuana    Comment: daily,last 11/25/17    Home Medications Prior to Admission medications   Medication Sig Start Date End Date Taking? Authorizing Provider  acetaminophen (TYLENOL) 500 MG tablet Take 500 mg by mouth every 6 (six) hours as needed for mild pain.   Yes [provider]  chlorthalidone (HYGROTON) 25 MG tablet Take 1 tablet (25 mg total) by mouth daily. 11/09/19  Yes [name], MD  gabapentin (NEURONTIN) 300 MG capsule Take 1 capsule (300 mg total) by mouth 2 (two) times daily. Patient taking differently: Take 300 mg by mouth daily as needed. For nerve pain 11/11/19  Yes [name], MD  benzonatate [facility])  100 MG capsule Take 1 capsule (100 mg total) by mouth every 8 (eight) hours. 12/31/19   Suzy Bouchard, PA-C  cetirizine (ZYRTEC) 10 MG tablet Take 1 tablet (10 mg total) by mouth daily. Patient not taking: Reported on 08/15/2019 06/04/17   Billie Ruddy, MD  meloxicam (MOBIC) 15 MG tablet Take 1 tablet (15 mg total) by mouth daily. Patient not taking: Reported on 08/15/2019 04/19/19   Recardo Evangelist, PA-C  meloxicam (MOBIC) 15 MG tablet Take 1 tablet (15 mg total) by mouth daily. Patient not taking: Reported on 12/31/2019 07/20/19   Billie Ruddy,  MD  meloxicam (MOBIC) 7.5 MG tablet Take 2 tablets (15 mg total) by mouth daily as needed for pain. Patient not taking: Reported on 08/15/2019 04/22/19   Leandrew Koyanagi, MD  ondansetron (ZOFRAN ODT) 4 MG disintegrating tablet Take 1 tablet (4 mg total) by mouth every 8 (eight) hours as needed for nausea or vomiting. 12/31/19   Suzy Bouchard, PA-C  ondansetron (ZOFRAN) 4 MG tablet Take 1 tablet (4 mg total) by mouth every 6 (six) hours. Patient not taking: Reported on 08/15/2019 10/06/18   Hedges, Dellis Filbert, PA-C  tiZANidine (ZANAFLEX) 4 MG capsule Take 1 capsule (4 mg total) by mouth 3 (three) times daily as needed for muscle spasms. Patient not taking: Reported on 12/31/2019 11/11/19   Billie Ruddy, MD  traMADol (ULTRAM) 50 MG tablet Take 1-2 tablets (50-100 mg total) by mouth 2 (two) times daily as needed. Patient not taking: Reported on 08/15/2019 04/22/19   Leandrew Koyanagi, MD  zolpidem (AMBIEN) 5 MG tablet Take 1 tablet (5 mg total) by mouth at bedtime as needed for sleep. Patient not taking: Reported on 08/15/2019 05/15/18   Billie Ruddy, MD    Allergies    Patient has no known allergies.  Review of Systems   Review of Systems  Constitutional: Positive for fever. Negative for chills.  Eyes: Negative for photophobia and visual disturbance.  Respiratory: Positive for cough. Negative for shortness of breath.   Cardiovascular: Positive for chest pain. Negative for leg swelling.  Gastrointestinal: Negative for abdominal pain, diarrhea, nausea and vomiting.  Genitourinary: Negative for dysuria.  Musculoskeletal: Negative for back pain.  Neurological: Positive for headaches.    Physical Exam Updated Vital Signs BP (!) 144/87 (BP Location: Right Arm)   Pulse 92   Temp 99 F (37.2 C) (Oral)   Resp 16   Ht 5' 2.5" (1.588 m)   Wt 81.6 kg   SpO2 100%   BMI 32.40 kg/m   Physical Exam Vitals and nursing note reviewed.  Constitutional:      General: She is not in acute distress.     Appearance: She is not toxic-appearing.  HENT:     Head: Normocephalic.     Mouth/Throat:     Comments: Respirations equal and unlabored, patient able to speak in full sentences, lungs clear to auscultation bilaterally Eyes:     Pupils: Pupils are equal, round, and reactive to light.  Neck:     Comments: No meningismus. Cardiovascular:     Rate and Rhythm: Normal rate and regular rhythm.     Pulses: Normal pulses.     Heart sounds: Normal heart sounds. No murmur. No friction rub. No gallop.   Pulmonary:     Effort: Pulmonary effort is normal.     Breath sounds: Normal breath sounds.     Comments: Respirations equal and unlabored, patient able to speak in  full sentences, lungs clear to auscultation bilaterally Abdominal:     General: Abdomen is flat. There is no distension.     Palpations: Abdomen is soft.     Tenderness: There is no abdominal tenderness. There is no guarding or rebound.  Musculoskeletal:     Cervical back: Neck supple.     Comments: No lower extremity edema.  Negative Homans sign bilaterally.  Skin:    General: Skin is warm and dry.  Neurological:     General: No focal deficit present.     Mental Status: She is alert.     Comments: Speech is clear, able to follow commands CN III-XII intact Normal strength in upper and lower extremities bilaterally including dorsiflexion and plantar flexion, strong and equal grip strength Sensation grossly intact throughout Moves extremities without ataxia, coordination intact No pronator drift Ambulates without difficulty  Psychiatric:        Mood and Affect: Mood normal.        Behavior: Behavior normal.     ED Results / Procedures / Treatments   Labs (all labs ordered are listed, but only abnormal results are displayed) Labs Reviewed  CBC WITH DIFFERENTIAL/PLATELET - Abnormal; Notable for the following components:      Result Value   Abs Immature Granulocytes 0.11 (*)    All other components within normal limits    BASIC METABOLIC PANEL  I-STAT BETA HCG BLOOD, ED ([facility], [facility], [facility] ONLY)  TROPONIN I (HIGH SENSITIVITY)    EKG EKG Interpretation  Date/Time:  Thursday December 31 2019 16:45:10 EDT Ventricular Rate:  87 PR Interval:    QRS Duration: 101 QT Interval:  381 QTC Calculation: 459 R Axis:   20 Text Interpretation: Sinus rhythm ST elev, probable normal early repol pattern No acute changes Confirmed by Eber Hong ([id number]) on 12/31/2019 5:26:57 PM   Radiology DG Chest Portable 1 View  Result Date: 12/31/2019 CLINICAL DATA:  Chest pain, cough and headache EXAM: PORTABLE CHEST 1 VIEW COMPARISON:  Radiograph 08/15/2019 FINDINGS: Streaky opacities in the bases likely reflecting some chronic hypoinflation. No consolidation, features of edema, pneumothorax, or effusion. Pulmonary vascularity is normally distributed. The cardiomediastinal contours are unremarkable. No acute osseous or soft tissue abnormality. IMPRESSION: Likely chronic atelectatic changes in the bases. No other acute cardiopulmonary abnormality. Electronically Signed   By: Kreg Shropshire M.D.   On: 12/31/2019 16:15    Procedures Procedures (including critical care time)  Medications Ordered in ED Medications  ketorolac (TORADOL) 15 MG/ML injection 15 mg (0 mg Intravenous Hold 12/31/19 1737)  prochlorperazine (COMPAZINE) injection 10 mg (10 mg Intravenous Given 12/31/19 1619)  diphenhydrAMINE (BENADRYL) injection 12.5 mg (12.5 mg Intravenous Given 12/31/19 1619)    ED Course  I have reviewed the triage vital signs and the nursing notes.  Pertinent labs & imaging results that were available during my care of the patient were reviewed by me and considered in my medical decision making (see chart for details).    MDM Rules/Calculators/A&P                     49 year old female presents to the ED for an evaluation of productive cough, body aches, headache, and chest pain.  Patient notes last episode of chest pain was last night.  Upon  arrival, patient is afebrile, not tachycardic or hypoxic.  Patient in no acute distress and nontoxic-appearing.  No concern for sepsis at this time.  Physical exam reassuring.  Lungs clear to auscultation bilaterally.  Abdomen  soft, nondistended, nontender.  No lower extremity edema.  Negative Homans sign bilaterally. No meningismus to suggest meningitis. PERC Negative and low risk using Wells criteria.  Doubt PE/DVT.  Given patient admits to intermittent episodes of central chest pain, will obtain troponin and routine labs.  Also will obtain chest x-ray to rule out pneumonia and EKG to rule out ACS.  Compazine and Benadryl given for headache.  Low suspicion for [facility] or other emergent causes of headache. Normal neurological exam. We will hold Toradol until patient's pregnancy test comes back.  Patient deferred Covid test at this time. Suspect symptoms related to viral etiology, possible COVID.  Chest x-ray personally reviewed which demonstrates: IMPRESSION:  Likely chronic atelectatic changes in the bases. No other acute  cardiopulmonary abnormality.     CBC unremarkable with no leukocytosis and normal hemoglobin.  Pregnancy test negative.  BMP unremarkable with normal renal function no electrolyte derangements.  Troponin normal at 8.  No delta troponin needed at this time given last episode of chest pain was last evening.  No concern for ACS.  EKG personally reviewed which demonstrates normal sinus rhythm with possible early repolarization.  No changes from previous EKG.  5:50 PM reassessed patient at bedside and she notes improvement in her symptoms and she is ready to go home. Will discharge patient with cough medication and nausea medication. Advised patient take over-the-counter ibuprofen or Tylenol as needed for body aches and fever. Work note provided. Explained to patient I am unable to rule out Covid infection given she deferred Covid test. Strict ED precautions discussed with patient. Patient  states understanding and agrees to plan. Patient discharged home in no acute distress and stable vitals.   Kelly Patton was evaluated in Emergency Department on 12/31/2019 for the symptoms described in the history of present illness. She was evaluated in the context of the global COVID-19 pandemic, which necessitated consideration that the patient might be at risk for infection with the SARS-CoV-2 virus that causes COVID-19. Institutional protocols and algorithms that pertain to the evaluation of patients at risk for COVID-19 are in a state of rapid change based on information released by regulatory bodies including the CDC and federal and state organizations. These policies and algorithms were followed during the patient's care in the ED.  Final Clinical Impression(s) / ED Diagnoses Final diagnoses:  Cough  Nonspecific chest pain  Viral URI with cough    Rx / DC Orders ED Discharge Orders         Ordered    benzonatate (TESSALON) 100 MG capsule  Every 8 hours     12/31/19 1751    ondansetron (ZOFRAN ODT) 4 MG disintegrating tablet  Every 8 hours PRN     12/31/19 1751           Jesusita Oka 12/31/19 1755    Terald Sleeper, MD 12/31/19 1821

## 2019-12-31 NOTE — Discharge Instructions (Addendum)
As discussed, all of your labs, chest x-ray, and EKG were reassuring today. I suspect your symptoms are related to a viral infection. I am unable to rule out Covid given we did not get a Covid test today. I am sending you home with cough medication and nausea medication. Please take over-the-counter ibuprofen or Tylenol as needed for body aches and fever. Follow-up with PCP if symptoms do not improve within the next week. Return to the ER for new or worsening symptoms.

## 2020-01-05 ENCOUNTER — Other Ambulatory Visit: Payer: Self-pay

## 2020-01-06 ENCOUNTER — Other Ambulatory Visit: Payer: Self-pay

## 2020-01-06 ENCOUNTER — Ambulatory Visit (INDEPENDENT_AMBULATORY_CARE_PROVIDER_SITE_OTHER): Payer: PRIVATE HEALTH INSURANCE | Admitting: Family Medicine

## 2020-01-06 ENCOUNTER — Encounter: Payer: Self-pay | Admitting: Family Medicine

## 2020-01-06 VITALS — BP 110/82 | HR 98 | Temp 98.0°F | Wt 180.0 lb

## 2020-01-06 DIAGNOSIS — R05 Cough: Secondary | ICD-10-CM

## 2020-01-06 DIAGNOSIS — J302 Other seasonal allergic rhinitis: Secondary | ICD-10-CM

## 2020-01-06 DIAGNOSIS — R059 Cough, unspecified: Secondary | ICD-10-CM

## 2020-01-06 DIAGNOSIS — Z0000 Encounter for general adult medical examination without abnormal findings: Secondary | ICD-10-CM

## 2020-01-06 DIAGNOSIS — B354 Tinea corporis: Secondary | ICD-10-CM

## 2020-01-06 DIAGNOSIS — Z23 Encounter for immunization: Secondary | ICD-10-CM

## 2020-01-06 DIAGNOSIS — I10 Essential (primary) hypertension: Secondary | ICD-10-CM

## 2020-01-06 DIAGNOSIS — F1010 Alcohol abuse, uncomplicated: Secondary | ICD-10-CM

## 2020-01-06 DIAGNOSIS — Z87891 Personal history of nicotine dependence: Secondary | ICD-10-CM

## 2020-01-06 DIAGNOSIS — R635 Abnormal weight gain: Secondary | ICD-10-CM

## 2020-01-06 LAB — LDL CHOLESTEROL, DIRECT: Direct LDL: 120 mg/dL

## 2020-01-06 LAB — COMPREHENSIVE METABOLIC PANEL
ALT: 20 U/L (ref 0–35)
AST: 13 U/L (ref 0–37)
Albumin: 4.2 g/dL (ref 3.5–5.2)
Alkaline Phosphatase: 58 U/L (ref 39–117)
BUN: 13 mg/dL (ref 6–23)
CO2: 32 mEq/L (ref 19–32)
Calcium: 10 mg/dL (ref 8.4–10.5)
Chloride: 98 mEq/L (ref 96–112)
Creatinine, Ser: 0.65 mg/dL (ref 0.40–1.20)
GFR: 116.96 mL/min (ref >60.00)
Glucose, Bld: 124 mg/dL — ABNORMAL HIGH (ref 70–99)
Potassium: 4.2 mEq/L (ref 3.5–5.1)
Sodium: 138 mEq/L (ref 135–145)
Total Bilirubin: 0.3 mg/dL (ref 0.2–1.2)
Total Protein: 7.1 g/dL (ref 6.0–8.3)

## 2020-01-06 LAB — CBC WITH DIFFERENTIAL/PLATELET
Basophils Absolute: 0 10*3/uL (ref 0.0–0.1)
Basophils Relative: 0.4 % (ref 0.0–3.0)
Eosinophils Absolute: 0 10*3/uL (ref 0.0–0.7)
Eosinophils Relative: 0.6 % (ref 0.0–5.0)
HCT: 36.8 % (ref 36.0–46.0)
Hemoglobin: 12.5 g/dL (ref 12.0–15.0)
Lymphocytes Relative: 26.1 % (ref 12.0–46.0)
Lymphs Abs: 2 10*3/uL (ref 0.7–4.0)
MCHC: 33.9 g/dL (ref 30.0–36.0)
MCV: 90.4 fl (ref 78.0–100.0)
Monocytes Absolute: 0.7 10*3/uL (ref 0.1–1.0)
Monocytes Relative: 8.7 % (ref 3.0–12.0)
Neutro Abs: 4.9 10*3/uL (ref 1.4–7.7)
Neutrophils Relative %: 64.2 % (ref 43.0–77.0)
Platelets: 356 10*3/uL (ref 150.0–400.0)
RBC: 4.07 Mil/uL (ref 3.87–5.11)
RDW: 13 % (ref 11.5–15.5)
WBC: 7.6 10*3/uL (ref 4.0–10.5)

## 2020-01-06 LAB — T4, FREE: Free T4: 0.7 ng/dL (ref 0.60–1.60)

## 2020-01-06 LAB — LIPID PANEL
Cholesterol: 264 mg/dL — ABNORMAL HIGH (ref 0–200)
HDL: 54.6 mg/dL (ref >39.00)
Total CHOL/HDL Ratio: 5
Triglycerides: 462 mg/dL — ABNORMAL HIGH (ref 0.0–149.0)

## 2020-01-06 LAB — TSH: TSH: 0.43 u[IU]/mL (ref 0.35–4.50)

## 2020-01-06 LAB — HEMOGLOBIN A1C: Hgb A1c MFr Bld: 5.5 % (ref 4.6–6.5)

## 2020-01-06 MED ORDER — FEXOFENADINE HCL 180 MG PO TABS: 180.0000 mg | ORAL_TABLET | Freq: Every day | ORAL | 4 refills | Status: DC

## 2020-01-06 MED ORDER — MICONAZOLE NITRATE 2 % EX CREA: 1.0000 "application " | TOPICAL_CREAM | Freq: Two times a day (BID) | CUTANEOUS | 0 refills | Status: AC

## 2020-01-06 MED ORDER — MICONAZOLE NITRATE 2 % EX CREA: 1.0000 "application " | TOPICAL_CREAM | Freq: Two times a day (BID) | CUTANEOUS | 0 refills | Status: DC

## 2020-01-06 NOTE — Patient Instructions (Addendum)
Prescription for miconazole cream was sent to your pharmacy.  It is to use for the spot on your face.  You should use the cream twice a day for the next 2 to 3 weeks.  If you do not notice improvement in the area please notify the clinic.  A prescription for Allegra was also sent to your pharmacy.  Let us know if this helps with your cough.  Acid reflux can also contribute to your cough.  Preventive Care 50-50 Years Old, Female Preventive care refers to visits with your health care provider and lifestyle choices that can promote health and wellness. This includes:  A yearly physical exam. This may also be called an annual well check.  Regular dental visits and eye exams.  Immunizations.  Screening for certain conditions.  Healthy lifestyle choices, such as eating a healthy diet, getting regular exercise, not using drugs or products that contain nicotine and tobacco, and limiting alcohol use. What can I expect for my preventive care visit? Physical exam Your health care provider will check your:  Height and weight. This may be used to calculate body mass index (BMI), which tells if you are at a healthy weight.  Heart rate and blood pressure.  Skin for abnormal spots. Counseling Your health care provider may ask you questions about your:  Alcohol, tobacco, and drug use.  Emotional well-being.  Home and relationship well-being.  Sexual activity.  Eating habits.  Work and work Statistician.  Method of birth control.  Menstrual cycle.  Pregnancy history. What immunizations do I need?  Influenza (flu) vaccine  This is recommended every year. Tetanus, diphtheria, and pertussis (Tdap) vaccine  You may need a Td booster every 10 years. Varicella (chickenpox) vaccine  You may need this if you have not been vaccinated. Zoster (shingles) vaccine  You may need this after age 53. Measles, mumps, and rubella (MMR) vaccine  You may need at least one dose of MMR if you were  born in 1958 or later. You may also need a second dose. Pneumococcal conjugate (PCV13) vaccine  You may need this if you have certain conditions and were not previously vaccinated. Pneumococcal polysaccharide (PPSV23) vaccine  You may need one or two doses if you smoke cigarettes or if you have certain conditions. Meningococcal conjugate (MenACWY) vaccine  You may need this if you have certain conditions. Hepatitis A vaccine  You may need this if you have certain conditions or if you travel or work in places where you may be exposed to hepatitis A. Hepatitis B vaccine  You may need this if you have certain conditions or if you travel or work in places where you may be exposed to hepatitis B. Haemophilus influenzae type b (Hib) vaccine  You may need this if you have certain conditions. Human papillomavirus (HPV) vaccine  If recommended by your health care provider, you may need three doses over 6 months. You may receive vaccines as individual doses or as more than one vaccine together in one shot (combination vaccines). Talk with your health care provider about the risks and benefits of combination vaccines. What tests do I need? Blood tests  Lipid and cholesterol levels. These may be checked every 5 years, or more frequently if you are over 82 years old.  Hepatitis C test.  Hepatitis B test. Screening  Lung cancer screening. You may have this screening every year starting at age 37 if you have a 30-pack-year history of smoking and currently smoke or have quit within  the past 15 years.  Colorectal cancer screening. All adults should have this screening starting at age 38 and continuing until age 3. Your health care provider may recommend screening at age 72 if you are at increased risk. You will have tests every 1-10 years, depending on your results and the type of screening test.  Diabetes screening. This is done by checking your blood sugar (glucose) after you have not eaten  for a while (fasting). You may have this done every 1-3 years.  Mammogram. This may be done every 1-2 years. Talk with your health care provider about when you should start having regular mammograms. This may depend on whether you have a family history of breast cancer.  BRCA-related cancer screening. This may be done if you have a family history of breast, ovarian, tubal, or peritoneal cancers.  Pelvic exam and Pap test. This may be done every 3 years starting at age 7. Starting at age 68, this may be done every 5 years if you have a Pap test in combination with an HPV test. Other tests  Sexually transmitted disease (STD) testing.  Bone density scan. This is done to screen for osteoporosis. You may have this scan if you are at high risk for osteoporosis. Follow these instructions at home: Eating and drinking  Eat a diet that includes fresh fruits and vegetables, whole grains, lean protein, and low-fat dairy.  Take vitamin and mineral supplements as recommended by your health care provider.  Do not drink alcohol if: ? Your health care provider tells you not to drink. ? You are pregnant, may be pregnant, or are planning to become pregnant.  If you drink alcohol: ? Limit how much you have to 0-1 drink a day. ? Be aware of how much alcohol is in your drink. In the [place] one drink equals one 12 oz bottle of beer (355 mL), one 5 oz glass of wine (148 mL), or one 1 oz glass of hard liquor (44 mL). Lifestyle  Take daily care of your teeth and gums.  Stay active. Exercise for at least 30 minutes on 5 or more days each week.  Do not use any products that contain nicotine or tobacco, such as cigarettes, e-cigarettes, and chewing tobacco. If you need help quitting, ask your health care provider.  If you are sexually active, practice safe sex. Use a condom or other form of birth control (contraception) in order to prevent pregnancy and STIs (sexually transmitted infections).  If told by  your health care provider, take low-dose aspirin daily starting at age 64. What's next?  Visit your health care provider once a year for a well check visit.  Ask your health care provider how often you should have your eyes and teeth checked.  Stay up to date on all vaccines. This information is not intended to replace advice given to you by your health care provider. Make sure you discuss any questions you have with your health care provider. Document Revised: 05/08/2018 Document Reviewed: 05/08/2018 Elsevier Patient Education  [place].  Body Ringworm Body ringworm is an infection of the skin that often causes a ring-shaped rash. Body ringworm is also called tinea corporis. Body ringworm can affect any part of your skin. This condition is easily spread from person to person (is very contagious). What are the causes? This condition is caused by fungi called dermatophytes. The condition develops when these fungi grow out of control on the skin. You can get this condition if you  touch a person or animal that has it. You can also get it if you share any items with an infected person or pet. These include:  Clothing, bedding, and towels.  Brushes or combs.  Gym equipment.  Any other object that has the fungus on it. What increases the risk? You are more likely to develop this condition if you:  Play sports that involve close physical contact, such as wrestling.  Sweat a lot.  Live in areas that are hot and humid.  Use public showers.  Have a weakened immune system. What are the signs or symptoms? Symptoms of this condition include:  Itchy, raised red spots and bumps.  Red scaly patches.  A ring-shaped rash. The rash may have: ? A clear center. ? Scales or red bumps at its center. ? Redness near its borders. ? Dry and scaly skin on or around it. How is this diagnosed? This condition can usually be diagnosed with a skin exam. A skin scraping may be taken from  the affected area and examined under a microscope to see if the fungus is present. How is this treated? This condition may be treated with:  An antifungal cream or ointment.  An antifungal shampoo.  Antifungal medicines. These may be prescribed if your ringworm: ? Is severe. ? Keeps coming back. ? Lasts a long time. Follow these instructions at home:  Take over-the-counter and prescription medicines only as told by your health care provider.  If you were given an antifungal cream or ointment: ? Use it as told by your health care provider. ? Wash the infected area and dry it completely before applying the cream or ointment.  If you were given an antifungal shampoo: ? Use it as told by your health care provider. ? Leave the shampoo on your body for 3-5 minutes before rinsing.  While you have a rash: ? Wear loose clothing to stop clothes from rubbing and irritating it. ? Wash or change your bed sheets every night. ? Disinfect or throw out items that may be infected. ? Wash clothes and bed sheets in hot water. ? Wash your hands often with soap and water. If soap and water are not available, use hand sanitizer.  If your pet has the same infection, take your pet to see a veterinarian for treatment. How is this prevented?  Take a bath or shower every day and after every time you work out or play sports.  Dry your skin completely after bathing.  Wear sandals or shoes in public places and showers.  Change your clothes every day.  Wash athletic clothes after each use.  Do not share personal items with others.  Avoid touching red patches of skin on other people.  Avoid touching pets that have bald spots.  If you touch an animal that has a bald spot, wash your hands. Contact a health care provider if:  Your rash continues to spread after 7 days of treatment.  Your rash is not gone in 4 weeks.  The area around your rash gets red, warm, tender, and swollen. Summary  Body  ringworm is an infection of the skin that often causes a ring-shaped rash.  This condition is easily spread from person to person (is very contagious).  This condition may be treated with antifungal cream or ointment, antifungal shampoo, or antifungal medicines.  Take over-the-counter and prescription medicines only as told by your health care provider. This information is not intended to replace advice given to you by your  health care provider. Make sure you discuss any questions you have with your health care provider. Document Revised: 04/25/2018 Document Reviewed: 04/25/2018 Elsevier Patient Education  [place].  Alcohol Abuse and Dependence Information, Adult Alcohol is a widely available drug. People drink alcohol in different amounts. People who drink alcohol very often and in large amounts often have problems during and after drinking. They may develop what is called an alcohol use disorder. There are two main types of alcohol use disorders:  Alcohol abuse. This is when you use alcohol too much or too often. You may use alcohol to make yourself feel happy or to reduce stress. You may have a hard time setting a limit on the amount you drink.  Alcohol dependence. This is when you use alcohol consistently for a period of time, and your body changes as a result. This can make it hard to stop drinking because you may start to feel sick or feel different when you do not use alcohol. These symptoms are known as withdrawal. How can alcohol abuse and dependence affect me? Alcohol abuse and dependence can have a negative effect on your life. Drinking too much can lead to addiction. You may feel like you need alcohol to function normally. You may drink alcohol before work in the morning, during the day, or as soon as you get home from work in the evening. These actions can result in:  Poor work performance.  Job loss.  Financial problems.  Car crashes or criminal charges from driving  after drinking alcohol.  Problems in your relationships with friends and family.  Losing the trust and respect of coworkers, friends, and family. Drinking heavily over a long period of time can permanently damage your body and brain, and can cause lifelong health issues, such as:  Damage to your liver or pancreas.  Heart problems, high blood pressure, or stroke.  Certain cancers.  Decreased ability to fight infections.  Brain or nerve damage.  Depression.  Early (premature) death. If you are careless or you crave alcohol, it is easy to drink more than your body can handle (overdose). Alcohol overdose is a serious situation that requires hospitalization. It may lead to permanent injuries or death. What can increase my risk?  Having a family history of alcohol abuse.  Having depression or other mental health conditions.  Beginning to drink at an early age.  Binge drinking often.  Experiencing trauma, stress, and an unstable home life during childhood.  Spending time with people who drink often. What actions can I take to prevent or manage alcohol abuse and dependence?  Do not drink alcohol if: ? Your health care provider tells you not to drink. ? You are pregnant, may be pregnant, or are planning to become pregnant.  If you drink alcohol: ? Limit how much you use to:  0-1 drink a day for women.  0-2 drinks a day for men. ? Be aware of how much alcohol is in your drink. In the [place] one drink equals one 12 oz bottle of beer (355 mL), one 5 oz glass of wine (148 mL), or one 1 oz glass of hard liquor (44 mL).  Stop drinking if you have been drinking too much. This can be very hard to do if you are used to abusing alcohol. If you begin to have withdrawal symptoms, talk with your health care provider or a person that you trust. These symptoms may include anxiety, shaky hands, headache, nausea, sweating, or not being able  to sleep.  Choose to drink nonalcoholic beverages in  social gatherings and places where there may be alcohol. Activity  Spend more time on activities that you enjoy that do not involve alcohol, like hobbies or exercise.  Find healthy ways to cope with stress, such as exercise, meditation, or spending time with people you care about. General information  Talk to your family, coworkers, and friends about supporting you in your efforts to stop drinking. If they drink, ask them not to drink around you. Spend more time with people who do not drink alcohol.  If you think that you have an alcohol dependency problem: ? Tell friends or family about your concerns. ? Talk with your health care provider or another health professional about where to get help. ? Work with a Transport planner and a Regulatory affairs officer. ? Consider joining a support group for people who struggle with alcohol abuse and dependence. Where to find support   Your health care provider.  SMART Recovery: www.smartrecovery.org Therapy and support groups  Local treatment centers or chemical dependency counselors.  Local AA groups in your community: NicTax.com.pt Where to find more information  Centers for Disease Control and Prevention: http://www.wolf.info/  National Institute on Alcohol Abuse and Alcoholism: http://www.bradshaw.com/  Alcoholics Anonymous (AA): [employer]Tax.com.pt Contact a health care provider if:  You drank more or for longer than you intended on more than one occasion.  You tried to stop drinking or to cut back on how much you drink, but you were not able to.  You often drink to the point of vomiting or passing out.  You want to drink so badly that you cannot think about anything else.  You have problems in your life due to drinking, but you continue to drink.  You keep drinking even though you feel anxious, depressed, or have experienced memory loss.  You have stopped doing the things you used to enjoy in order to drink.  You have to drink more than you used to in  order to get the effect you want.  You experience anxiety, sweating, nausea, shakiness, and trouble sleeping when you try to stop drinking. Get help right away if:  You have thoughts about hurting yourself or others.  You have serious withdrawal symptoms, including: ? Confusion. ? Racing heart. ? High blood pressure. ? Fever. If you ever feel like you may hurt yourself or others, or have thoughts about taking your own life, get help right away. You can go to your nearest emergency department or call:  Your local emergency services (911 in the [place]  A suicide crisis helpline, such as the [place] at 3612019222. This is open 24 hours a day. Summary  Alcohol abuse and dependence can have a negative effect on your life. Drinking too much or too often can lead to addiction.  If you drink alcohol, limit how much you use.  If you are having trouble keeping your drinking under control, find ways to change your behavior. Hobbies, calming activities, exercise, or support groups can help.  If you feel you need help with changing your drinking habits, talk with your health care provider, a good friend, or a therapist, or go to an [place] group. This information is not intended to replace advice given to you by your health care provider. Make sure you discuss any questions you have with your health care provider. Document Revised: 12/16/2018 Document Reviewed: 11/04/2018 Elsevier Patient Education  [place].  Cough, Adult Coughing is a reflex  that clears your throat and your airways (respiratory system). Coughing helps to heal and protect your lungs. It is normal to cough occasionally, but a cough that happens with other symptoms or lasts a long time may be a sign of a condition that needs treatment. An acute cough may only last 2-3 weeks, while a chronic cough may last 8 or more weeks. Coughing is commonly caused by:  Infection of the respiratory systemby  viruses or bacteria.  Breathing in substances that irritate your lungs.  Allergies.  Asthma.  Mucus that runs down the back of your throat (postnasal drip).  Smoking.  Acid backing up from the stomach into the esophagus (gastroesophageal reflux).  Certain medicines.  Chronic lung problems.  Other medical conditions such as heart failure or a blood clot in the lung (pulmonary embolism). Follow these instructions at home: Medicines  Take over-the-counter and prescription medicines only as told by your health care provider.  Talk with your health care provider before you take a cough suppressant medicine. Lifestyle   Avoid cigarette smoke. Do not use any products that contain nicotine or tobacco, such as cigarettes, e-cigarettes, and chewing tobacco. If you need help quitting, ask your health care provider.  Drink enough fluid to keep your urine pale yellow.  Avoid caffeine.  Do not drink alcohol if your health care provider tells you not to drink. General instructions   Pay close attention to changes in your cough. Tell your health care provider about them.  Always cover your mouth when you cough.  Avoid things that make you cough, such as perfume, candles, cleaning products, or campfire or tobacco smoke.  If the air is dry, use a cool mist vaporizer or humidifier in your bedroom or your home to help loosen secretions.  If your cough is worse at night, try to sleep in a semi-upright position.  Rest as needed.  Keep all follow-up visits as told by your health care provider. This is important. Contact a health care provider if you:  Have new symptoms.  Cough up pus.  Have a cough that does not get better after 2-3 weeks or gets worse.  Cannot control your cough with cough suppressant medicines and you are losing sleep.  Have pain that gets worse or pain that is not helped with medicine.  Have a fever.  Have unexplained weight loss.  Have night  sweats. Get help right away if:  You cough up blood.  You have difficulty breathing.  Your heartbeat is very fast. These symptoms may represent a serious problem that is an emergency. Do not wait to see if the symptoms will go away. Get medical help right away. Call your local emergency services (911 in the [place] Do not drive yourself to the hospital. Summary  Coughing is a reflex that clears your throat and your airways. It is normal to cough occasionally, but a cough that happens with other symptoms or lasts a long time may be a sign of a condition that needs treatment.  Take over-the-counter and prescription medicines only as told by your health care provider.  Always cover your mouth when you cough.  Contact a health care provider if you have new symptoms or a cough that does not get better after 2-3 weeks or gets worse. This information is not intended to replace advice given to you by your health care provider. Make sure you discuss any questions you have with your health care provider. Document Revised: 09/15/2018 Document Reviewed: 09/15/2018 Elsevier Patient  Education  2020 [employer]  

## 2020-01-06 NOTE — Progress Notes (Addendum)
Subjective:     Kelly Patton is a 49 y.o. female and is here for a comprehensive physical exam. The patient reports problems - cough, allergies.  Pt with ongoing cough.  Seen in ED on 4/22, dx'd with URI.  CXR normal.  Pt given tessalon and zofran.  Pt notes occasional coughing spells.  Pt had negative COVID testing at her job.  Tried nyquil.  Pt also noted post nasal drainage.  In the past took allegra for her allergies.  Pt endorses gaining weight over the last year.  Pt mentions she needed a vacation so she went to rehab in [place] x 3 wks.  Since her return, using MJ daily and drinking less.  A half gallon of liquor may last a little over a wk.   Pt notes getting a few new jobs. Pt had 2 doses of covid vaccine. Social History   Socioeconomic History  . Marital status: Single    Spouse name: Not on file  . Number of children: Not on file  . Years of education: Not on file  . Highest education level: Not on file  Occupational History  . Not on file  Tobacco Use  . Smoking status: Current Some Day Smoker    Types: Cigarettes  . Smokeless tobacco: Never Used  . Tobacco comment: Smokes 1 cigarette/week for 2 years  Substance and Sexual Activity  . Alcohol use: Yes    Comment: once a week; fifth of liquor  . Drug use: Yes    Frequency: 1.0 times per week    Types: Marijuana    Comment: daily,last 11/25/17  . Sexual activity: Yes    Birth control/protection: None  Other Topics Concern  . Not on file  Social History Narrative  . Not on file   Social Determinants of Health   Financial Resource Strain:   . Difficulty of Paying Living Expenses:   Food Insecurity:   . Worried About Programme researcher, broadcasting/film/video in the Last Year:   . Barista in the Last Year:   Transportation Needs:   . Freight forwarder (Medical):   Marland Kitchen Lack of Transportation (Non-Medical):   Physical Activity:   . Days of Exercise per Week:   . Minutes of Exercise per Session:   Stress:   .  Feeling of Stress :   Social Connections:   . Frequency of Communication with Friends and Family:   . Frequency of Social Gatherings with Friends and Family:   . Attends Religious Services:   . Active Member of Clubs or Organizations:   . Attends Banker Meetings:   Marland Kitchen Marital Status:   Intimate Partner Violence:   . Fear of Current or Ex-Partner:   . Emotionally Abused:   Marland Kitchen Physically Abused:   . Sexually Abused:    Health Maintenance  Topic Date Due  . COVID-19 Vaccine (1) Never done  . TETANUS/TDAP  Never done  . INFLUENZA VACCINE  04/10/2020  . PAP SMEAR-Modifier  07/04/2020  . HIV Screening  Completed    The following portions of the patient's history were reviewed and updated as appropriate: allergies, current medications, past family history, past medical history, past social history, past surgical history and problem list.  Review of Systems Pertinent items noted in HPI and remainder of comprehensive ROS otherwise negative.   Objective:    BP 110/82 (BP Location: Left Arm, Patient Position: Sitting, Cuff Size: Large)   Pulse 98   Temp 98 F (  36.7 C) (Temporal)   Wt 180 lb (81.6 kg)   SpO2 97%   BMI 32.40 kg/m  General appearance: alert, cooperative and no distress Head: Normocephalic, without obvious abnormality, atraumatic Eyes: conjunctivae/corneas clear. PERRL, EOM's intact. Fundi benign. Ears: normal TM's and external ear canals both ears Nose: Nares normal. Septum midline. Mucosa normal. No drainage or sinus tenderness. Throat: lips, mucosa, and tongue normal; teeth and gums normal Neck: no adenopathy, no carotid bruit, no JVD, supple, symmetrical, trachea midline and thyroid not enlarged, symmetric, no tenderness/mass/nodules Lungs: clear to auscultation bilaterally Heart: regular rate and rhythm, S1, S2 normal, no murmur, click, rub or gallop Abdomen: soft, non-tender; bowel sounds normal; no masses,  no organomegaly Extremities: extremities  normal, atraumatic, no cyanosis or edema Pulses: 2+ and symmetric Skin: warm, dry, intact. circumscribed area of dry skin with rolled edges on lateral R chin  Lymph nodes: Cervical, supraclavicular, and axillary nodes normal. Neurologic: Alert and oriented X 3, normal strength and tone. Normal symmetric reflexes. Normal coordination and gait    Assessment:    Healthy female exam.      Plan:     Anticipatory guidance given including wearing seatbelts, smoke detectors in the home, increasing physical activity, increasing p.o. intake of water and vegetables. -will obtain labs -pt to schedule mammogram -pap up to date done 07/04/17.  Due 07/04/22 See After Visit Summary for Counseling Recommendations    Seasonal allergies  - Plan: fexofenadine (ALLEGRA) 180 MG tablet  Tinea corporis  -area on R chin - Plan: miconazole (MICOTIN) 2 % cream  Essential hypertension  -controlled -Continue chlorthalidone 25 mg -Continue lifestyle modifications - Plan: Lipid panel, Comprehensive metabolic panel  Cough -discussed possible causes including allergies, GERD, bronchitis -Patient reassured as chest x-ray from ED visit on 12/31/2019 was negative aside from hypoinflation -will start Allegra to see if cough improves. -Given precautions for continued symptoms  Weight gain -Discussed lifestyle modifications -Patient encouraged to increase physical activity -Patient encouraged to decrease alcohol consumption - Plan: TSH, T4, Free, Hemoglobin A1c  Alcohol abuse -Patient drinking 1/2 gallon per 7-10 days -Discussed cutting down -Given precautions -We will continue to monitor  Smoking history -Smoking cessation greater than 3 minutes, less than 10 minutes -Pt not ready to quit.  Need for Tdap vaccination  - Plan: Tdap vaccine greater than or equal to 7yo IM  F/u prn in 1 month  Grier Mitts, MD

## 2020-01-11 ENCOUNTER — Other Ambulatory Visit: Payer: Self-pay

## 2020-01-11 ENCOUNTER — Telehealth: Payer: Self-pay | Admitting: Family Medicine

## 2020-01-11 DIAGNOSIS — J302 Other seasonal allergic rhinitis: Secondary | ICD-10-CM

## 2020-01-11 MED ORDER — LORATADINE 10 MG PO TABS: 10.0000 mg | ORAL_TABLET | Freq: Every day | ORAL | 3 refills | Status: DC

## 2020-01-11 NOTE — Telephone Encounter (Signed)
Rx changed and sent to pt pharmacy as requested

## 2020-01-11 NOTE — Telephone Encounter (Signed)
Pt would like a prescription for claritin d instead of allegra sent to [facility] on [facility].

## 2020-01-29 ENCOUNTER — Telehealth: Payer: Self-pay | Admitting: Family Medicine

## 2020-01-29 NOTE — Telephone Encounter (Signed)
Error  Pt is scheduled for virtual visit instead

## 2020-02-03 ENCOUNTER — Telehealth: Payer: Self-pay | Admitting: Family Medicine

## 2020-02-03 ENCOUNTER — Encounter: Payer: Self-pay | Admitting: Family Medicine

## 2020-02-03 NOTE — Progress Notes (Signed)
Patient called at 4:30 PM on 02/03/2020 however she did not answer her appointment.  Unable to leave voicemail as voicemail has not been set up.    Unsuccessful at continued attempts to reach patient.

## 2020-04-05 ENCOUNTER — Other Ambulatory Visit: Payer: Self-pay | Admitting: Family Medicine

## 2020-04-05 DIAGNOSIS — I10 Essential (primary) hypertension: Secondary | ICD-10-CM

## 2020-04-05 MED ORDER — CHLORTHALIDONE 25 MG PO TABS: 25.0000 mg | ORAL_TABLET | Freq: Every day | ORAL | 0 refills | Status: DC

## 2020-04-05 NOTE — Telephone Encounter (Signed)
Rx sent in as requested. 

## 2020-04-05 NOTE — Telephone Encounter (Signed)
Pt is requesting a refill on Chlorthalidone 25 mg. Pt uses [facility] on [address]

## 2020-04-05 NOTE — Addendum Note (Signed)
Addended by: Kathreen Devoid on: 04/05/2020 02:37 PM   Modules accepted: Orders

## 2020-04-07 ENCOUNTER — Ambulatory Visit (INDEPENDENT_AMBULATORY_CARE_PROVIDER_SITE_OTHER): Payer: 59 | Admitting: Family Medicine

## 2020-04-07 ENCOUNTER — Other Ambulatory Visit: Payer: Self-pay

## 2020-04-07 ENCOUNTER — Encounter: Payer: Self-pay | Admitting: Family Medicine

## 2020-04-07 VITALS — BP 110/78 | HR 108 | Temp 98.2°F | Wt 180.0 lb

## 2020-04-07 DIAGNOSIS — R140 Abdominal distension (gaseous): Secondary | ICD-10-CM

## 2020-04-07 DIAGNOSIS — M25561 Pain in right knee: Secondary | ICD-10-CM

## 2020-04-07 DIAGNOSIS — F1010 Alcohol abuse, uncomplicated: Secondary | ICD-10-CM | POA: Diagnosis not present

## 2020-04-07 DIAGNOSIS — R635 Abnormal weight gain: Secondary | ICD-10-CM | POA: Diagnosis not present

## 2020-04-07 DIAGNOSIS — R1032 Left lower quadrant pain: Secondary | ICD-10-CM

## 2020-04-07 MED ORDER — DICLOFENAC SODIUM 1 % EX GEL: 2.0000 g | Freq: Four times a day (QID) | CUTANEOUS | 0 refills | Status: DC

## 2020-04-07 NOTE — Progress Notes (Signed)
Subjective:    Patient ID: Kelly Patton, female    DOB: Feb 18, 1971, 49 y.o.   MRN: [id number]  No chief complaint on file.   HPI Pt is a 49 yo female with pmh sig for HTN, lumbar back pain, insomnia, alcohol abuse who was seen for acute concern.  Pt endorses anterior right knee pain x1 month.  Denies injury, hearing popping, feeling of tearing, or falls.  Pt tried taking 2 Aleve, Tylenol 975 mg several times per day without relief.  Pt endorses increasiing abdominal distention, tightness, and LLQ pain x several months.  Having regular BMs daily.  Pt endorses a recent episode of dizziness, SOB.  Notes unable to bend down to reach knee or feet 2/2 large abdomen.  Pt drinking 1/5 of absolute vodka every 4 days.  Pt states this is considerably less than she was drinking.  Previously drank 1 handle every few days.  Pt denies tremors, jaundice, easy bruising, needing an eye opener.  Past Medical History:  Diagnosis Date  . Anxiety   . Carpal tunnel syndrome, right   . Depression   . GERD (gastroesophageal reflux disease)   . Hypertension   . Shortness of breath dyspnea    with exertion uses inhaler    No Known Allergies  ROS General: Denies fever, chills, night sweats, changes in appetite  +changes in weight HEENT: Denies headaches, ear pain, changes in vision, rhinorrhea, sore throat CV: Denies CP, palpitations, SOB, orthopnea Pulm: Denies SOB, cough, wheezing GI: Denies abdominal pain, nausea, vomiting, diarrhea, constipation  + enlarged abdomen, LLQ pain GU: Denies dysuria, hematuria, frequency, vaginal discharge Msk: Denies muscle cramps  +R knee pain Neuro: Denies weakness, numbness, tingling Skin: Denies rashes, bruising Psych: Denies depression, anxiety, hallucinations    Objective:    Blood pressure 110/78, pulse (!) 108, temperature 98.2 F (36.8 C), temperature source Oral, weight 180 lb (81.6 kg), SpO2 97 %.   Gen. Pleasant, well-nourished, in no distress,  normal affect   HEENT: [place]/AT, face symmetric, conjunctiva clear, no scleral icterus, PERRLA, EOMI, nares patent without drainage Neck: No JVD, no thyromegaly Lungs: no accessory muscle use, CTAB, no wheezes or rales Cardiovascular: RRR, no m/r/g, no peripheral edema Abdomen: Significant abd distention, mildly visible veins flank and lateral abdomen, no caput medusa. BS present, soft, NT Musculoskeletal: Normal appearing knees bilaterally.  Right knee with mild crepitus.  TTP inferior to R patella.  No TTP of joint line bilaterally.  No effusion.  No crepitus in left knee.  No deformities, no cyanosis or clubbing, normal tone Neuro:  A&Ox3, CN II-XII intact, normal gait Skin:  Warm, no lesions/ rash.  No spider telangiectasias.  Wt Readings from Last 3 Encounters:  04/07/20 180 lb (81.6 kg)  01/06/20 180 lb (81.6 kg)  12/31/19 180 lb (81.6 kg)    Lab Results  Component Value Date   WBC 7.6 01/06/2020   HGB 12.5 01/06/2020   HCT 36.8 01/06/2020   PLT 356.0 01/06/2020   GLUCOSE 124 (H) 01/06/2020   CHOL 264 (H) 01/06/2020   TRIG (H) 01/06/2020    462.0 Triglyceride is over 400; calculations on Lipids are invalid.   HDL 54.60 01/06/2020   LDLDIRECT 120.0 01/06/2020   ALT 20 01/06/2020   AST 13 01/06/2020   NA 138 01/06/2020   K 4.2 01/06/2020   CL 98 01/06/2020   CREATININE 0.65 01/06/2020   BUN 13 01/06/2020   CO2 32 01/06/2020   TSH 0.43 01/06/2020   HGBA1C 5.5  01/06/2020    Assessment/Plan:  Acute pain of right knee  -Possibly 2/2 knee sprain versus arthritis -Discussed obtaining imaging -Discussed supportive care. -Given abdominal distention and possible hepatic disease patient should avoid Tylenol - Plan: DG Knee Complete 4 Views Right, diclofenac Sodium (VOLTAREN) 1 % GEL  Abdominal distention  -Likely due to ascites from alcohol abuse -We will obtain labs -We will likely need diagnostic paracenteses.  Will place referral to GI after lab results obtained. -  Plan: Amylase, CBC with Differential/Platelets, CMP with eGFR([facility]), Gamma GT, Hep C Antibody, Lipase, Protime-INR, Protime-INR, Lipase, Hep C Antibody, Gamma GT, CMP with eGFR([facility]), CBC with Differential/Platelets, Amylase  Weight gain  -Weight 162 lbs in August 2020.  Now 180 lbs -Likely 2/2 increased abdominal girth possibly from ascites -Pt advised to cut down alcohol intake - Plan: T4, Free, TSH  Alcohol abuse -drinking 1/5th of vodka q 4 days.  Was drinking a handle. -denies needing an eye opener, feeling guilty, being annoyed. -Discussed need to continue cutting down on alcohol intake with plans to eventually stop.  Patient advised not to abruptly stop. -We will obtain labs.  We will try to add folate, B12 to recently ordered labs.  LLQ pain -possibly 2/2 increased abd distention.  Also consider ovarian torsion or mass, hernia.  Constipation less likely as having regular BMs. -consider CT abd and pelvis based on labs  F/u in the next 1 wk  Grier Mitts, MD

## 2020-04-07 NOTE — Patient Instructions (Addendum)
Abdominal Bloating When you have abdominal bloating, your abdomen may feel full, tight, or painful. It may also look bigger than normal or swollen (distended). Common causes of abdominal bloating include:  Swallowing air.  Constipation.  Problems digesting food.  Eating too much.  Irritable bowel syndrome. This is a condition that affects the large intestine.  Lactose intolerance. This is an inability to digest lactose, a natural sugar in dairy products.  Celiac disease. This is a condition that affects the ability to digest gluten, a protein found in some grains.  Gastroparesis. This is a condition that slows down the movement of food in the stomach and small intestine. It is more common in people with diabetes mellitus.  Gastroesophageal reflux disease (GERD). This is a digestive condition that makes stomach acid flow back into the esophagus.  Urinary retention. This means that the body is holding onto urine, and the bladder cannot be emptied all the way. Follow these instructions at home: Eating and drinking  Avoid eating too much.  Try not to swallow air while talking or eating.  Avoid eating while lying down.  Avoid these foods and drinks: ? Foods that cause gas, such as broccoli, cabbage, cauliflower, and baked beans. ? Carbonated drinks. ? Hard candy. ? Chewing gum. Medicines  Take over-the-counter and prescription medicines only as told by your health care provider.  Take probiotic medicines. These medicines contain live bacteria or yeasts that can help digestion.  Take coated peppermint oil capsules. Activity  Try to exercise regularly. Exercise may help to relieve bloating that is caused by gas and relieve constipation. General instructions  Keep all follow-up visits as told by your health care provider. This is important. Contact a health care provider if:  You have nausea and vomiting.  You have diarrhea.  You have abdominal pain.  You have unusual  weight loss or weight gain.  You have severe pain, and medicines do not help. Get help right away if:  You have severe chest pain.  You have trouble breathing.  You have shortness of breath.  You have trouble urinating.  You have darker urine than normal.  You have blood in your stools or have dark, tarry stools. Summary  Abdominal bloating means that the abdomen is swollen.  Common causes of abdominal bloating are swallowing air, constipation, and problems digesting food.  Avoid eating too much and avoid swallowing air.  Avoid foods that cause gas, carbonated drinks, hard candy, and chewing gum. This information is not intended to replace advice given to you by your health care provider. Make sure you discuss any questions you have with your health care provider. Document Revised: 12/15/2018 Document Reviewed: 09/28/2016 Elsevier Patient Education  2020 Elsevier Inc.  Acute Knee Pain, Adult Acute knee pain is sudden and may be caused by damage, swelling, or irritation of the muscles and tissues that support your knee. The injury may result from:  A fall.  An injury to your knee from twisting motions.  A hit to the knee.  Infection. Acute knee pain may go away on its own with time and rest. If it does not, your health care provider may order tests to find the cause of the pain. These may include:  Imaging tests, such as an X-ray, MRI, or ultrasound.  Joint aspiration. In this test, fluid is removed from the knee.  Arthroscopy. In this test, a lighted tube is inserted into the knee and an image is projected onto a TV screen.  Biopsy. In  this test, a sample of tissue is removed from the body and studied under a microscope. Follow these instructions at home: Pay attention to any changes in your symptoms. Take these actions to relieve your pain. If you have a knee sleeve or brace:   Wear the sleeve or brace as told by your health care provider. Remove it only as told  by your health care provider.  Loosen the sleeve or brace if your toes tingle, become numb, or turn cold and blue.  Keep the sleeve or brace clean.  If the sleeve or brace is not waterproof: ? Do not let it get wet. ? Cover it with a watertight covering when you take a bath or shower. Activity  Rest your knee.  Do not do things that cause pain or make pain worse.  Avoid high-impact activities or exercises, such as running, jumping rope, or doing jumping jacks.  Work with a physical therapist to make a safe exercise program, as recommended by your health care provider. Do exercises as told by your physical therapist. Managing pain, stiffness, and swelling   If directed, put ice on the knee: ? Put ice in a plastic bag. ? Place a towel between your skin and the bag. ? Leave the ice on for 20 minutes, 2-3 times a day.  If directed, use an elastic bandage to put pressure (compression) on your injured knee. This may control swelling, give support, and help with discomfort. General instructions  Take over-the-counter and prescription medicines only as told by your health care provider.  Raise (elevate) your knee above the level of your heart when you are sitting or lying down.  Sleep with a pillow under your knee.  Do not use any products that contain nicotine or tobacco, such as cigarettes, e-cigarettes, and chewing tobacco. These can delay healing. If you need help quitting, ask your health care provider.  If you are overweight, work with your health care provider and a dietitian to set a weight-loss goal that is healthy and reasonable for you. Extra weight can put pressure on your knee.  Keep all follow-up visits as told by your health care provider. This is important. Contact a health care provider if:  Your knee pain continues, changes, or gets worse.  You have a fever along with knee pain.  Your knee feels warm to the touch.  Your knee buckles or locks up. Get help  right away if:  Your knee swells, and the swelling becomes worse.  You cannot move your knee.  You have severe pain in your knee. Summary  Acute knee pain can be caused by a fall, an injury, an infection, or damage, swelling, or irritation of the tissues that support your knee.  Your health care provider may perform tests to find out the cause of the pain.  Pay attention to any changes in your symptoms. Relieve your pain with rest, medicines, light activity, and use of ice.  Get help if your pain continues or becomes worse, your knee swells, or you cannot move your knee. This information is not intended to replace advice given to you by your health care provider. Make sure you discuss any questions you have with your health care provider. Document Revised: 02/06/2018 Document Reviewed: 02/06/2018 Elsevier Patient Education  2020 [employer].  Alcohol Abuse and Nutrition Alcohol abuse is any pattern of alcohol consumption that harms your health, relationships, or work. Alcohol abuse can cause poor nutrition (malnutrition or malnourishment) and a lack of nutrients (  nutrient deficiencies), which can lead to more complications. Alcohol abuse brings malnutrition and nutrient deficiencies in two ways:  It causes your liver to work abnormally. This affects how your body divides (breaks down) and absorbs nutrients from food.  It causes you to eat poorly. Many people who abuse alcohol do not eat enough carbohydrates, protein, fat, vitamins, and minerals. Nutrients that are commonly lacking (deficient) in people who abuse alcohol include:  Vitamins. ? Vitamin A. This is needed for your vision, metabolism, and ability to fight off infections (immunity). ? B vitamins. These include folate, thiamine, and niacin. These are needed for new cell growth. ? Vitamin C. This plays an important role in wound healing, immunity, and helping your body to absorb iron. ? Vitamin D. This is necessary for your  body to absorb and use calcium. It is produced by your liver, but you can also get it from food and from sun exposure.  Minerals. ? Calcium. This is needed for healthy bones as well as heart and blood vessel (cardiovascular) function. ? Iron. This is important for blood, muscle, and nervous system functioning. ? Magnesium. This plays an important role in muscle and nerve function, and it helps to control blood sugar and blood pressure. ? Zinc. This is important for the normal functioning of your nervous system and digestive system (gastrointestinal tract). If you think that you have an alcohol dependency problem, or if it is hard to stop drinking because you feel sick or different when you do not use alcohol, talk with your health care provider or another health professional about where to get help. Nutrition is an essential factor in therapy for alcohol abuse. Your health care provider or diet and nutrition specialist (dietitian) will work with you to design a plan that can help to restore nutrients to your body and prevent the risk of complications. What is my plan? Your dietitian may develop a specific eating plan that is based on your condition and any other problems that you have. An eating plan will commonly include:  A balanced diet. ? Grains: 6-8 oz (170-227 g) a day. Examples of 1 oz of whole grains include 1 cup of whole-wheat cereal,  cup of brown rice, or 1 slice of whole-wheat bread. ? Vegetables: 2-3 cups a day. Examples of 1 cup of vegetables include 2 medium carrots, 1 large tomato, or 2 stalks of celery. ? Fruits: 1-2 cups a day. Examples of 1 cup of fruit include 1 large banana, 1 small apple, 8 large strawberries, or 1 large orange. ? Meat and other protein: 5-6 oz (142-170 g) a day.  A cut of meat or fish that is the size of a deck of cards is about 3-4 oz.  Foods that provide 1 oz of protein include 1 egg,  cup of nuts or seeds, or 1 tablespoon (16 g) of peanut  butter. ? Dairy: 2-3 cups a day. Examples of 1 cup of dairy include 8 oz (230 mL) of milk, 8 oz (230 g) of yogurt, or 1 oz (44 g) of natural cheese.  Vitamin and mineral supplements. What are tips for following this plan?  Eat frequent meals and snacks. Try to eat 5-6 small meals each day.  Take vitamin or mineral supplements as recommended by your dietitian.  If you are malnourished or if your dietitian recommends it: ? You may follow a high-protein, high-calorie diet. This may include:  2,000-3,000 calories (kilocalories) a day.  70-100 g (grams) of protein a day. ?  You may be directed to follow a diet that includes a complete nutritional supplement beverage. This can help to restore calories, protein, and vitamins to your body. Depending on your condition, you may be advised to consume this beverage instead of your meals or in addition to them.  Certain medicines may cause changes in your appetite, taste, and weight. Work with your health care provider and dietitian to make any changes to your medicines and eating plan.  If you are unable to take in enough food and calories by mouth, your health care provider may recommend a feeding tube. This tube delivers nutritional supplements directly to your stomach. Recommended foods  Eat foods that are high in molecules that prevent oxygen from reacting with your food (antioxidants). These foods include grapes, berries, nuts, green tea, and dark green or orange vegetables. Eating these can help to prevent some of the stress that is placed on your liver by consuming alcohol.  Eat a variety of fresh fruits and vegetables each day. This will help you to get fiber and vitamins in your diet.  Drink plenty of water and other clear fluids, such as apple juice and broth. Try to drink at least 48-64 oz (1.5-2 L) of water a day.  Include foods fortified with vitamins and minerals in your diet. Commonly fortified foods include milk, orange juice,  cereal, and bread.  Eat a variety of foods that are high in omega-3 and omega-6 fatty acids. These include fish, nuts and seeds, and soybeans. These foods may help your liver to recover and may also stabilize your mood.  If you are a vegetarian: ? Eat a variety of protein-rich foods. ? Pair whole grains with plant-based proteins at meals and snack time. For example, eat rice with beans, put peanut butter on whole-grain toast, or eat oatmeal with sunflower seeds. The items listed above may not be a complete list of foods and beverages you can eat. Contact a dietitian for more information. Foods to avoid  Avoid foods and drinks that are high in fat and sugar. Sugary drinks, salty snacks, and candy contain empty calories. This means that they lack important nutrients such as protein, fiber, and vitamins.  Avoid alcohol. This is the best way to avoid malnutrition due to alcohol abuse. If you must drink, drink measured amounts. Measured drinking means limiting your intake to no more than 1 drink a day for nonpregnant women and 2 drinks a day for men. One drink equals 12 oz (355 mL) of beer, 5 oz (148 mL) of wine, or 1 oz (44 mL) of hard liquor.  Limit your intake of caffeine. Replace drinks like coffee and black tea with decaffeinated coffee and decaffeinated herbal tea. The items listed above may not be a complete list of foods and beverages you should avoid. Contact a dietitian for more information. Summary  Alcohol abuse can cause poor nutrition (malnutrition or malnourishment) and a lack of nutrients (nutrient deficiencies), which can lead to more health problems.  Common nutrient deficiencies include vitamin deficiencies (A, B, C, and D) and mineral deficiencies (calcium, iron, magnesium, and zinc).  Nutrition is an essential factor in therapy for alcohol abuse.  Your health care provider and dietitian can help you to develop a specific eating plan that includes a balanced diet plus vitamin  and mineral supplements. This information is not intended to replace advice given to you by your health care provider. Make sure you discuss any questions you have with your health care provider. Document  Revised: 12/16/2018 Document Reviewed: 05/14/2017 Elsevier Patient Education  2020 Elsevier Inc.  Ascites  Ascites is a collection of too much fluid in the abdomen. Ascites can range from mild to severe. If ascites is not treated, it can get worse. What are the causes? This condition may be caused by:  A liver condition called cirrhosis. This is the most common cause of ascites.  Long-term (chronic) or alcoholic hepatitis.  Infection or inflammation in the abdomen.  Cancer in the abdomen.  Heart failure.  Kidney disease.  Inflammation of the pancreas.  Clots in the veins of the liver. What are the signs or symptoms? Symptoms of this condition include:  A feeling of fullness in the abdomen. This is common.  An increase in the size of the abdomen or waist.  Swelling in the legs.  Swelling of the scrotum (in men).  Difficulty breathing.  Pain in the abdomen.  Sudden weight gain. If the condition is mild, you may not have symptoms. How is this diagnosed? This condition is diagnosed based on your medical history and a physical exam. Your health care provider may order imaging tests, such as an ultrasound or CT scan of your abdomen. How is this treated? Treatment for this condition depends on the cause of the ascites. It may include:  Taking a pill to make you urinate. This is called a water pill (diuretic pill).  Strictly reducing your salt (sodium) intake. Salt can cause extra fluid to be kept (retained) in the body, and this makes ascites worse.  Having a procedure to remove fluid from your abdomen (paracentesis).  Having a procedure that connects two of the major veins within your liver and relieves pressure on your liver. This is called a TIPS procedure  (transjugular intrahepatic portosystemic shunt procedure).  Placement of a drainage catheter (peritoneovenous shunt) to manage the extra fluid in the abdomen. Ascites may go away or improve when the condition that caused it is treated. Follow these instructions at home:  Keep track of your weight. To do this, weigh yourself at the same time every day and write down your weight.  Keep track of how much you drink and any changes in how much or how often you urinate.  Follow any instructions that your health care provider gives you about how much to drink.  Try not to eat salty (high-sodium) foods.  Take over-the-counter and prescription medicines only as told by your health care provider.  Keep all follow-up visits as told by your health care provider. This is important.  Report any changes in your health to your health care provider, especially if you develop new symptoms or your symptoms get worse. Contact a health care provider if:  You gain more than 3 lb (1.36 kg) in 3 days.  Your waist size increases.  You have new swelling in your legs.  The swelling in your legs gets worse. Get help right away if:  You have a fever.  You are confused.  You have new or worsening breathing trouble.  You have new or worsening pain in your abdomen.  You have new or worsening swelling in the scrotum (in men). Summary  Ascites is a collection of too much fluid in the abdomen.  Ascites may be caused by various conditions, such as cirrhosis, hepatitis, cancer, or congestive heart failure.  Symptoms may include swelling of the abdomen and other areas due to extra fluid in the body.  Treatments may involve dietary changes, medicines, or procedures. This information  is not intended to replace advice given to you by your health care provider. Make sure you discuss any questions you have with your health care provider. Document Revised: 07/29/2018 Document Reviewed: 05/09/2017 Elsevier  Patient Education  2020 [employer] p

## 2020-04-08 LAB — COMPLETE METABOLIC PANEL WITH GFR
AG Ratio: 1.5 (calc) (ref 1.0–2.5)
ALT: 15 U/L (ref 6–29)
AST: 13 U/L (ref 10–35)
Albumin: 4.2 g/dL (ref 3.6–5.1)
Alkaline phosphatase (APISO): 59 U/L (ref 31–125)
BUN: 13 mg/dL (ref 7–25)
CO2: 28 mmol/L (ref 20–32)
Calcium: 10.1 mg/dL (ref 8.6–10.2)
Chloride: 103 mmol/L (ref 98–110)
Creat: 0.72 mg/dL (ref 0.50–1.10)
GFR, Est African American: 114 mL/min/{1.73_m2} (ref >=60)
GFR, Est Non African American: 98 mL/min/{1.73_m2} (ref >=60)
Globulin: 2.8 g/dL (calc) (ref 1.9–3.7)
Glucose, Bld: 111 mg/dL — ABNORMAL HIGH (ref 65–99)
Potassium: 4.3 mmol/L (ref 3.5–5.3)
Sodium: 139 mmol/L (ref 135–146)
Total Bilirubin: 0.2 mg/dL (ref 0.2–1.2)
Total Protein: 7 g/dL (ref 6.1–8.1)

## 2020-04-08 LAB — CBC WITH DIFFERENTIAL/PLATELET
Absolute Monocytes: 567 cells/uL (ref 200–950)
Basophils Absolute: 19 cells/uL (ref 0–200)
Basophils Relative: 0.3 %
Eosinophils Absolute: 69 cells/uL (ref 15–500)
Eosinophils Relative: 1.1 %
HCT: 36.9 % (ref 35.0–45.0)
Hemoglobin: 12.9 g/dL (ref 11.7–15.5)
Lymphs Abs: 1928 cells/uL (ref 850–3900)
MCH: 31.6 pg (ref 27.0–33.0)
MCHC: 35 g/dL (ref 32.0–36.0)
MCV: 90.4 fL (ref 80.0–100.0)
MPV: 9.1 fL (ref 7.5–12.5)
Monocytes Relative: 9 %
Neutro Abs: 3717 cells/uL (ref 1500–7800)
Neutrophils Relative %: 59 %
Platelets: 395 10*3/uL (ref 140–400)
RBC: 4.08 10*6/uL (ref 3.80–5.10)
RDW: 12.6 % (ref 11.0–15.0)
Total Lymphocyte: 30.6 %
WBC: 6.3 10*3/uL (ref 3.8–10.8)

## 2020-04-08 LAB — HEPATITIS C ANTIBODY
Hepatitis C Ab: NONREACTIVE
SIGNAL TO CUT-OFF: 0.15 (ref <1.00)

## 2020-04-08 LAB — GAMMA GT: GGT: 40 U/L (ref 3–55)

## 2020-04-08 LAB — T4, FREE: Free T4: 1 ng/dL (ref 0.8–1.8)

## 2020-04-08 LAB — B12 AND FOLATE PANEL
Folate: 6.6 ng/mL
Vitamin B-12: 380 pg/mL (ref 200–1100)

## 2020-04-08 LAB — AMYLASE: Amylase: 42 U/L (ref 21–101)

## 2020-04-08 LAB — TEST AUTHORIZATION

## 2020-04-08 LAB — PROTIME-INR
INR: 0.9
Prothrombin Time: 9.5 s (ref 9.0–11.5)

## 2020-04-08 LAB — LIPASE: Lipase: 40 U/L (ref 7–60)

## 2020-04-08 LAB — TSH: TSH: 0.82 mIU/L

## 2020-04-12 ENCOUNTER — Ambulatory Visit (INDEPENDENT_AMBULATORY_CARE_PROVIDER_SITE_OTHER)
Admission: RE | Admit: 2020-04-12 | Discharge: 2020-04-12 | Disposition: A | Discharge status: Home / Self Care | Payer: 59 | Source: Ambulatory Visit | Attending: Family Medicine | Admitting: Family Medicine

## 2020-04-12 ENCOUNTER — Other Ambulatory Visit: Payer: Self-pay

## 2020-04-12 DIAGNOSIS — M25561 Pain in right knee: Secondary | ICD-10-CM

## 2020-04-20 ENCOUNTER — Telehealth: Payer: Self-pay | Admitting: Family Medicine

## 2020-04-20 ENCOUNTER — Other Ambulatory Visit: Payer: Self-pay

## 2020-04-20 ENCOUNTER — Ambulatory Visit (INDEPENDENT_AMBULATORY_CARE_PROVIDER_SITE_OTHER): Payer: 59 | Admitting: Family Medicine

## 2020-04-20 ENCOUNTER — Encounter: Payer: Self-pay | Admitting: Family Medicine

## 2020-04-20 VITALS — BP 110/80 | HR 106 | Temp 98.5°F | Wt 176.0 lb

## 2020-04-20 DIAGNOSIS — F3010 Manic episode without psychotic symptoms, unspecified: Secondary | ICD-10-CM | POA: Diagnosis not present

## 2020-04-20 NOTE — Telephone Encounter (Signed)
°  Spoke with pt advised that she does not need a referral to Psychiatrist, advised to call Dr Lorenda Ishihara office and schedule appointment, pt state that she will call to schedule appointment

## 2020-04-20 NOTE — Telephone Encounter (Signed)
Pt would like to have a referral to Dr. Thedore Mins (psychiatrist) [phone]  [place], [place] [phone] and/or Dr. Milagros Evener [facility] [address] [place], [place] [phone].

## 2020-04-21 ENCOUNTER — Telehealth: Payer: Self-pay | Admitting: Family Medicine

## 2020-04-21 NOTE — Telephone Encounter (Signed)
Pt called stating she was seen 8/11 by dr. Salomon Fick and was suppose to get a prescription for her face but it was never sent to the pharmacy.

## 2020-04-25 NOTE — Telephone Encounter (Signed)
Pt is calling to check on the med for her face. Pt also stated that she is still needing a medication for her knee   Pt would like a call back at [phone]    [facility] [address]), [place] - [address] Phone:  [phone]  Fax:  [phone]

## 2020-04-26 ENCOUNTER — Other Ambulatory Visit: Payer: Self-pay | Admitting: Family Medicine

## 2020-04-26 ENCOUNTER — Encounter: Payer: Self-pay | Admitting: Family Medicine

## 2020-04-26 MED ORDER — KETOCONAZOLE 2 % EX CREA: 1.0000 "application " | TOPICAL_CREAM | Freq: Every day | CUTANEOUS | 0 refills | Status: DC

## 2020-04-26 NOTE — Telephone Encounter (Signed)
Cream for face sent to pharmacy.  Rx for voltaren gel sent to pharmacy on 7/28? For pt's knee pain.  Pt did not discuss knee pain at last OFV.  Xray from July visit with mild arthritis.  Pt to f/u with Ortho for continued or worsened knee pain.

## 2020-04-26 NOTE — Progress Notes (Signed)
Subjective:    Patient ID: Kelly Patton, female    DOB: 1971/01/20, 49 y.o.   MRN: [id number]  No chief complaint on file.   HPI Pt scheduled for for f/u on knee pain, however inquires about a pill to stop bugs crawling in her body.  Endorses a cold sensation felt with hovering hand above head.  Pt states she is "not crazy" and is not on drugs.  Pt notes she relapsed a few times last yr, but denies recent drug use.  States she at times hears a voice judging her. The voice does not tell her to hurt herself or others.  Pt states if she doesn't wash her hands the voice may tell her she is "nasty or dirty".  Sometimes pt feels like other people's thoughts are judging her.  Pt denies SI/HI.  Pt also notes faces on tv at times seem distorted.  Pt reports drinking less EtOH.  Pt mentions trying to see Psychiatry in the past, but could not afford the deposit.  Pt did not sleep last night.  Past Medical History:  Diagnosis Date  . Anxiety   . Carpal tunnel syndrome, right   . Depression   . GERD (gastroesophageal reflux disease)   . Hypertension   . Shortness of breath dyspnea    with exertion uses inhaler    No Known Allergies  ROS General: Denies fever, chills, night sweats, changes in weight, changes in appetite +sensation of bugs in body HEENT: Denies headaches, ear pain, changes in vision, rhinorrhea, sore throat CV: Denies CP, palpitations, SOB, orthopnea Pulm: Denies SOB, cough, wheezing GI: Denies abdominal pain, nausea, vomiting, diarrhea, constipation GU: Denies dysuria, hematuria, frequency, vaginal discharge Msk: Denies muscle cramps, joint pains Neuro: Denies weakness, numbness, tingling Skin: Denies rashes, bruising Psych: Denies depression, anxiety +A/V hallucinations     Objective:    Blood pressure 110/80, pulse (!) 106, temperature 98.5 F (36.9 C), temperature source Oral, weight 176 lb (79.8 kg), last menstrual period 04/12/2020, SpO2 98 %.  Gen.  Pleasant, well-nourished, in no distress, normal affect  HEENT: [place]/AT, face symmetric, conjunctiva clear, no scleral icterus, PERRLA, EOMI, nares patent without drainage. Lungs: no accessory muscle use Cardiovascular: RRR, no peripheral edema Abdomen: enlarged, central obesity Musculoskeletal: No deformities, no cyanosis or clubbing, normal tone Neuro:  A&Ox3, CN II-XII intact, normal gait Skin:  Warm, no lesions/ rash Psych: mood elevated, pressured speech, flight of ideas  Wt Readings from Last 3 Encounters:  04/20/20 176 lb (79.8 kg)  04/07/20 180 lb (81.6 kg)  01/06/20 180 lb (81.6 kg)    Lab Results  Component Value Date   WBC 6.3 04/07/2020   HGB 12.9 04/07/2020   HCT 36.9 04/07/2020   PLT 395 04/07/2020   GLUCOSE 111 (H) 04/07/2020   CHOL 264 (H) 01/06/2020   TRIG (H) 01/06/2020    462.0 Triglyceride is over 400; calculations on Lipids are invalid.   HDL 54.60 01/06/2020   LDLDIRECT 120.0 01/06/2020   ALT 15 04/07/2020   AST 13 04/07/2020   NA 139 04/07/2020   K 4.3 04/07/2020   CL 103 04/07/2020   CREATININE 0.72 04/07/2020   BUN 13 04/07/2020   CO2 28 04/07/2020   TSH 0.82 04/07/2020   INR 0.9 04/07/2020   HGBA1C 5.5 01/06/2020    Assessment/Plan:  Manic behavior (HCC)  -A/V hallucinations, elevated mood, decreased sleep,etc -concern for underlying/undiagnosed d/o starting many yrs ago. -Pt encouraged to re-establish/establish with Psychiatry.  Given list of [facility]  providers.  Advised to call. -pt encouraged to refrain from substance use -given strict precautions.  For continued of worsening symptoms pt advised to go to the ED. -will continue to monitor  F/u in the next wk  Abbe Amsterdam, MD

## 2020-04-27 NOTE — Telephone Encounter (Signed)
Tried calling patient, unable to leave message mailbox not set up.

## 2020-04-28 NOTE — Telephone Encounter (Signed)
Spoke with pt verbalized understanding that Dr Salomon Fick sent Rx for face cream to pt pharmacy

## 2020-07-12 ENCOUNTER — Other Ambulatory Visit: Payer: Self-pay

## 2020-07-12 ENCOUNTER — Emergency Department (HOSPITAL_COMMUNITY)
Admission: EM | Admit: 2020-07-12 | Discharge: 2020-07-12 | Disposition: A | Discharge status: Home / Self Care | Payer: 59 | Attending: Emergency Medicine | Admitting: Emergency Medicine

## 2020-07-12 DIAGNOSIS — Z5321 Procedure and treatment not carried out due to patient leaving prior to being seen by health care provider: Secondary | ICD-10-CM | POA: Diagnosis not present

## 2020-07-12 DIAGNOSIS — R519 Headache, unspecified: Secondary | ICD-10-CM | POA: Insufficient documentation

## 2020-07-12 DIAGNOSIS — M791 Myalgia, unspecified site: Secondary | ICD-10-CM | POA: Diagnosis not present

## 2020-07-12 LAB — COMPREHENSIVE METABOLIC PANEL
ALT: 25 U/L (ref 0–44)
AST: 23 U/L (ref 15–41)
Albumin: 4.1 g/dL (ref 3.5–5.0)
Alkaline Phosphatase: 53 U/L (ref 38–126)
Anion gap: 10 (ref 5–15)
BUN: 18 mg/dL (ref 6–20)
CO2: 26 mmol/L (ref 22–32)
Calcium: 10.3 mg/dL (ref 8.9–10.3)
Chloride: 100 mmol/L (ref 98–111)
Creatinine, Ser: 0.67 mg/dL (ref 0.44–1.00)
GFR, Estimated: >60 mL/min (ref >60)
Glucose, Bld: 106 mg/dL — ABNORMAL HIGH (ref 70–99)
Potassium: 3.4 mmol/L — ABNORMAL LOW (ref 3.5–5.1)
Sodium: 136 mmol/L (ref 135–145)
Total Bilirubin: 0.7 mg/dL (ref 0.3–1.2)
Total Protein: 7.5 g/dL (ref 6.5–8.1)

## 2020-07-12 LAB — CBC WITH DIFFERENTIAL/PLATELET
Abs Immature Granulocytes: 0.05 10*3/uL (ref 0.00–0.07)
Basophils Absolute: 0 10*3/uL (ref 0.0–0.1)
Basophils Relative: 0 %
Eosinophils Absolute: 0.1 10*3/uL (ref 0.0–0.5)
Eosinophils Relative: 1 %
HCT: 38.9 % (ref 36.0–46.0)
Hemoglobin: 12.8 g/dL (ref 12.0–15.0)
Immature Granulocytes: 1 %
Lymphocytes Relative: 36 %
Lymphs Abs: 2.2 10*3/uL (ref 0.7–4.0)
MCH: 29.9 pg (ref 26.0–34.0)
MCHC: 32.9 g/dL (ref 30.0–36.0)
MCV: 90.9 fL (ref 80.0–100.0)
Monocytes Absolute: 0.5 10*3/uL (ref 0.1–1.0)
Monocytes Relative: 9 %
Neutro Abs: 3.2 10*3/uL (ref 1.7–7.7)
Neutrophils Relative %: 53 %
Platelets: 413 10*3/uL — ABNORMAL HIGH (ref 150–400)
RBC: 4.28 MIL/uL (ref 3.87–5.11)
RDW: 12.3 % (ref 11.5–15.5)
WBC: 6.1 10*3/uL (ref 4.0–10.5)
nRBC: 0 % (ref 0.0–0.2)

## 2020-07-12 NOTE — ED Triage Notes (Addendum)
Pt arrives POV with complaints of headache and body aches X1 week. Pt 99.0 temp. Pt alert and oriented X4. Denies N/V. Pt vaccinated against covid

## 2020-07-12 NOTE — ED Notes (Signed)
Pt stated she was leaving and was going to return in the morning for a shorter wait time. This NT informed pt that wait time may not be shorter and that she would have to start the process all over again. Pt verbalized understanding and stated she was leaving.

## 2020-07-13 ENCOUNTER — Telehealth (INDEPENDENT_AMBULATORY_CARE_PROVIDER_SITE_OTHER): Payer: 59 | Admitting: Family Medicine

## 2020-07-13 ENCOUNTER — Encounter: Payer: Self-pay | Admitting: Family Medicine

## 2020-07-13 DIAGNOSIS — Z8616 Personal history of COVID-19: Secondary | ICD-10-CM | POA: Diagnosis not present

## 2020-07-13 DIAGNOSIS — I10 Essential (primary) hypertension: Secondary | ICD-10-CM | POA: Diagnosis not present

## 2020-07-13 DIAGNOSIS — E876 Hypokalemia: Secondary | ICD-10-CM

## 2020-07-13 DIAGNOSIS — M1990 Unspecified osteoarthritis, unspecified site: Secondary | ICD-10-CM

## 2020-07-13 DIAGNOSIS — M5442 Lumbago with sciatica, left side: Secondary | ICD-10-CM | POA: Diagnosis not present

## 2020-07-13 MED ORDER — CHLORTHALIDONE 25 MG PO TABS: 25.0000 mg | ORAL_TABLET | Freq: Every day | ORAL | 1 refills | Status: DC

## 2020-07-13 MED ORDER — MELOXICAM 15 MG PO TABS: 15.0000 mg | ORAL_TABLET | Freq: Every day | ORAL | 0 refills | Status: DC

## 2020-07-13 NOTE — Progress Notes (Signed)
Virtual Visit via Telephone Note  I connected with Kelly Patton on 07/13/20 at  4:30 PM EDT by telephone and verified that I am speaking with the correct person using two identifiers.   I discussed the limitations, risks, security and privacy concerns of performing an evaluation and management service by telephone and the availability of in person appointments. I also discussed with the patient that there may be a patient responsible charge related to this service. The patient expressed understanding and agreed to proceed.  Location patient: home Location provider: work or home office Participants present for the call: patient, provider Patient did not have a visit in the prior 7 days to address this/these issue(s).   History of Present Illness: Pt had COVID-19 Sept 10th, 2021.  Having HAs and body aches (neck and back) x 3 wks.  Taking a lot of Aleeve and tylenol.  Pt has a slight HA now.  May wake up with a HA, hurts all over, a tension like feeling.  Pt denies photophobia or sensitivity.  Drinking lots of water.  Denies increased stress.  Drinking less EtOH, a 5th may last a wk.  Taking gummy MVI which helps with her cough.  Pt smoking less MJ.    Pt had Moderna COVID vaccines, but they were 3 months apart.  Pt does not recall the date.     Tingling, pain, numbness in L hand similar to when she had her R wrist carpal tunnel issues.  Pt requesting something for arthritis pain.  States did not pick up rx that was previously sent.  Pt has "an issue with creams" that have an odor.  Observations/Objective: Patient sounds cheerful and well on the phone. I do not appreciate any SOB. Speech and thought processing are grossly intact. Patient reported vitals:  Assessment and Plan: History of 2019 novel coronavirus disease (COVID-19) -recovered from infection Sept 10th, 2021 -pt vaccinated -consider using peppermint oil on temples and smelling the oil for HAs  Essential  hypertension  -lifestyle modifications encouraged -will start Kdu 20 mEq daily as K+ was 3.4 on 07/12/20 from ED visit.  Pt left without being seen. - Plan: chlorthalidone (HYGROTON) 25 MG tablet, Kdur 20 mEq  Acute bilateral low back pain with left-sided sciatica -discussed supportive care -rx for mobic sent to pharmacy  Arthritis -discussed using biofreeze as needed  - Plan: meloxicam (MOBIC) 15 MG tablet  Hypokalemia -likely 2/2 HCTZ use.  -noted on labs from 07/12/20 from ED.  Pt left without being seen -K+ 3.4 -will start kdur 20 mEq daily.  Follow Up Instructions: F/u prn  I did not refer this patient for an OV in the next 24 hours for this/these issue(s).  I discussed the assessment and treatment plan with the patient. The patient was provided an opportunity to ask questions and all were answered. The patient agreed with the plan and demonstrated an understanding of the instructions.   The patient was advised to call back or seek an in-person evaluation if the symptoms worsen or if the condition fails to improve as anticipated.  I provided 14 minutes of non-face-to-face time during this encounter.   [name], MD

## 2020-07-18 ENCOUNTER — Ambulatory Visit: Payer: 59 | Admitting: Family Medicine

## 2020-07-19 ENCOUNTER — Encounter: Payer: Self-pay | Admitting: Family Medicine

## 2020-07-19 DIAGNOSIS — Z8616 Personal history of COVID-19: Secondary | ICD-10-CM

## 2020-07-19 DIAGNOSIS — M1990 Unspecified osteoarthritis, unspecified site: Secondary | ICD-10-CM | POA: Insufficient documentation

## 2020-07-19 HISTORY — DX: Personal history of COVID-19: Z86.16

## 2020-07-19 MED ORDER — POTASSIUM CHLORIDE CRYS ER 20 MEQ PO TBCR: 20.0000 meq | EXTENDED_RELEASE_TABLET | Freq: Every day | ORAL | 0 refills | Status: DC

## 2020-07-25 ENCOUNTER — Ambulatory Visit: Payer: 59 | Admitting: Family Medicine

## 2020-07-27 ENCOUNTER — Other Ambulatory Visit: Payer: Self-pay

## 2020-07-27 ENCOUNTER — Encounter: Payer: Self-pay | Admitting: Family Medicine

## 2020-07-27 ENCOUNTER — Ambulatory Visit (INDEPENDENT_AMBULATORY_CARE_PROVIDER_SITE_OTHER): Payer: 59 | Admitting: Family Medicine

## 2020-07-27 VITALS — BP 116/78 | HR 81 | Temp 98.7°F | Wt 171.0 lb

## 2020-07-27 DIAGNOSIS — F419 Anxiety disorder, unspecified: Secondary | ICD-10-CM | POA: Diagnosis not present

## 2020-07-27 DIAGNOSIS — G5602 Carpal tunnel syndrome, left upper limb: Secondary | ICD-10-CM | POA: Diagnosis not present

## 2020-07-27 DIAGNOSIS — M79671 Pain in right foot: Secondary | ICD-10-CM

## 2020-07-27 DIAGNOSIS — L853 Xerosis cutis: Secondary | ICD-10-CM | POA: Diagnosis not present

## 2020-07-27 DIAGNOSIS — M79672 Pain in left foot: Secondary | ICD-10-CM

## 2020-07-27 DIAGNOSIS — M25512 Pain in left shoulder: Secondary | ICD-10-CM

## 2020-07-27 NOTE — Progress Notes (Signed)
Subjective:    Patient ID: Kelly Patton, female    DOB: 05/04/1971, 49 y.o.   MRN: [id number]  No chief complaint on file.   HPI Patient was seen today for follow-up.  Patient endorses bilateral foot pain pt standing at least 12 hours shifts at work for a [employer].  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  Pt wearing older sneakers during the day.  Pain is on top of foot, more at the end of the day.  Pt with tingling and numbness in left hand.  Also endorses left thumb pain.  Worse with playing games on her phone.  Patient has a history of carpal tunnel release and right wrist.  States symptoms feel similar.  Has intermittent L shoulder pain.  Patient notes increased anxiety and feeling paranoia.  Patient inquires about list of [facility] providers.  Previously given a list but lost it.  Past Medical History:  Diagnosis Date  . Anxiety   . Carpal tunnel syndrome, right   . Depression   . GERD (gastroesophageal reflux disease)   . Hypertension   . Shortness of breath dyspnea    with exertion uses inhaler    No Known Allergies  ROS General: Denies fever, chills, night sweats, changes in weight, changes in appetite HEENT: Denies headaches, ear pain, changes in vision, rhinorrhea, sore throat CV: Denies CP, palpitations, SOB, orthopnea Pulm: Denies SOB, cough, wheezing GI: Denies abdominal pain, nausea, vomiting, diarrhea, constipation GU: Denies dysuria, hematuria, frequency, vaginal discharge Msk: Denies muscle cramps, joint pains  + bilateral foot pain, L shouler pain Neuro: Denies weakness+ numbness, tingling in left hand Skin: Denies rashes, bruising Psych: Denies depression, hallucinations  + anxiety, paranoia    Objective:    Blood pressure 116/78, pulse 81, temperature 98.7 F (37.1 C), temperature source Oral, weight 171 lb (77.6 kg), SpO2 99 %.  Gen. Pleasant, well-nourished, in no distress, normal affect   HEENT: [place]/AT, face symmetric, conjunctiva clear,  no scleral icterus, PERRLA, EOMI, nares patent without drainage, pharynx without erythema or exudate. Lungs: no accessory muscle use, CTAB, no wheezes or rales Cardiovascular: RRR, no m/r/g, no peripheral edema Musculoskeletal: No deformities, no cyanosis or clubbing, normal tone Neuro:  A&Ox3, CN II-XII intact, normal gait Skin:  Warm, no lesions/ rash   Wt Readings from Last 3 Encounters:  07/12/20 172 lb (78 kg)  04/20/20 176 lb (79.8 kg)  04/07/20 180 lb (81.6 kg)    Lab Results  Component Value Date   WBC 6.1 07/12/2020   HGB 12.8 07/12/2020   HCT 38.9 07/12/2020   PLT 413 (H) 07/12/2020   GLUCOSE 106 (H) 07/12/2020   CHOL 264 (H) 01/06/2020   TRIG (H) 01/06/2020    462.0 Triglyceride is over 400; calculations on Lipids are invalid.   HDL 54.60 01/06/2020   LDLDIRECT 120.0 01/06/2020   ALT 25 07/12/2020   AST 23 07/12/2020   NA 136 07/12/2020   K 3.4 (L) 07/12/2020   CL 100 07/12/2020   CREATININE 0.67 07/12/2020   BUN 18 07/12/2020   CO2 26 07/12/2020   TSH 0.82 04/07/2020   INR 0.9 04/07/2020   HGBA1C 5.5 01/06/2020    Assessment/Plan:  Pain in both feet -discussed supportive care including shoes with good supportive insoles and nonskid soles for work -NSAIDs prn -Given handout. -consider f/u with Podiatry for continued or worsened symptoms  Dry skin -discussed using a good moisturizer, avoid hot showers, staying hydration, etc. -continue to monitor  Anxiety -PHQ  9 score 8 -GAD 7 score 16 -given h/o paranoia and elevations in mood, discussed f/u with Psychiatry.  Pt interested in making an appt. -given info on area [facility] providers. -given precautions for continued or worsened symptoms  Carpal tunnel syndrome of left wrist  -supportive care including wrist splint at night, ice, rest, NSAIDs, etc -h/o R carpal tunnel release -given handout - Plan: Ambulatory referral to Orthopedic Surgery  Left shoulder pain, unspecified chronicity  -continue  supportive care -consider PT - Plan: Ambulatory referral to Orthopedic Surgery  F/u prn  Abbe Amsterdam, MD

## 2020-07-27 NOTE — Patient Instructions (Signed)
Foot Pain Many things can cause foot pain. Some common causes are:  An injury.  A sprain.  Arthritis.  Blisters.  Bunions. Follow these instructions at home: Managing pain, stiffness, and swelling If directed, put ice on the painful area:  Put ice in a plastic bag.  Place a towel between your skin and the bag.  Leave the ice on for 20 minutes, 2-3 times a day.  Activity  Do not stand or walk for long periods.  Return to your normal activities as told by your health care provider. Ask your health care provider what activities are safe for you.  Do stretches to relieve foot pain and stiffness as told by your health care provider.  Do not lift anything that is heavier than 10 lb (4.5 kg), or the limit that you are told, until your health care provider says that it is safe. Lifting a lot of weight can put added pressure on your feet. Lifestyle  Wear comfortable, supportive shoes that fit you well. Do not wear high heels.  Keep your feet clean and dry. General instructions  Take over-the-counter and prescription medicines only as told by your health care provider.  Rub your foot gently.  Pay attention to any changes in your symptoms.  Keep all follow-up visits as told by your health care provider. This is important. Contact a health care provider if:  Your pain does not get better after a few days of self-care.  Your pain gets worse.  You cannot stand on your foot. Get help right away if:  Your foot is numb or tingling.  Your foot or toes are swollen.  Your foot or toes turn white or blue.  You have warmth and redness along your foot. Summary  Common causes of foot pain are injury, sprain, arthritis, blisters or bunions.  Ice, medicines, and comfortable shoes may help foot pain.  Contact your health care provider if your pain does not get better after a few days of self-care. This information is not intended to replace advice given to you by your health  care provider. Make sure you discuss any questions you have with your health care provider. Document Revised: 06/12/2018 Document Reviewed: 06/12/2018 Elsevier Patient Education  2020 Elsevier Inc.  Managing Anxiety, Adult After being diagnosed with an anxiety disorder, you may be relieved to know why you have felt or behaved a certain way. You may also feel overwhelmed about the treatment ahead and what it will mean for your life. With care and support, you can manage this condition and recover from it. How to manage lifestyle changes Managing stress and anxiety  Stress is your body's reaction to life changes and events, both good and bad. Most stress will last just a few hours, but stress can be ongoing and can lead to more than just stress. Although stress can play a major role in anxiety, it is not the same as anxiety. Stress is usually caused by something external, such as a deadline, test, or competition. Stress normally passes after the triggering event has ended.  Anxiety is caused by something internal, such as imagining a terrible outcome or worrying that something will go wrong that will devastate you. Anxiety often does not go away even after the triggering event is over, and it can become long-term (chronic) worry. It is important to understand the differences between stress and anxiety and to manage your stress effectively so that it does not lead to an anxious response. Talk with your health  care provider or a counselor to learn more about reducing anxiety and stress. He or she may suggest tension reduction techniques, such as:  Music therapy. This can include creating or listening to music that you enjoy and that inspires you.  Mindfulness-based meditation. This involves being aware of your normal breaths while not trying to control your breathing. It can be done while sitting or walking.  Centering prayer. This involves focusing on a word, phrase, or sacred image that means  something to you and brings you peace.  Deep breathing. To do this, expand your stomach and inhale slowly through your nose. Hold your breath for 3-5 seconds. Then exhale slowly, letting your stomach muscles relax.  Self-talk. This involves identifying thought patterns that lead to anxiety reactions and changing those patterns.  Muscle relaxation. This involves tensing muscles and then relaxing them. Choose a tension reduction technique that suits your lifestyle and personality. These techniques take time and practice. Set aside 5-15 minutes a day to do them. Therapists can offer counseling and training in these techniques. The training to help with anxiety may be covered by some insurance plans. Other things you can do to manage stress and anxiety include:  Keeping a stress/anxiety diary. This can help you learn what triggers your reaction and then learn ways to manage your response.  Thinking about how you react to certain situations. You may not be able to control everything, but you can control your response.  Making time for activities that help you relax and not feeling guilty about spending your time in this way.  Visual imagery and yoga can help you stay calm and relax.  Medicines Medicines can help ease symptoms. Medicines for anxiety include:  Anti-anxiety drugs.  Antidepressants. Medicines are often used as a primary treatment for anxiety disorder. Medicines will be prescribed by a health care provider. When used together, medicines, psychotherapy, and tension reduction techniques may be the most effective treatment. Relationships Relationships can play a big part in helping you recover. Try to spend more time connecting with trusted friends and family members. Consider going to couples counseling, taking family education classes, or going to family therapy. Therapy can help you and others better understand your condition. How to recognize changes in your anxiety Everyone  responds differently to treatment for anxiety. Recovery from anxiety happens when symptoms decrease and stop interfering with your daily activities at home or work. This may mean that you will start to:  Have better concentration and focus. Worry will interfere less in your daily thinking.  Sleep better.  Be less irritable.  Have more energy.  Have improved memory. It is important to recognize when your condition is getting worse. Contact your health care provider if your symptoms interfere with home or work and you feel like your condition is not improving. Follow these instructions at home: Activity  Exercise. Most adults should do the following: ? Exercise for at least 150 minutes each week. The exercise should increase your heart rate and make you sweat (moderate-intensity exercise). ? Strengthening exercises at least twice a week.  Get the right amount and quality of sleep. Most adults need 7-9 hours of sleep each night. Lifestyle   Eat a healthy diet that includes plenty of vegetables, fruits, whole grains, low-fat dairy products, and lean protein. Do not eat a lot of foods that are high in solid fats, added sugars, or salt.  Make choices that simplify your life.  Do not use any products that contain nicotine or  tobacco, such as cigarettes, e-cigarettes, and chewing tobacco. If you need help quitting, ask your health care provider.  Avoid caffeine, alcohol, and certain over-the-counter cold medicines. These may make you feel worse. Ask your pharmacist which medicines to avoid. General instructions  Take over-the-counter and prescription medicines only as told by your health care provider.  Keep all follow-up visits as told by your health care provider. This is important. Where to find support You can get help and support from these sources:  Self-help groups.  Online and [employer].  A trusted spiritual leader.  Couples counseling.  Family education  classes.  Family therapy. Where to find more information You may find that joining a support group helps you deal with your anxiety. The following sources can help you locate counselors or support groups near you:  Mental Health America: www.mentalhealthamerica.net  Anxiety and Depression Association of [place] (ADAA): ProgramCam.de  The [employer] on Mental Illness (NAMI): www.nami.org Contact a health care provider if you:  Have a hard time staying focused or finishing daily tasks.  Spend many hours a day feeling worried about everyday life.  Become exhausted by worry.  Start to have headaches, feel tense, or have nausea.  Urinate more than normal.  Have diarrhea. Get help right away if you have:  A racing heart and shortness of breath.  Thoughts of hurting yourself or others. If you ever feel like you may hurt yourself or others, or have thoughts about taking your own life, get help right away. You can go to your nearest emergency department or call:  Your local emergency services (911 in the [place]  A suicide crisis helpline, such as the National Suicide Prevention Lifeline at 952-836-0074. This is open 24 hours a day. Summary  Taking steps to learn and use tension reduction techniques can help calm you and help prevent triggering an anxiety reaction.  When used together, medicines, psychotherapy, and tension reduction techniques may be the most effective treatment.  Family, friends, and partners can play a big part in helping you recover from an anxiety disorder. This information is not intended to replace advice given to you by your health care provider. Make sure you discuss any questions you have with your health care provider. Document Revised: 01/27/2019 Document Reviewed: 01/27/2019 Elsevier Patient Education  2020 Elsevier Inc.  Preventing Carpal Tunnel Syndrome  Carpal tunnel syndrome is a condition that causes pain, numbness, and weakness in the  wrist, hand, and fingers. The carpal tunnel is a narrow, hollow space in the wrist. Tendons and one of the main nerves in the hand (median nerve) pass through the carpal tunnel. The median nerve supplies feeling to the thumb and the first three fingers. It also supplies the muscles at the base of the thumb. Carpal tunnel syndrome happens when the median nerve gets squeezed in the area where it passes through the carpal tunnel. In some cases, it may not be possible to prevent carpal tunnel syndrome. However, you can take steps to relieve pressure on your wrist and reduce your risk of developing this condition. How can this condition affect me? Carpal tunnel syndrome can affect your ability to do jobs or activities that involve hand, wrist, and finger action. It can cause symptoms such as:  Pain in the wrist, hand, and fingers.  Burning, tingling, or numbness in the affected area.  A weak feeling in your hands. You may have trouble grabbing and holding items. Symptoms may get worse over time. For some people, symptoms get  worse at night. What can increase my risk? The following factors may make you more likely to develop this condition:  Having a job that requires you to repeatedly move your wrist or requires you to use tools that vibrate. This may include jobs that involve using computers, working on an [employer]assembly line, or working with power tools such as Radiographer, therapeuticdrills or sanders.  Being a woman.  Having a family history of the condition.  Having certain conditions, such as: ? Diabetes. ? Pregnancy. ? Obesity. ? Thyroid disease. ? Rheumatoid arthritis. What actions can I take to help prevent this condition?      Avoid making repetitive hand and wrist motions that cause your wrist to get stiff or painful.  Take frequent breaks if you use your hands and wrists for many hours at a time.  Stretch your hands and fingers often to get blood flowing and relieve tension.  Keep your wrists in the  natural position when using a computer keyboard or mouse. Do not bend your wrists downward or sideways.  If you use your hands and wrists for many hours at work, make changes to your work space to ease pressure on your wrists. You may want to use: ? A padded wrist rest for computer work. ? A slanted computer keyboard. ? Hand tools with padded handles to reduce vibrations.  Consider wearing a wrist brace. This will not prevent carpal tunnel syndrome but may keep it from getting worse. A wrist brace reduces bending and stress.  Closely manage any medical conditions you have that can put you at risk for carpal tunnel syndrome. Have your blood sugar checked to make sure you are not developing diabetes. If you have diabetes, work with your health care provider to keep your blood sugar under control. Where to find more information  [employer] Institute of Neurological Disorders and Stroke: BasicFM.noninds.nih.gov  American Academy of Family Physicians: Hydrologistfamilydoctor.org Contact a health care provider if:  You have numbness or tingling in your wrist, hand, or fingers.  You have pain or a burning sensation in your wrist, hand, or fingers.  Pain, tingling, or burning wakes you up at night.  Your hand becomes weak and clumsy.  You frequently drop objects.  You are unable to use your wrists and hands without pain. Summary  Carpal tunnel syndrome is a condition that causes pain, numbness, and weakness in the wrist, hand, and fingers.  You can take steps to relieve pressure on your wrist and reduce your risk of developing this condition.  Avoid making repetitive hand and wrist motions that cause your wrist to get stiff or painful.  If you use your hands and wrists for many hours at work, you may want to make changes to your work space to ease pressure on your wrists.  Take frequent breaks to stretch your hands and fingers. This information is not intended to replace advice given to you by your health  care provider. Make sure you discuss any questions you have with your health care provider. Document Revised: 01/09/2018 Document Reviewed: 01/09/2018 Elsevier Patient Education  2020 [employer]

## 2020-08-05 ENCOUNTER — Encounter: Payer: Self-pay | Admitting: Family Medicine

## 2020-08-10 ENCOUNTER — Ambulatory Visit (INDEPENDENT_AMBULATORY_CARE_PROVIDER_SITE_OTHER): Payer: 59 | Admitting: Orthopaedic Surgery

## 2020-08-10 ENCOUNTER — Other Ambulatory Visit: Payer: Self-pay

## 2020-08-10 ENCOUNTER — Ambulatory Visit (INDEPENDENT_AMBULATORY_CARE_PROVIDER_SITE_OTHER): Payer: 59

## 2020-08-10 DIAGNOSIS — M1812 Unilateral primary osteoarthritis of first carpometacarpal joint, left hand: Secondary | ICD-10-CM

## 2020-08-10 DIAGNOSIS — G5602 Carpal tunnel syndrome, left upper limb: Secondary | ICD-10-CM | POA: Diagnosis not present

## 2020-08-10 MED ORDER — GABAPENTIN 100 MG PO CAPS: 100.0000 mg | ORAL_CAPSULE | Freq: Three times a day (TID) | ORAL | 3 refills | Status: DC

## 2020-08-10 NOTE — Progress Notes (Signed)
Office Visit Note   Patient: Kelly Patton           Date of Birth: [birth year]           MRN: [id number] Visit Date: 08/10/2020              Requested by: [name], MD [address] [place],  [place] [address] PCP: [name], MD   Assessment & Plan: Visit Diagnoses:  1. Left carpal tunnel syndrome   2. Primary osteoarthritis of first carpometacarpal joint of left hand     Plan: Impression is left carpal tunnel syndrome and left basal joint arthritis.  Carpal tunnel brace provided today to wear at nighttime.  Gabapentin prescribed.  Recommend nerve conduction studies to assess the severity of carpal tunnel syndrome.  Voltaren gel was recommended to use for the basal joint arthritis.  Follow-up after the nerve conduction studies.  Follow-Up Instructions: Return if symptoms worsen or fail to improve.   Orders:  Orders Placed This Encounter  Procedures  . XR Hand Complete Left  . Ambulatory referral to Physical Medicine Rehab   Meds ordered this encounter  Medications  . gabapentin (NEURONTIN) 100 MG capsule    Sig: Take 1 capsule (100 mg total) by mouth 3 (three) times daily.    Dispense:  30 capsule    Refill:  3      Procedures: No procedures performed   Clinical Data: No additional findings.   Subjective: Chief Complaint  Patient presents with  . Left Hand - Pain    [name] is a 49 year old female comes in for evaluation of left wrist and hand pain and numbness and tingling for a few months.  Denies any injuries.  Meloxicam does not help.  Takes Tylenol and Aleve as needed.  She feels like the left hand falls asleep especially when he is she is playing video games.  She also reports dropping things out of her hand and the hand falling asleep when she is holding her phone.  She has some mild pain at the base of the thumb with grasping and holding things.   Review of Systems  Constitutional: Negative.   HENT: Negative.   Eyes:  Negative.   Respiratory: Negative.   Cardiovascular: Negative.   Endocrine: Negative.   Musculoskeletal: Negative.   Neurological: Negative.   Hematological: Negative.   Psychiatric/Behavioral: Negative.   All other systems reviewed and are negative.    Objective: Vital Signs: There were no vitals taken for this visit.  Physical Exam Vitals and nursing note reviewed.  Constitutional:      Appearance: She is well-developed.  Pulmonary:     Effort: Pulmonary effort is normal.  Skin:    General: Skin is warm.     Capillary Refill: Capillary refill takes less than 2 seconds.  Neurological:     Mental Status: She is alert and oriented to person, place, and time.  Psychiatric:        Behavior: Behavior normal.        Thought Content: Thought content normal.        Judgment: Judgment normal.     Ortho Exam Left hand shows no muscle atrophy.  Positive carpal tunnel compressive signs.  Mildly positive grind test.  No triggering. Specialty Comments:  No specialty comments available.  Imaging: XR Hand Complete Left  Result Date: 08/10/2020 Mild basal joint arthritis with [place] some spurring.    PMFS History: Patient Active Problem List   Diagnosis  Date Noted  . Left carpal tunnel syndrome 08/10/2020  . Primary osteoarthritis of first carpometacarpal joint of left hand 08/10/2020  . History of 2019 novel coronavirus disease (COVID-19) 07/19/2020  . Arthritis 07/19/2020  . Lumbar back pain 11/11/2019  . Trigger thumb, right thumb 12/09/2017  . Essential hypertension 07/17/2017  . Primary insomnia 07/17/2017  . Thumb pain, right 04/08/2017   Past Medical History:  Diagnosis Date  . Anxiety   . Carpal tunnel syndrome, right   . Depression   . GERD (gastroesophageal reflux disease)   . Hypertension   . Shortness of breath dyspnea    with exertion uses inhaler    Family History  Problem Relation Age of Onset  . Hypertension Father   . Hypertension Other     Past  Surgical History:  Procedure Laterality Date  . CARPAL TUNNEL RELEASE Right 01/31/2016   Procedure: CARPAL TUNNEL RELEASE;  Surgeon: Cammy Copa, MD;  Location: [facility] OR;  Service: Orthopedics;  Laterality: Right;  . HAND TENDON SURGERY     right  . TRIGGER FINGER RELEASE Right 11/26/2017   Procedure: RELEASE TRIGGER FINGER RIGHT THUMB;  Surgeon: Cammy Copa, MD;  Location: [facility] OR;  Service: Orthopedics;  Laterality: Right;   Social History   Occupational History  . Not on file  Tobacco Use  . Smoking status: Current Some Day Smoker    Types: Cigarettes  . Smokeless tobacco: Never Used  . Tobacco comment: Smokes 1 cigarette/week for 2 years  Vaping Use  . Vaping Use: Never used  Substance and Sexual Activity  . Alcohol use: Yes    Comment: once a week; fifth of liquor  . Drug use: Yes    Frequency: 1.0 times per week    Types: Marijuana    Comment: daily,last 11/25/17  . Sexual activity: Yes    Birth control/protection: None

## 2020-09-16 ENCOUNTER — Encounter: Payer: Self-pay | Admitting: Physical Medicine and Rehabilitation

## 2020-09-16 ENCOUNTER — Other Ambulatory Visit: Payer: Self-pay

## 2020-09-16 ENCOUNTER — Ambulatory Visit (INDEPENDENT_AMBULATORY_CARE_PROVIDER_SITE_OTHER): Payer: 59 | Admitting: Physical Medicine and Rehabilitation

## 2020-09-16 DIAGNOSIS — R202 Paresthesia of skin: Secondary | ICD-10-CM

## 2020-09-16 NOTE — Progress Notes (Signed)
Left hand tingling; pain left thumb/ CMC. Worse lying on bed playing video games Right hand dominant No lotion per patient Numeric Pain Rating Scale and Functional Assessment Average Pain 8   In the last MONTH (on 0-10 scale) has pain interfered with the following?  1. General activity like being  able to carry out your everyday physical activities such as walking, climbing stairs, carrying groceries, or moving a chair?  Rating(5)

## 2020-09-20 NOTE — Procedures (Signed)
EMG & NCV Findings: Evaluation of the left median (across palm) sensory nerve showed no response (Palm) and prolonged distal peak latency (4.0 ms).  All remaining nerves (as indicated in the following tables) were within normal limits.    All examined muscles (as indicated in the following table) showed no evidence of electrical instability.    Impression: The above electrodiagnostic study is ABNORMAL and reveals evidence of a mild left median nerve entrapment at the wrist (carpal tunnel syndrome) affecting sensory components.  This represents an improvement since the last electrodiagnostic study in 2017  There is no significant electrodiagnostic evidence of any other focal nerve entrapment, brachial plexopathy or cervical radiculopathy.   Recommendations: 1.  Follow-up with referring physician. 2.  Continue current management of symptoms. 3.  Continue use of resting splint at night-time and as needed during the day.  Consider diagnostic and therapeutic injection.  ___________________________ Naaman Plummer FAAPMR Board Certified, American Board of Physical Medicine and Rehabilitation    Nerve Conduction Studies Anti Sensory Summary Table   Stim Site NR Peak (ms) Norm Peak (ms) P-T Amp (V) Norm P-T Amp Site1 Site2 Delta-P (ms) Dist (cm) Vel (m/s) Norm Vel (m/s)  Left Median Acr Palm Anti Sensory (2nd Digit)  32.1C  Wrist    *4.0 <3.6 19.9 >10 Wrist Palm  0.0    Palm *NR  <2.0          Left Radial Anti Sensory (Base 1st Digit)  32.4C  Wrist    2.1 <3.1 22.5  Wrist Base 1st Digit 2.1 0.0    Left Ulnar Anti Sensory (5th Digit)  32.6C  Wrist    3.2 <3.7 17.5 >15.0 Wrist 5th Digit 3.2 14.0 44 >38   Motor Summary Table   Stim Site NR Onset (ms) Norm Onset (ms) O-P Amp (mV) Norm O-P Amp Site1 Site2 Delta-0 (ms) Dist (cm) Vel (m/s) Norm Vel (m/s)  Left Median Motor (Abd Poll Brev)  32.7C  Wrist    4.0 <4.2 9.8 >5 Elbow Wrist 3.6 20.0 56 >50  Elbow    7.6  9.6         Left Ulnar Motor  (Abd Dig Min)  32.9C  Wrist    2.6 <4.2 8.4 >3 B Elbow Wrist 3.6 19.0 53 >53  B Elbow    6.2  4.8  A Elbow B Elbow 1.7 10.0 59 >53  A Elbow    7.9  7.8          EMG   Side Muscle Nerve Root Ins Act Fibs Psw Amp Dur Poly Recrt Int Dennie Bible Comment  Left Abd Poll Brev Median C8-T1 Nml Nml Nml Nml Nml 0 Nml Nml   Left 1stDorInt Ulnar C8-T1 Nml Nml Nml Nml Nml 0 Nml Nml   Left PronatorTeres Median C6-7 Nml Nml Nml Nml Nml 0 Nml Nml   Left Biceps Musculocut C5-6 Nml Nml Nml Nml Nml 0 Nml Nml   Left Deltoid Axillary C5-6 Nml Nml Nml Nml Nml 0 Nml Nml     Nerve Conduction Studies Anti Sensory Left/Right Comparison   Stim Site L Lat (ms) R Lat (ms) L-R Lat (ms) L Amp (V) R Amp (V) L-R Amp (%) Site1 Site2 L Vel (m/s) R Vel (m/s) L-R Vel (m/s)  Median Acr Palm Anti Sensory (2nd Digit)  32.1C  Wrist *4.0   19.9   Wrist Palm     Palm             Radial Anti Sensory (  Base 1st Digit)  32.4C  Wrist 2.1   22.5   Wrist Base 1st Digit     Ulnar Anti Sensory (5th Digit)  32.6C  Wrist 3.2   17.5   Wrist 5th Digit 44     Motor Left/Right Comparison   Stim Site L Lat (ms) R Lat (ms) L-R Lat (ms) L Amp (mV) R Amp (mV) L-R Amp (%) Site1 Site2 L Vel (m/s) R Vel (m/s) L-R Vel (m/s)  Median Motor (Abd Poll Brev)  32.7C  Wrist 4.0   9.8   Elbow Wrist 56    Elbow 7.6   9.6         Ulnar Motor (Abd Dig Min)  32.9C  Wrist 2.6   8.4   B Elbow Wrist 53    B Elbow 6.2   4.8   A Elbow B Elbow 59    A Elbow 7.9   7.8            Waveforms:

## 2020-09-20 NOTE — Progress Notes (Signed)
Kelly Patton - 50 y.o. female MRN [id number]  Date of birth: 1971-07-08  Office Visit Note: Visit Date: 09/16/2020 PCP: [name], MD Referred by: [name], MD  Subjective: Chief Complaint  Patient presents with  . Left Hand - Numbness, Pain   HPI:  Kelly Patton is a 50 y.o. female who comes in today at the request of Dr. Glee Arvin for electrodiagnostic study of the Left upper extremities.  Patient is Right hand dominant.  She describes chronic worsening pain numbness and tingling in the left hand particularly left thumb and first digit.  A lot of pain in the base of the left thumb and CMC joint.  It is worse with lying in bed and playing video games.  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  She subsequently underwent right median nerve or carpal tunnel release by Dr. Burnard Bunting in 2017.  She did not have the left hand done at the time.  She rates her pain as an 8 out of 10.   ROS Otherwise per HPI.  Assessment & Plan: Visit Diagnoses:    ICD-10-CM   1. Paresthesia of skin  R20.2 NCV with EMG (electromyography)    Plan: Impression: The above electrodiagnostic study is ABNORMAL and reveals evidence of a mild left median nerve entrapment at the wrist (carpal tunnel syndrome) affecting sensory components.  This represents an improvement since the last electrodiagnostic study in 2017  There is no significant electrodiagnostic evidence of any other focal nerve entrapment, brachial plexopathy or cervical radiculopathy.   Recommendations: 1.  Follow-up with referring physician. 2.  Continue current management of symptoms. 3.  Continue use of resting splint at night-time and as needed during the day.  Consider diagnostic and therapeutic injection.  Meds & Orders: No orders of the defined types were placed in this encounter.    Orders Placed This Encounter  Procedures  . NCV with EMG (electromyography)    Follow-up: Return in about 2 weeks (around 09/30/2020) for  Glee Arvin, MD.   Procedures: No procedures performed  EMG & NCV Findings: Evaluation of the left median (across palm) sensory nerve showed no response (Palm) and prolonged distal peak latency (4.0 ms).  All remaining nerves (as indicated in the following tables) were within normal limits.    All examined muscles (as indicated in the following table) showed no evidence of electrical instability.    Impression: The above electrodiagnostic study is ABNORMAL and reveals evidence of a mild left median nerve entrapment at the wrist (carpal tunnel syndrome) affecting sensory components.  This represents an improvement since the last electrodiagnostic study in 2017  There is no significant electrodiagnostic evidence of any other focal nerve entrapment, brachial plexopathy or cervical radiculopathy.   Recommendations: 1.  Follow-up with referring physician. 2.  Continue current management of symptoms. 3.  Continue use of resting splint at night-time and as needed during the day.  Consider diagnostic and therapeutic injection.  ___________________________ Naaman Plummer FAAPMR Board Certified, American Board of Physical Medicine and Rehabilitation    Nerve Conduction Studies Anti Sensory Summary Table   Stim Site NR Peak (ms) Norm Peak (ms) P-T Amp (V) Norm P-T Amp Site1 Site2 Delta-P (ms) Dist (cm) Vel (m/s) Norm Vel (m/s)  Left Median Acr Palm Anti Sensory (2nd Digit)  32.1C  Wrist    *4.0 <3.6 19.9 >10 Wrist  Palm  0.0    Palm *NR  <2.0          Left Radial Anti Sensory (Base 1st Digit)  32.4C  Wrist    2.1 <3.1 22.5  Wrist Base 1st Digit 2.1 0.0    Left Ulnar Anti Sensory (5th Digit)  32.6C  Wrist    3.2 <3.7 17.5 >15.0 Wrist 5th Digit 3.2 14.0 44 >38   Motor Summary Table   Stim Site NR Onset (ms) Norm Onset (ms) O-P Amp (mV) Norm O-P Amp  Site1 Site2 Delta-0 (ms) Dist (cm) Vel (m/s) Norm Vel (m/s)  Left Median Motor (Abd Poll Brev)  32.7C  Wrist    4.0 <4.2 9.8 >5 Elbow Wrist 3.6 20.0 56 >50  Elbow    7.6  9.6         Left Ulnar Motor (Abd Dig Min)  32.9C  Wrist    2.6 <4.2 8.4 >3 B Elbow Wrist 3.6 19.0 53 >53  B Elbow    6.2  4.8  A Elbow B Elbow 1.7 10.0 59 >53  A Elbow    7.9  7.8          EMG   Side Muscle Nerve Root Ins Act Fibs Psw Amp Dur Poly Recrt Int Dennie Bible Comment  Left Abd Poll Brev Median C8-T1 Nml Nml Nml Nml Nml 0 Nml Nml   Left 1stDorInt Ulnar C8-T1 Nml Nml Nml Nml Nml 0 Nml Nml   Left PronatorTeres Median C6-7 Nml Nml Nml Nml Nml 0 Nml Nml   Left Biceps Musculocut C5-6 Nml Nml Nml Nml Nml 0 Nml Nml   Left Deltoid Axillary C5-6 Nml Nml Nml Nml Nml 0 Nml Nml     Nerve Conduction Studies Anti Sensory Left/Right Comparison   Stim Site L Lat (ms) R Lat (ms) L-R Lat (ms) L Amp (V) R Amp (V) L-R Amp (%) Site1 Site2 L Vel (m/s) R Vel (m/s) L-R Vel (m/s)  Median Acr Palm Anti Sensory (2nd Digit)  32.1C  Wrist *4.0   19.9   Wrist Palm     Palm             Radial Anti Sensory (Base 1st Digit)  32.4C  Wrist 2.1   22.5   Wrist Base 1st Digit     Ulnar Anti Sensory (5th Digit)  32.6C  Wrist 3.2   17.5   Wrist 5th Digit 44     Motor Left/Right Comparison   Stim Site L Lat (ms) R Lat (ms) L-R Lat (ms) L Amp (mV) R Amp (mV) L-R Amp (%) Site1 Site2 L Vel (m/s) R Vel (m/s) L-R Vel (m/s)  Median Motor (Abd Poll Brev)  32.7C  Wrist 4.0   9.8   Elbow Wrist 56    Elbow 7.6   9.6         Ulnar Motor (Abd Dig Min)  32.9C  Wrist 2.6   8.4   B Elbow Wrist 53    B Elbow 6.2   4.8   A Elbow B Elbow 59    A Elbow 7.9   7.8            Waveforms:             Clinical History: 01/12/2016 Electrodiagnostic study Bilateral UE Impression: The above electrodiagnostic study is ABNORMAL and reveals evidence of a moderate BILATERAL median nerve entrapment at the wrist (carpal tunnel syndrome) affecting sensory and  motor components.   There is no  significant electrodiagnostic evidence of any other focal nerve entrapment, brachial plexopathy or cervical radiculopathy.   Recommendations: 1.  Follow-up with referring physician. 2.  Continue current management of symptoms. 3.  Continue use of resting splint at night-time and as needed during the day. 4.  Suggest surgical evaluation.     ___________________________ Johnnette Barrios. Alvester Morin, M.D. FAAPMR Board Certified, American Board of Physical Medicine and Rehabilitation     Objective:  VS:  HT:    WT:   BMI:     BP:   HR: bpm  TEMP: ( )  RESP:  Physical Exam Musculoskeletal:        General: No swelling, tenderness or deformity.     Comments: Inspection reveals no atrophy of the bilateral APB or FDI or hand intrinsics. There is no swelling, color changes, allodynia or dystrophic changes. There is 5 out of 5 strength in the bilateral wrist extension, finger abduction and long finger flexion. There is intact sensation to light touch in all dermatomal and peripheral nerve distributions.  There is a negative Hoffmann's test bilaterally.  Skin:    General: Skin is warm and dry.     Findings: No erythema or rash.  Neurological:     General: No focal deficit present.     Mental Status: She is alert and oriented to person, place, and time.     Motor: No weakness or abnormal muscle tone.     Coordination: Coordination normal.  Psychiatric:        Mood and Affect: Mood normal.        Behavior: Behavior normal.      Imaging: No results found.

## 2020-09-27 ENCOUNTER — Ambulatory Visit: Payer: 59 | Admitting: Orthopaedic Surgery

## 2020-10-11 ENCOUNTER — Other Ambulatory Visit: Payer: Self-pay

## 2020-10-11 ENCOUNTER — Encounter: Payer: Self-pay | Admitting: Orthopaedic Surgery

## 2020-10-11 ENCOUNTER — Ambulatory Visit (INDEPENDENT_AMBULATORY_CARE_PROVIDER_SITE_OTHER): Payer: Self-pay | Admitting: Orthopaedic Surgery

## 2020-10-11 DIAGNOSIS — G5602 Carpal tunnel syndrome, left upper limb: Secondary | ICD-10-CM

## 2020-10-11 MED ORDER — LIDOCAINE HCL 1 % IJ SOLN
1.0000 mL | INTRAMUSCULAR | Status: AC | PRN
  Administered 2020-10-11: 1 mL

## 2020-10-11 MED ORDER — METHYLPREDNISOLONE ACETATE 40 MG/ML IJ SUSP
40.0000 mg | INTRAMUSCULAR | Status: AC | PRN
  Administered 2020-10-11: 40 mg

## 2020-10-11 MED ORDER — BUPIVACAINE HCL 0.5 % IJ SOLN
1.0000 mL | INTRAMUSCULAR | Status: AC | PRN
  Administered 2020-10-11: 1 mL

## 2020-10-11 NOTE — Progress Notes (Signed)
Office Visit Note   Patient: Kelly Patton           Date of Birth: 07-12-1971           MRN: [id number] Visit Date: 10/11/2020              Requested by: [name], MD [address] [place],  [place] [address] PCP: [name], MD   Assessment & Plan: Visit Diagnoses:  1. Left carpal tunnel syndrome     Plan: Impression is left carpal tunnel syndrome mild in nature.  We have discussed cortisone injection for which she would like to proceed.  She also notes that she needs a new wrist splint for which we will provide today.  She will follow up with [place] as needed.  Follow-Up Instructions: Return if symptoms worsen or fail to improve.   Orders:  No orders of the defined types were placed in this encounter.  No orders of the defined types were placed in this encounter.     Procedures: Hand/UE Inj: L carpal tunnel for carpal tunnel syndrome on 10/11/2020 6:07 PM Details: 25 G needle Medications: 1 mL lidocaine 1 %; 1 mL bupivacaine 0.5 %; 40 mg methylPREDNISolone acetate 40 MG/ML Outcome: tolerated well, no immediate complications Patient was prepped and draped in the usual sterile fashion.       Clinical Data: No additional findings.   Subjective: Chief Complaint  Patient presents with  . Left Hand - Pain, Follow-up    HPI patient is a pleasant 50 year old female who comes in today to discuss nerve conduction studies of the left upper extremity.  She was seen by [place] recently for left hand paresthesias and was referred to Dr. Alvester Morin for nerve conduction study/EMG.  Results showed a mild left median nerve compression.  She continues to have symptoms which she thinks have recently worsened.  She is starting to notice weakness to the left hand and recently dropped a cardboard box.  She has not previously had a cortisone injection to the carpal tunnel.     Objective: Vital Signs: There were no vitals taken for this visit.    Ortho Exam  stable left wrist exam  Specialty Comments:  No specialty comments available.  Imaging: No new imaging   PMFS History: Patient Active Problem List   Diagnosis Date Noted  . Left carpal tunnel syndrome 08/10/2020  . Primary osteoarthritis of first carpometacarpal joint of left hand 08/10/2020  . History of 2019 novel coronavirus disease (COVID-19) 07/19/2020  . Arthritis 07/19/2020  . Lumbar back pain 11/11/2019  . Trigger thumb, right thumb 12/09/2017  . Essential hypertension 07/17/2017  . Primary insomnia 07/17/2017  . Thumb pain, right 04/08/2017   Past Medical History:  Diagnosis Date  . Anxiety   . Carpal tunnel syndrome, right   . Depression   . GERD (gastroesophageal reflux disease)   . Hypertension   . Shortness of breath dyspnea    with exertion uses inhaler    Family History  Problem Relation Age of Onset  . Hypertension Father   . Hypertension Other     Past Surgical History:  Procedure Laterality Date  . CARPAL TUNNEL RELEASE Right 01/31/2016   Procedure: CARPAL TUNNEL RELEASE;  Surgeon: Cammy Copa, MD;  Location: [facility] OR;  Service: Orthopedics;  Laterality: Right;  . HAND TENDON SURGERY     right  . TRIGGER FINGER RELEASE Right 11/26/2017   Procedure: RELEASE TRIGGER FINGER RIGHT THUMB;  Surgeon:  Cammy Copa, MD;  Location: [facility] OR;  Service: Orthopedics;  Laterality: Right;   Social History   Occupational History  . Not on file  Tobacco Use  . Smoking status: Current Some Day Smoker    Types: Cigarettes  . Smokeless tobacco: Never Used  . Tobacco comment: Smokes 1 cigarette/week for 2 years  Vaping Use  . Vaping Use: Never used  Substance and Sexual Activity  . Alcohol use: Yes    Comment: once a week; fifth of liquor  . Drug use: Yes    Frequency: 1.0 times per week    Types: Marijuana    Comment: daily,last 11/25/17  . Sexual activity: Yes    Birth control/protection: None

## 2020-10-12 ENCOUNTER — Telehealth: Payer: Self-pay | Admitting: Physician Assistant

## 2020-10-12 ENCOUNTER — Other Ambulatory Visit: Payer: Self-pay | Admitting: Physician Assistant

## 2020-10-12 ENCOUNTER — Telehealth: Payer: Self-pay

## 2020-10-12 MED ORDER — TRAMADOL HCL 50 MG PO TABS
50.0000 mg | ORAL_TABLET | Freq: Three times a day (TID) | ORAL | 0 refills | Status: DC | PRN
Start: 2020-10-12 — End: 2021-05-04

## 2020-10-12 NOTE — Telephone Encounter (Signed)
Pt informed

## 2020-10-12 NOTE — Telephone Encounter (Signed)
LVM for pt to call back to inform

## 2020-10-12 NOTE — Telephone Encounter (Signed)
Patient called requesting medication PA Dub Mikes to send tramadol. Patient is asking for medication to be sent to [facility] on [address] and [place]. Patient phone number is [phone].

## 2020-10-12 NOTE — Telephone Encounter (Signed)
Can have a rebound effect, unfortunately.  Give it two weeks after inj.  I called in tramadol earlier.  I can also call in prednisone if she would like

## 2020-10-12 NOTE — Telephone Encounter (Signed)
Sent in

## 2020-10-12 NOTE — Telephone Encounter (Signed)
Patient called stating that the pain in her hand is a lot worse and that she is barely able to open anything.  Stated that she received a cort.injection on Tuesday, 10/11/2020.  Cb# [phone].  Please advise.  Thank you.

## 2020-10-17 ENCOUNTER — Telehealth: Payer: Self-pay

## 2020-10-17 NOTE — Telephone Encounter (Signed)
Note made. Ready for pick up at the front desk. Patient aware.

## 2020-10-17 NOTE — Telephone Encounter (Signed)
yes

## 2020-10-17 NOTE — Telephone Encounter (Signed)
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

## 2020-10-24 ENCOUNTER — Ambulatory Visit: Payer: Self-pay | Admitting: Family Medicine

## 2020-10-24 DIAGNOSIS — Z0289 Encounter for other administrative examinations: Secondary | ICD-10-CM

## 2020-12-07 ENCOUNTER — Ambulatory Visit (INDEPENDENT_AMBULATORY_CARE_PROVIDER_SITE_OTHER): Payer: Self-pay | Admitting: Family Medicine

## 2020-12-07 ENCOUNTER — Encounter: Payer: Self-pay | Admitting: Family Medicine

## 2020-12-07 ENCOUNTER — Other Ambulatory Visit: Payer: Self-pay

## 2020-12-07 VITALS — BP 120/88 | HR 102 | Temp 98.3°F | Wt 176.4 lb

## 2020-12-07 DIAGNOSIS — F1910 Other psychoactive substance abuse, uncomplicated: Secondary | ICD-10-CM

## 2020-12-07 DIAGNOSIS — R109 Unspecified abdominal pain: Secondary | ICD-10-CM

## 2020-12-07 DIAGNOSIS — F1029 Alcohol dependence with unspecified alcohol-induced disorder: Secondary | ICD-10-CM

## 2020-12-07 DIAGNOSIS — F5101 Primary insomnia: Secondary | ICD-10-CM

## 2020-12-07 DIAGNOSIS — L309 Dermatitis, unspecified: Secondary | ICD-10-CM

## 2020-12-07 DIAGNOSIS — R1032 Left lower quadrant pain: Secondary | ICD-10-CM

## 2020-12-07 MED ORDER — OMEPRAZOLE 40 MG PO CPDR: 40.0000 mg | DELAYED_RELEASE_CAPSULE | Freq: Every day | ORAL | 3 refills | Status: DC

## 2020-12-07 MED ORDER — QUETIAPINE FUMARATE 50 MG PO TABS: 50.0000 mg | ORAL_TABLET | Freq: Every day | ORAL | 1 refills | Status: DC

## 2020-12-07 NOTE — Progress Notes (Signed)
Subjective:    Patient ID: Kelly Patton, female    DOB: Apr 09, 1971, 50 y.o.   MRN: [id number]  No chief complaint on file.   HPI Patient was seen today for follow-up.  Patient endorses difficulty sleeping.  States OTC meds did not help.  Tried several different medications, from a friend which helped.  MJ use helps.  Patient drinking less EtOH, states a pint may last 3 days. Patient endorses sharp, intermittent stomach pain noted as above "grinding/churning".  Endorses regular BMs 2-3/day.  Also with knee pain, "forgot which one hurts.  They switch".  Using insoles and shoes which helps.  Wearing Nike boots to work.  Patient mentions having a dry spot on the side of her mouth that has returned.  Requesting a cream as in the past the cream helped.  Past Medical History:  Diagnosis Date  . Anxiety   . Carpal tunnel syndrome, right   . Depression   . GERD (gastroesophageal reflux disease)   . Hypertension   . Shortness of breath dyspnea    with exertion uses inhaler    No Known Allergies  ROS General: Denies fever, chills, night sweats, changes in weight, changes in appetite + insomnia HEENT: Denies headaches, ear pain, changes in vision, rhinorrhea, sore throat CV: Denies CP, palpitations, SOB, orthopnea Pulm: Denies SOB, cough, wheezing GI: Denies nausea, vomiting, diarrhea, constipation+ abdominal pain GU: Denies dysuria, hematuria, frequency, vaginal discharge Msk: Denies muscle cramps + knee pain Neuro: Denies weakness, numbness, tingling Skin: Denies rashes, bruising  + rash Psych: Denies depression, anxiety, hallucinations    Objective:    Blood pressure 120/88, pulse (!) 102, temperature 98.3 F (36.8 C), temperature source Oral, weight 176 lb 6.4 oz (80 kg), SpO2 98 %.  Gen. Pleasant, well-nourished, in no distress, normal affect  HEENT: [place]/AT, face symmetric, conjunctiva clear, no scleral icterus, PERRLA, EOMI, nares patent without drainage Lungs: no  accessory muscle use, CTAB, no wheezes or rales Cardiovascular: RRR, no m/r/g, no peripheral edema Abdomen: BS present, soft, NT/ND, no hepatosplenomegaly. Musculoskeletal: No deformities, no cyanosis or clubbing, normal tone Neuro:  A&Ox3, CN II-XII intact, normal gait.  No tremor noted Skin:  Warm, dry, intact.  No spider telangiectasias.  Hypopigmented plaque at right corner of mouth.   Wt Readings from Last 3 Encounters:  12/07/20 176 lb 6.4 oz (80 kg)  07/27/20 171 lb (77.6 kg)  07/12/20 172 lb (78 kg)    Lab Results  Component Value Date   WBC 6.1 07/12/2020   HGB 12.8 07/12/2020   HCT 38.9 07/12/2020   PLT 413 (H) 07/12/2020   GLUCOSE 106 (H) 07/12/2020   CHOL 264 (H) 01/06/2020   TRIG (H) 01/06/2020    462.0 Triglyceride is over 400; calculations on Lipids are invalid.   HDL 54.60 01/06/2020   LDLDIRECT 120.0 01/06/2020   ALT 25 07/12/2020   AST 23 07/12/2020   NA 136 07/12/2020   K 3.4 (L) 07/12/2020   CL 100 07/12/2020   CREATININE 0.67 07/12/2020   BUN 18 07/12/2020   CO2 26 07/12/2020   TSH 0.82 04/07/2020   INR 0.9 04/07/2020   HGBA1C 5.5 01/06/2020    Assessment/Plan:  Left lower quadrant abdominal pain -Discussed possible causes including IBS, diverticulitis.  Also consider weight gain and Gyne causes -Given continued symptoms discussed referral to gastroenterology - Plan: Ambulatory referral to Gastroenterology, omeprazole (PRILOSEC) 40 MG capsule  Stomach discomfort -Discussed possible causes including IBS, alcoholic gastritis, GERD, hiatal hernia  -  Plan: Ambulatory referral to Gastroenterology, omeprazole (PRILOSEC) 40 MG capsule  Primary insomnia  -Discussed substance/EtOH abuse likely contributing -Sleep hygiene encouraged - Plan: QUEtiapine (SEROQUEL) 50 MG tablet  Alcohol dependence with unspecified alcohol-induced disorder (HCC) -Normal LFTs on 07/12/2020 -Patient encouraged to cut down on EtOH intake -Continue to monitor -Given strict  precautions  Substance abuse (HCC) -Counseling offered -Continue to monitor  Dermatitis -Cortisone cream  F/u prn  Abbe Amsterdam, MD

## 2020-12-07 NOTE — Patient Instructions (Addendum)
You have 3 refills on the gabapentin 300 mg. Abdominal Pain, Adult Pain in the abdomen (abdominal pain) can be caused by many things. Often, abdominal pain is not serious and it gets better with no treatment or by being treated at home. However, sometimes abdominal pain is serious. Your health care provider will ask questions about your medical history and do a physical exam to try to determine the cause of your abdominal pain. Follow these instructions at home: Medicines  Take over-the-counter and prescription medicines only as told by your health care provider.  Do not take a laxative unless told by your health care provider. General instructions  Watch your condition for any changes.  Drink enough fluid to keep your urine pale yellow.  Keep all follow-up visits as told by your health care provider. This is important.   Contact a health care provider if:  Your abdominal pain changes or gets worse.  You are not hungry or you lose weight without trying.  You are constipated or have diarrhea for more than 2-3 days.  You have pain when you urinate or have a bowel movement.  Your abdominal pain wakes you up at night.  Your pain gets worse with meals, after eating, or with certain foods.  You are vomiting and cannot keep anything down.  You have a fever.  You have blood in your urine. Get help right away if:  Your pain does not go away as soon as your health care provider told you to expect.  You cannot stop vomiting.  Your pain is only in areas of the abdomen, such as the right side or the left lower portion of the abdomen. Pain on the right side could be caused by appendicitis.  You have bloody or black stools, or stools that look like tar.  You have severe pain, cramping, or bloating in your abdomen.  You have signs of dehydration, such as: ? Dark urine, very little urine, or no urine. ? Cracked lips. ? Dry mouth. ? Sunken eyes. ? Sleepiness. ? Weakness.  You  have trouble breathing or chest pain. Summary  Often, abdominal pain is not serious and it gets better with no treatment or by being treated at home. However, sometimes abdominal pain is serious.  Watch your condition for any changes.  Take over-the-counter and prescription medicines only as told by your health care provider.  Contact a health care provider if your abdominal pain changes or gets worse.  Get help right away if you have severe pain, cramping, or bloating in your abdomen. This information is not intended to replace advice given to you by your health care provider. Make sure you discuss any questions you have with your health care provider. Document Revised: 10/16/2019 Document Reviewed: 01/05/2019 Elsevier Patient Education  2021 Elsevier Inc.  Alcohol Abuse and Dependence Information, Adult Alcohol is a widely available drug. People drink alcohol in different amounts. People who drink alcohol very often and in large amounts often have problems during and after drinking. They may develop what is called an alcohol use disorder. There are two main types of alcohol use disorders:  Alcohol abuse. This is when you use alcohol too much or too often. You may use alcohol to make yourself feel happy or to reduce stress. You may have a hard time setting a limit on the amount you drink.  Alcohol dependence. This is when you use alcohol consistently for a period of time, and your body changes as a result. This can  make it hard to stop drinking because you may start to feel sick or feel different when you do not use alcohol. These symptoms are known as withdrawal. How can alcohol abuse and dependence affect me? Alcohol abuse and dependence can have a negative effect on your life. Drinking too much can lead to addiction. You may feel like you need alcohol to function normally. You may drink alcohol before work in the morning, during the day, or as soon as you get home from work in the  evening. These actions can result in:  Poor work performance.  Job loss.  Financial problems.  Car crashes or criminal charges from driving after drinking alcohol.  Problems in your relationships with friends and family.  Losing the trust and respect of coworkers, friends, and family. Drinking heavily over a long period of time can permanently damage your body and brain, and can cause lifelong health issues, such as:  Damage to your liver or pancreas.  Heart problems, high blood pressure, or stroke.  Certain cancers.  Decreased ability to fight infections.  Brain or nerve damage.  Depression.  Early (premature) death. If you are careless or you crave alcohol, it is easy to drink more than your body can handle (overdose). Alcohol overdose is a serious situation that requires hospitalization. It may lead to permanent injuries or death. What can increase my risk?  Having a family history of alcohol abuse.  Having depression or other mental health conditions.  Beginning to drink at an early age.  Binge drinking often.  Experiencing trauma, stress, and an unstable home life during childhood.  Spending time with people who drink often. What actions can I take to prevent or manage alcohol abuse and dependence?  Do not drink alcohol if: ? Your health care provider tells you not to drink. ? You are pregnant, may be pregnant, or are planning to become pregnant.  If you drink alcohol: ? Limit how much you use to:  0-1 drink a day for women.  0-2 drinks a day for men. ? Be aware of how much alcohol is in your drink. In the [place] one drink equals one 12 oz bottle of beer (355 mL), one 5 oz glass of wine (148 mL), or one 1 oz glass of hard liquor (44 mL).  Stop drinking if you have been drinking too much. This can be very hard to do if you are used to abusing alcohol. If you begin to have withdrawal symptoms, talk with your health care provider or a person that you trust.  These symptoms may include anxiety, shaky hands, headache, nausea, sweating, or not being able to sleep.  Choose to drink nonalcoholic beverages in social gatherings and places where there may be alcohol. Activity  Spend more time on activities that you enjoy that do not involve alcohol, like hobbies or exercise.  Find healthy ways to cope with stress, such as exercise, meditation, or spending time with people you care about. General information  Talk to your family, coworkers, and friends about supporting you in your efforts to stop drinking. If they drink, ask them not to drink around you. Spend more time with people who do not drink alcohol.  If you think that you have an alcohol dependency problem: ? Tell friends or family about your concerns. ? Talk with your health care provider or another health professional about where to get help. ? Work with a Paramedic and a Network engineer. ? Consider joining a support group for  people who struggle with alcohol abuse and dependence. Where to find support  Your health care provider.  SMART Recovery: www.smartrecovery.org Therapy and support groups  Local treatment centers or chemical dependency counselors.  Local AA groups in your community: SalaryStart.tn   Where to find more information  Centers for Disease Control and Prevention: FootballExhibition.com.br  [employer] on Alcohol Abuse and Alcoholism: BasicStudents.dk  Alcoholics Anonymous (AA): SalaryStart.tn Contact a health care provider if:  You drank more or for longer than you intended on more than one occasion.  You tried to stop drinking or to cut back on how much you drink, but you were not able to.  You often drink to the point of vomiting or passing out.  You want to drink so badly that you cannot think about anything else.  You have problems in your life due to drinking, but you continue to drink.  You keep drinking even though you feel anxious, depressed, or  have experienced memory loss.  You have stopped doing the things you used to enjoy in order to drink.  You have to drink more than you used to in order to get the effect you want.  You experience anxiety, sweating, nausea, shakiness, and trouble sleeping when you try to stop drinking. Get help right away if:  You have thoughts about hurting yourself or others.  You have serious withdrawal symptoms, including: ? Confusion. ? Racing heart. ? High blood pressure. ? Fever. If you ever feel like you may hurt yourself or others, or have thoughts about taking your own life, get help right away. You can go to your nearest emergency department or call:  Your local emergency services (911 in the [place]  A suicide crisis helpline, such as the National Suicide Prevention Lifeline at (867)791-2239. This is open 24 hours a day. Summary  Alcohol abuse and dependence can have a negative effect on your life. Drinking too much or too often can lead to addiction.  If you drink alcohol, limit how much you use.  If you are having trouble keeping your drinking under control, find ways to change your behavior. Hobbies, calming activities, exercise, or support groups can help.  If you feel you need help with changing your drinking habits, talk with your health care provider, a good friend, or a therapist, or go to an AA group. This information is not intended to replace advice given to you by your health care provider. Make sure you discuss any questions you have with your health care provider. Document Revised: 12/16/2018 Document Reviewed: 11/04/2018 Elsevier Patient Education  2021 Elsevier Inc.  Insomnia Insomnia is a sleep disorder that makes it difficult to fall asleep or stay asleep. Insomnia can cause fatigue, low energy, difficulty concentrating, mood swings, and poor performance at work or school. There are three different ways to classify insomnia:  Difficulty falling asleep.  Difficulty  staying asleep.  Waking up too early in the morning. Any type of insomnia can be long-term (chronic) or short-term (acute). Both are common. Short-term insomnia usually lasts for three months or less. Chronic insomnia occurs at least three times a week for longer than three months. What are the causes? Insomnia may be caused by another condition, situation, or substance, such as:  Anxiety.  Certain medicines.  Gastroesophageal reflux disease (GERD) or other gastrointestinal conditions.  Asthma or other breathing conditions.  Restless legs syndrome, sleep apnea, or other sleep disorders.  Chronic pain.  Menopause.  Stroke.  Abuse of alcohol, tobacco, or  illegal drugs.  Mental health conditions, such as depression.  Caffeine.  Neurological disorders, such as Alzheimer's disease.  An overactive thyroid (hyperthyroidism). Sometimes, the cause of insomnia may not be known. What increases the risk? Risk factors for insomnia include:  Gender. Women are affected more often than men.  Age. Insomnia is more common as you get older.  Stress.  Lack of exercise.  Irregular work schedule or working night shifts.  Traveling between different time zones.  Certain medical and mental health conditions. What are the signs or symptoms? If you have insomnia, the main symptom is having trouble falling asleep or having trouble staying asleep. This may lead to other symptoms, such as:  Feeling fatigued or having low energy.  Feeling nervous about going to sleep.  Not feeling rested in the morning.  Having trouble concentrating.  Feeling irritable, anxious, or depressed. How is this diagnosed? This condition may be diagnosed based on:  Your symptoms and medical history. Your health care provider may ask about: ? Your sleep habits. ? Any medical conditions you have. ? Your mental health.  A physical exam. How is this treated? Treatment for insomnia depends on the cause.  Treatment may focus on treating an underlying condition that is causing insomnia. Treatment may also include:  Medicines to help you sleep.  Counseling or therapy.  Lifestyle adjustments to help you sleep better. Follow these instructions at home: Eating and drinking  Limit or avoid alcohol, caffeinated beverages, and cigarettes, especially close to bedtime. These can disrupt your sleep.  Do not eat a large meal or eat spicy foods right before bedtime. This can lead to digestive discomfort that can make it hard for you to sleep.   Sleep habits  Keep a sleep diary to help you and your health care provider figure out what could be causing your insomnia. Write down: ? When you sleep. ? When you wake up during the night. ? How well you sleep. ? How rested you feel the next day. ? Any side effects of medicines you are taking. ? What you eat and drink.  Make your bedroom a dark, comfortable place where it is easy to fall asleep. ? Put up shades or blackout curtains to block light from outside. ? Use a white noise machine to block noise. ? Keep the temperature cool.  Limit screen use before bedtime. This includes: ? Watching TV. ? Using your smartphone, tablet, or computer.  Stick to a routine that includes going to bed and waking up at the same times every day and night. This can help you fall asleep faster. Consider making a quiet activity, such as reading, part of your nighttime routine.  Try to avoid taking naps during the day so that you sleep better at night.  Get out of bed if you are still awake after 15 minutes of trying to sleep. Keep the lights down, but try reading or doing a quiet activity. When you feel sleepy, go back to bed.   General instructions  Take over-the-counter and prescription medicines only as told by your health care provider.  Exercise regularly, as told by your health care provider. Avoid exercise starting several hours before bedtime.  Use relaxation  techniques to manage stress. Ask your health care provider to suggest some techniques that may work well for you. These may include: ? Breathing exercises. ? Routines to release muscle tension. ? Visualizing peaceful scenes.  Make sure that you drive carefully. Avoid driving if you feel very sleepy.  Keep all follow-up visits as told by your health care provider. This is important. Contact a health care provider if:  You are tired throughout the day.  You have trouble in your daily routine due to sleepiness.  You continue to have sleep problems, or your sleep problems get worse. Get help right away if:  You have serious thoughts about hurting yourself or someone else. If you ever feel like you may hurt yourself or others, or have thoughts about taking your own life, get help right away. You can go to your nearest emergency department or call:  Your local emergency services (911 in the [place]  A suicide crisis helpline, such as the National Suicide Prevention Lifeline at 857-694-92321-623-258-0161. This is open 24 hours a day. Summary  Insomnia is a sleep disorder that makes it difficult to fall asleep or stay asleep.  Insomnia can be long-term (chronic) or short-term (acute).  Treatment for insomnia depends on the cause. Treatment may focus on treating an underlying condition that is causing insomnia.  Keep a sleep diary to help you and your health care provider figure out what could be causing your insomnia. This information is not intended to replace advice given to you by your health care provider. Make sure you discuss any questions you have with your health care provider. Document Revised: 07/07/2020 Document Reviewed: 07/07/2020 Elsevier Patient Education  2021 [employer]

## 2020-12-16 ENCOUNTER — Encounter: Payer: Self-pay | Admitting: Family Medicine

## 2020-12-16 DIAGNOSIS — F1910 Other psychoactive substance abuse, uncomplicated: Secondary | ICD-10-CM | POA: Insufficient documentation

## 2020-12-16 DIAGNOSIS — F1029 Alcohol dependence with unspecified alcohol-induced disorder: Secondary | ICD-10-CM | POA: Insufficient documentation

## 2020-12-16 HISTORY — DX: Alcohol dependence with unspecified alcohol-induced disorder: F10.29

## 2020-12-20 ENCOUNTER — Encounter: Payer: Self-pay | Admitting: Nurse Practitioner

## 2020-12-28 ENCOUNTER — Ambulatory Visit (INDEPENDENT_AMBULATORY_CARE_PROVIDER_SITE_OTHER): Payer: Self-pay | Admitting: Family Medicine

## 2020-12-28 ENCOUNTER — Encounter: Payer: Self-pay | Admitting: Family Medicine

## 2020-12-28 ENCOUNTER — Other Ambulatory Visit: Payer: Self-pay

## 2020-12-28 VITALS — BP 128/80 | HR 94 | Temp 98.9°F | Wt 173.2 lb

## 2020-12-28 DIAGNOSIS — F4310 Post-traumatic stress disorder, unspecified: Secondary | ICD-10-CM

## 2020-12-28 DIAGNOSIS — F1910 Other psychoactive substance abuse, uncomplicated: Secondary | ICD-10-CM

## 2020-12-28 DIAGNOSIS — G4709 Other insomnia: Secondary | ICD-10-CM

## 2020-12-28 DIAGNOSIS — B372 Candidiasis of skin and nail: Secondary | ICD-10-CM

## 2020-12-28 MED ORDER — NYSTATIN 100000 UNIT/GM EX CREA: 1.0000 "application " | TOPICAL_CREAM | Freq: Two times a day (BID) | CUTANEOUS | 2 refills | Status: DC
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
## 2021-01-06 ENCOUNTER — Ambulatory Visit (INDEPENDENT_AMBULATORY_CARE_PROVIDER_SITE_OTHER): Payer: Self-pay | Admitting: Nurse Practitioner

## 2021-01-06 ENCOUNTER — Other Ambulatory Visit (INDEPENDENT_AMBULATORY_CARE_PROVIDER_SITE_OTHER): Payer: Self-pay

## 2021-01-06 ENCOUNTER — Encounter: Payer: Self-pay | Admitting: Nurse Practitioner

## 2021-01-06 VITALS — BP 110/76 | HR 100 | Ht 62.0 in | Wt 172.5 lb

## 2021-01-06 DIAGNOSIS — R1032 Left lower quadrant pain: Secondary | ICD-10-CM

## 2021-01-06 DIAGNOSIS — R1031 Right lower quadrant pain: Secondary | ICD-10-CM

## 2021-01-06 DIAGNOSIS — R1011 Right upper quadrant pain: Secondary | ICD-10-CM

## 2021-01-06 DIAGNOSIS — K769 Liver disease, unspecified: Secondary | ICD-10-CM

## 2021-01-06 LAB — COMPREHENSIVE METABOLIC PANEL
ALT: 19 U/L (ref 0–35)
AST: 19 U/L (ref 0–37)
Albumin: 4.3 g/dL (ref 3.5–5.2)
Alkaline Phosphatase: 56 U/L (ref 39–117)
BUN: 17 mg/dL (ref 6–23)
CO2: 26 mEq/L (ref 19–32)
Calcium: 9.7 mg/dL (ref 8.4–10.5)
Chloride: 101 mEq/L (ref 96–112)
Creatinine, Ser: 0.76 mg/dL (ref 0.40–1.20)
GFR: 91.29 mL/min (ref >60.00)
Glucose, Bld: 103 mg/dL — ABNORMAL HIGH (ref 70–99)
Potassium: 3.9 mEq/L (ref 3.5–5.1)
Sodium: 135 mEq/L (ref 135–145)
Total Bilirubin: 0.3 mg/dL (ref 0.2–1.2)
Total Protein: 7.4 g/dL (ref 6.0–8.3)

## 2021-01-06 LAB — CBC WITH DIFFERENTIAL/PLATELET
Basophils Absolute: 0 10*3/uL (ref 0.0–0.1)
Basophils Relative: 0.5 % (ref 0.0–3.0)
Eosinophils Absolute: 0.1 10*3/uL (ref 0.0–0.7)
Eosinophils Relative: 1.5 % (ref 0.0–5.0)
HCT: 37 % (ref 36.0–46.0)
Hemoglobin: 12.7 g/dL (ref 12.0–15.0)
Lymphocytes Relative: 35.5 % (ref 12.0–46.0)
Lymphs Abs: 1.7 10*3/uL (ref 0.7–4.0)
MCHC: 34.4 g/dL (ref 30.0–36.0)
MCV: 90.3 fl (ref 78.0–100.0)
Monocytes Absolute: 0.6 10*3/uL (ref 0.1–1.0)
Monocytes Relative: 12.6 % — ABNORMAL HIGH (ref 3.0–12.0)
Neutro Abs: 2.4 10*3/uL (ref 1.4–7.7)
Neutrophils Relative %: 49.9 % (ref 43.0–77.0)
Platelets: 371 10*3/uL (ref 150.0–400.0)
RBC: 4.1 Mil/uL (ref 3.87–5.11)
RDW: 12.7 % (ref 11.5–15.5)
WBC: 4.8 10*3/uL (ref 4.0–10.5)

## 2021-01-06 MED ORDER — OMEPRAZOLE 40 MG PO CPDR: 40.0000 mg | DELAYED_RELEASE_CAPSULE | Freq: Every day | ORAL | 1 refills | Status: DC

## 2021-01-06 MED ORDER — DICYCLOMINE HCL 10 MG PO CAPS: 10.0000 mg | ORAL_CAPSULE | Freq: Three times a day (TID) | ORAL | 0 refills | Status: DC | PRN

## 2021-01-06 NOTE — Progress Notes (Signed)
01/06/2021 Adean Milosevic [id number] May 31, 1971   CHIEF COMPLAINT: Abdominal pain   HISTORY OF PRESENT ILLNESS: Lorell Thibodaux. Fleeman is a 50 year old female with a past medical history of anxiety, depression, hypertension, alcohol abuse, daily marijuana use and last used cocaine 5 years ago. S/P right trigger finger/hand surgey.  She presents to our office today as referred by Dr. Abbe Amsterdam for further evaluation regarding right upper quadrant and LLQ pain. She stated her right upper quadrant pain is constant and is not triggered by eating or change of position.  She also intermittently feels a knot to the right upper quadrant area for the past 2 to 3 years.  She also has left lower quadrant pain which comes and goes for the past 2 years as well.  She describes the left lower quadrant pain as intense like lightning and last for a few minutes at a time then abates.  She sometimes has right lower quadrant abdominal pain.  No nausea or vomiting.  No heartburn or dysphagia.  She is passing a brown soft to mud-like stool 2-5 times daily.  She sees a small amount of bright red blood on the toilet tissue once every few months without noticeable constipation or straining.  No anorectal pain.  No fever, sweats or chills.  No noticeable weight loss.  She previously drank 2 fifths of alcohol every 3 days x 3 years then decreased to 1/5 or less weekly.  She underwent an abdominal ultrasound 10/06/2018 while in the ED due to having right upper quadrant abdominal pain.  The abdominal sonogram identified a 1.9 x 1.2 x 2.2 cm left hepatic lobe solid lesion, the gallbladder was normal.  Follow-up imaging was not done.    CBC Latest Ref Rng & Units 07/12/2020 04/07/2020 01/06/2020  WBC 4.0 - 10.5 K/uL 6.1 6.3 7.6  Hemoglobin 12.0 - 15.0 g/dL 82.9 56.2 13.0  Hematocrit 36.0 - 46.0 % 38.9 36.9 36.8  Platelets 150 - 400 K/uL 413(H) 395 356.0   CMP Latest Ref Rng & Units 07/12/2020 04/07/2020 01/06/2020   Glucose 70 - 99 mg/dL 865(H) 846(N) 629(B)  BUN 6 - 20 mg/dL 18 13 13   Creatinine 0.44 - 1.00 mg/dL 2.84 1.32  Sodium 135 - 145 mmol/L 136 139 138  Potassium 3.5 - 5.1 mmol/L 3.4(L) 4.3 4.2  Chloride 98 - 111 mmol/L 100 103 98  CO2 22 - 32 mmol/L 26 28 32  Calcium 8.9 - 10.3 mg/dL 4.40 10.2 72.5  Total Protein 6.5 - 8.1 g/dL 7.5 7.0 7.1  Total Bilirubin 0.3 - 1.2 mg/dL 0.7 0.2 0.3  Alkaline Phos 38 - 126 U/L 53 - 58  AST 15 - 41 U/L 23 13 13   ALT 0 - 44 U/L 25 15 20     Past Medical History:  Diagnosis Date  . Anxiety   . Carpal tunnel syndrome, right   . Depression   . GERD (gastroesophageal reflux disease)   . Hypertension   . Shortness of breath dyspnea    with exertion uses inhaler   Past Surgical History:  Procedure Laterality Date  . CARPAL TUNNEL RELEASE Right 01/31/2016   Procedure: CARPAL TUNNEL RELEASE;  Surgeon: , MD;  Location: [facility] OR;  Service: Orthopedics;  Laterality: Right;  . HAND TENDON SURGERY     right  . TRIGGER FINGER RELEASE Right 11/26/2017   Procedure: RELEASE TRIGGER FINGER RIGHT THUMB;  Surgeon: Cammy Copa, MD;  Location: [facility] OR;  Service:  Orthopedics;  Laterality: Right;   Social History: She is single.  She is a Location manager.  Cigarettes 1ppd x 20 years, quit smoking 4 years ago. Smokes marijuana daily since age 19. Past cocaine use 5 years ago. She drinks 1/5th or less of liquor weekly.   Family History: Father age 4 HTN. Mother deceased age 76 killed. She has 5 brothers all are relatively healthy.   No Known Allergies    Outpatient Encounter Medications as of 01/06/2021  Medication Sig  . acetaminophen (TYLENOL) 500 MG tablet Take 500 mg by mouth every 6 (six) hours as needed for mild pain.  . cetirizine (ZYRTEC) 10 MG tablet Take 1 tablet (10 mg total) by mouth daily. (Patient not taking: Reported on 12/28/2020)  . chlorthalidone (HYGROTON) 25 MG tablet Take 1 tablet (25 mg total) by mouth daily.  .  diclofenac Sodium (VOLTAREN) 1 % GEL Apply 2 g topically 4 (four) times daily. (Patient not taking: Reported on 12/28/2020)  . gabapentin (NEURONTIN) 100 MG capsule Take 1 capsule (100 mg total) by mouth 3 (three) times daily.  Marland Kitchen gabapentin (NEURONTIN) 300 MG capsule Take 1 capsule (300 mg total) by mouth 2 (two) times daily.  Marland Kitchen ketoconazole (NIZORAL) 2 % cream Apply 1 application topically daily. (Patient not taking: Reported on 12/28/2020)  . loratadine (CLARITIN) 10 MG tablet Take 1 tablet (10 mg total) by mouth daily. (Patient not taking: No sig reported)  . meloxicam (MOBIC) 15 MG tablet Take 1 tablet (15 mg total) by mouth daily. (Patient not taking: No sig reported)  . nystatin cream (MYCOSTATIN) Apply 1 application topically 2 (two) times daily.  Marland Kitchen omeprazole (PRILOSEC) 40 MG capsule Take 1 capsule (40 mg total) by mouth daily.  . potassium chloride SA (KLOR-CON) 20 MEQ tablet Take 1 tablet (20 mEq total) by mouth daily. For low potassium. (Patient not taking: No sig reported)  . QUEtiapine (SEROQUEL) 50 MG tablet Take 1 tablet (50 mg total) by mouth at bedtime.  Marland Kitchen tiZANidine (ZANAFLEX) 4 MG capsule Take 1 capsule (4 mg total) by mouth 3 (three) times daily as needed for muscle spasms. (Patient not taking: No sig reported)  . traMADol (ULTRAM) 50 MG tablet Take 1 tablet (50 mg total) by mouth 3 (three) times daily as needed.   No facility-administered encounter medications on file as of 01/06/2021.    REVIEW OF SYSTEMS: Gen: Denies fever, sweats or chills. No weight loss.  CV: Denies chest pain, palpitations or edema. Resp: Denies cough, shortness of breath of hemoptysis.  GI: See HPI. GU : Denies urinary burning, blood in urine, increased urinary frequency or incontinence. MS: Denies joint pain, muscles aches or weakness. Derm: Denies rash, itchiness, skin lesions or unhealing ulcers. Psych: + Anxiety and depression.  Heme: Denies bruising, bleeding. Neuro:  Denies headaches,  dizziness or paresthesias. Endo:  Denies any problems with DM, thyroid or adrenal function.   PHYSICAL EXAM: Ht 5\' 2"  (1.575 m) Comment: height measured without shoes  Wt 172 lb 8 oz (78.2 kg)   BMI 31.55 kg/m    General: Well developed  50 year old female in no acute distress. Head: Normocephalic and atraumatic. Eyes:  Sclerae non-icteric, conjunctive pink. Ears: Normal auditory acuity. Mouth: Dentition intact. No ulcers or lesions.  Neck: Supple, no lymphadenopathy or thyromegaly.  Lungs: Clear bilaterally to auscultation without wheezes, crackles or rhonchi. Heart: Regular rate and rhythm. No murmur, rub or gallop appreciated.  Abdomen: Soft, nondistended. RUQ, RLQ and LLQ tenderness without rebound  or guarding. No masses. No hepatosplenomegaly. Normoactive bowel sounds x 4 quadrants.  Rectal: Deferred  Musculoskeletal: Symmetrical with no gross deformities. Skin: Warm and dry. No rash or lesions on visible extremities. Extremities: No edema. Neurological: Alert oriented x 4, no focal deficits.  Psychological:  Alert and cooperative. Normal mood and affect.  ASSESSMENT AND PLAN:  64.  50 year old female with right upper quadrant abdominal pain which is constant with variable RLQ and LLQ pains. -CBC, CMP -CTAP with oral and IV contrast -Omeprazole 40 mg 1 p.o. daily -Dicyclomine 10 mg 1 p.o. 3 times daily as needed -Patient to follow up in the office in 2 to 3 weeks, to further discuss scheduling EGD and colonoscopy at that time  2.  Abdominal sonogram 10/06/2018 showed a 2.2cm left hepatic solid nodule -CTAP as ordered above.   3. History of polysubstance abuse     CC:  [name], MD

## 2021-01-06 NOTE — Progress Notes (Signed)
Agree with assessment and plan as outlined.  

## 2021-01-06 NOTE — Patient Instructions (Addendum)
If you are age 51 or younger, your body mass index should be between 19-25. Your Body mass index is 31.55 kg/m. If this is out of the aformentioned range listed, please consider follow up with your Primary Care Provider.   IMAGING:  . You will be contacted by [facility] Scheduling (Your caller ID will indicate phone # [phone]) in the next 2 days to schedule your CT scan. If you have not heard from them within 2 business days, please call [facility] Scheduling at [phone] to follow up on the status of your appointment.    LABS:  Lab work has been ordered for you today. Our lab is located in the basement. Press "B" on the elevator. The lab is located at the first door on the left as you exit the elevator.  MEDICATION: We have sent the following medication to your pharmacy for you to pick up at your convenience: Dicyclomine 10 MG tablets, take 1 three times a day if needed for abdominal pain. Omeprazole 40 MG once a day.  Please call to schedule a follow up with [place] once you have the CT done.    It was great seeing you today! Thank you for entrusting me with your care and choosing [facility].  Arnaldo Natal, CRNP

## 2021-01-06 NOTE — Progress Notes (Signed)
RADIOLOGY SCHEDULING REQUEST SENT TO: [facility] Scheduling via secure staff message.  RADIOLOGY Wilkie Aye  [facility] Phone: [phone] Fax: [phone]   Patient Name: Kelly Patton DOB: 1971/03/01 MRN #: [id number]  Imaging Ordered: CT abd pelvis  Diagnosis: Left liver lesion/RUQ,RLQ,LLQ,LUQ pain  Ordering Provider: Arnaldo Natal, CRNP  Is a Prior Authorization needed? We are in the process of obtaining it now  Is the patient Diabetic? No  Does the patient have Hypertension? Yes  Does the patient have any implanted devices or hardware? No  Date of last BUN/Creat, if needed? 01/06/21  Patient Weight? 172lb  Is the patient able to get on the table? Yes  Has the patient been diagnosed with COVID? No  Is the patient waiting on COVID testing results? No  Thank you for your assistance! [facility]

## 2021-01-16 ENCOUNTER — Encounter (HOSPITAL_COMMUNITY): Payer: Self-pay

## 2021-01-16 ENCOUNTER — Ambulatory Visit (HOSPITAL_COMMUNITY)
Admission: RE | Admit: 2021-01-16 | Discharge: 2021-01-16 | Disposition: A | Discharge status: Home / Self Care | Payer: Self-pay | Source: Ambulatory Visit | Attending: Nurse Practitioner | Admitting: Nurse Practitioner

## 2021-01-16 ENCOUNTER — Other Ambulatory Visit: Payer: Self-pay

## 2021-01-16 DIAGNOSIS — K769 Liver disease, unspecified: Secondary | ICD-10-CM | POA: Insufficient documentation

## 2021-01-16 DIAGNOSIS — R1031 Right lower quadrant pain: Secondary | ICD-10-CM | POA: Insufficient documentation

## 2021-01-16 DIAGNOSIS — R1011 Right upper quadrant pain: Secondary | ICD-10-CM | POA: Insufficient documentation

## 2021-01-16 DIAGNOSIS — R1032 Left lower quadrant pain: Secondary | ICD-10-CM | POA: Insufficient documentation

## 2021-01-16 MED ORDER — SODIUM CHLORIDE (PF) 0.9 % IJ SOLN
INTRAMUSCULAR | Status: AC
  Filled 2021-01-16: qty 50

## 2021-01-16 MED ORDER — IOHEXOL 300 MG/ML  SOLN
100.0000 mL | Freq: Once | INTRAMUSCULAR | Status: AC | PRN
  Administered 2021-01-16: 100 mL via INTRAVENOUS

## 2021-01-20 ENCOUNTER — Other Ambulatory Visit: Payer: Self-pay

## 2021-01-20 DIAGNOSIS — K769 Liver disease, unspecified: Secondary | ICD-10-CM

## 2021-02-07 ENCOUNTER — Telehealth: Payer: Self-pay | Admitting: Nurse Practitioner

## 2021-02-07 NOTE — Telephone Encounter (Signed)
Inbound call from patient. States she gotten new insurance and her MRI at [facility] does not accept her insurance. She now have [facility] and asking if she can have a referral to a facility that will accept. Best contact number [phone]

## 2021-02-08 ENCOUNTER — Other Ambulatory Visit: Payer: Self-pay

## 2021-02-09 NOTE — Telephone Encounter (Signed)
Patient notified to contact [facility] to see if her new insurance needs a referral to be seen. If so then she can contact her PCP.

## 2021-03-06 ENCOUNTER — Telehealth: Payer: Self-pay | Admitting: Family Medicine

## 2021-03-09 ENCOUNTER — Ambulatory Visit: Payer: Self-pay | Admitting: Family Medicine

## 2021-03-21 ENCOUNTER — Other Ambulatory Visit: Payer: Self-pay

## 2021-03-22 ENCOUNTER — Ambulatory Visit (INDEPENDENT_AMBULATORY_CARE_PROVIDER_SITE_OTHER): Payer: BLUE CROSS/BLUE SHIELD | Admitting: Family Medicine

## 2021-03-22 ENCOUNTER — Encounter: Payer: Self-pay | Admitting: Family Medicine

## 2021-03-22 VITALS — BP 112/88 | HR 118 | Temp 99.4°F | Wt 165.0 lb

## 2021-03-22 DIAGNOSIS — Z7289 Other problems related to lifestyle: Secondary | ICD-10-CM

## 2021-03-22 DIAGNOSIS — Z789 Other specified health status: Secondary | ICD-10-CM

## 2021-03-22 DIAGNOSIS — F4310 Post-traumatic stress disorder, unspecified: Secondary | ICD-10-CM

## 2021-03-22 DIAGNOSIS — R319 Hematuria, unspecified: Secondary | ICD-10-CM

## 2021-03-22 DIAGNOSIS — M545 Low back pain, unspecified: Secondary | ICD-10-CM | POA: Diagnosis not present

## 2021-03-22 LAB — COMPREHENSIVE METABOLIC PANEL
ALT: 12 U/L (ref 0–35)
AST: 11 U/L (ref 0–37)
Albumin: 4.3 g/dL (ref 3.5–5.2)
Alkaline Phosphatase: 59 U/L (ref 39–117)
BUN: 18 mg/dL (ref 6–23)
CO2: 28 mEq/L (ref 19–32)
Calcium: 10.1 mg/dL (ref 8.4–10.5)
Chloride: 102 mEq/L (ref 96–112)
Creatinine, Ser: 0.71 mg/dL (ref 0.40–1.20)
GFR: 98.91 mL/min (ref >60.00)
Glucose, Bld: 83 mg/dL (ref 70–99)
Potassium: 3.5 mEq/L (ref 3.5–5.1)
Sodium: 139 mEq/L (ref 135–145)
Total Bilirubin: 0.3 mg/dL (ref 0.2–1.2)
Total Protein: 7 g/dL (ref 6.0–8.3)

## 2021-03-22 LAB — CBC WITH DIFFERENTIAL/PLATELET
Basophils Absolute: 0 10*3/uL (ref 0.0–0.1)
Basophils Relative: 0.6 % (ref 0.0–3.0)
Eosinophils Absolute: 0 10*3/uL (ref 0.0–0.7)
Eosinophils Relative: 0.7 % (ref 0.0–5.0)
HCT: 37.6 % (ref 36.0–46.0)
Hemoglobin: 13.1 g/dL (ref 12.0–15.0)
Lymphocytes Relative: 32.2 % (ref 12.0–46.0)
Lymphs Abs: 2 10*3/uL (ref 0.7–4.0)
MCHC: 34.7 g/dL (ref 30.0–36.0)
MCV: 89.8 fl (ref 78.0–100.0)
Monocytes Absolute: 0.7 10*3/uL (ref 0.1–1.0)
Monocytes Relative: 10.7 % (ref 3.0–12.0)
Neutro Abs: 3.5 10*3/uL (ref 1.4–7.7)
Neutrophils Relative %: 55.8 % (ref 43.0–77.0)
Platelets: 436 10*3/uL — ABNORMAL HIGH (ref 150.0–400.0)
RBC: 4.19 Mil/uL (ref 3.87–5.11)
RDW: 13.1 % (ref 11.5–15.5)
WBC: 6.4 10*3/uL (ref 4.0–10.5)

## 2021-03-22 LAB — POCT URINALYSIS DIPSTICK
Bilirubin, UA: NEGATIVE
Glucose, UA: NEGATIVE
Ketones, UA: NEGATIVE
Leukocytes, UA: NEGATIVE
Nitrite, UA: NEGATIVE
Protein, UA: POSITIVE — AB
Spec Grav, UA: 1.025 (ref 1.010–1.025)
Urobilinogen, UA: NEGATIVE E.U./dL — AB
pH, UA: 6 (ref 5.0–8.0)

## 2021-03-22 LAB — LIPASE: Lipase: 61 U/L — ABNORMAL HIGH (ref 11.0–59.0)

## 2021-03-22 LAB — VITAMIN B12: Vitamin B-12: 279 pg/mL (ref 211–911)

## 2021-03-22 LAB — FOLATE: Folate: 8.8 ng/mL (ref >5.9)

## 2021-03-22 NOTE — Progress Notes (Signed)
Subjective:    Patient ID: Kelly Patton, female    DOB: 26-Nov-1970, 50 y.o.   MRN: [id number]  Chief Complaint  Patient presents with   Back Pain    Lower back and right side pain.    HPI Patient was seen today for ongoing concern of low back pain and R side pain.  Pt denies injury.  Pt states pain shoots across at times.  Pt celebrating her day off by drinking EtOH.  Had several shots prior to visit and while waiting on provider.  Pt still living by herself, which can be difficult at times.  In a new relationship which is going well.  Past Medical History:  Diagnosis Date   Allergies    Anxiety    Arthritis    Carpal tunnel syndrome, right    Depression    GERD (gastroesophageal reflux disease)    Hypertension    Shortness of breath dyspnea    with exertion uses inhaler    No Known Allergies  ROS General: Denies fever, chills, night sweats, changes in weight, changes in appetite HEENT: Denies headaches, ear pain, changes in vision, rhinorrhea, sore throat CV: Denies CP, palpitations, SOB, orthopnea Pulm: Denies SOB, cough, wheezing GI: Denies abdominal pain, nausea, vomiting, diarrhea, constipation GU: Denies dysuria, hematuria, frequency, vaginal discharge Msk: Denies muscle cramps, joint pains  +low back pain Neuro: Denies weakness, numbness, tingling  Skin: Denies rashes, bruising Psych: Denies depression, anxiety, hallucinations +PTSD, EtOH use    Objective:    Blood pressure 112/88, pulse (!) 118, temperature 99.4 F (37.4 C), temperature source Oral, weight 165 lb (74.8 kg), SpO2 95 %.  Gen. Pleasant, intoxicated, well-nourished, in no distress, elevated affect   HEENT: [place]/AT, face symmetric, conjunctiva clear, no scleral icterus, PERRLA, EOMI, nares patent without drainage Lungs: no accessory muscle use, CTAB, no wheezes or rales Cardiovascular: Tachycardia, no m/r/g, no peripheral edema Musculoskeletal: TTP of  R lumbar paraspinal muscles.  No  sciatica or TTP of cervical, thoracic, or lumbar spine.  No deformities, no cyanosis or clubbing, normal tone Neuro:  A&Ox3, CN II-XII intact, normal gait Skin:  Warm, no lesions/ rash   Wt Readings from Last 3 Encounters:  03/22/21 165 lb (74.8 kg)  01/06/21 172 lb 8 oz (78.2 kg)  12/28/20 173 lb 3.2 oz (78.6 kg)    Lab Results  Component Value Date   WBC 4.8 01/06/2021   HGB 12.7 01/06/2021   HCT 37.0 01/06/2021   PLT 371.0 01/06/2021   GLUCOSE 103 (H) 01/06/2021   CHOL 264 (H) 01/06/2020   TRIG (H) 01/06/2020    462.0 Triglyceride is over 400; calculations on Lipids are invalid.   HDL 54.60 01/06/2020   LDLDIRECT 120.0 01/06/2020   ALT 19 01/06/2021   AST 19 01/06/2021   NA 135 01/06/2021   K 3.9 01/06/2021   CL 101 01/06/2021   CREATININE 0.76 01/06/2021   BUN 17 01/06/2021   CO2 26 01/06/2021   TSH 0.82 04/07/2020   INR 0.9 04/07/2020   HGBA1C 5.5 01/06/2020    Assessment/Plan:  Right-sided low back pain without sciatica, unspecified chronicity  -supportive care including OTC treatments/meds, heat, stretching, etc. - Plan: POCT urinalysis dipstick, CBC with Differential/Platelet, CMP, Lipase, Vitamin B12, Folate  Alcohol use -cessation advised. Counseling provided -encouraged to schedule psychiatry appt.  - Plan: CBC with Differential/Platelet, CMP, Lipase, Vitamin B12, Folate  PTSD (post-traumatic stress disorder) -discussed looking into counseling again -given resources  Hematuria -UA with 1+ RBCs -discussed  possible causes including renal calculi, UTI -increase intake of water, avoid EtOH -Plan: UCx  F/u prn  Abbe Amsterdam, MD

## 2021-03-23 LAB — URINE CULTURE
MICRO NUMBER:: 12114284
SPECIMEN QUALITY:: ADEQUATE

## 2021-04-13 ENCOUNTER — Encounter: Payer: Self-pay | Admitting: Family Medicine

## 2021-04-13 ENCOUNTER — Telehealth: Payer: Self-pay | Admitting: Family Medicine

## 2021-04-13 DIAGNOSIS — I10 Essential (primary) hypertension: Secondary | ICD-10-CM

## 2021-04-13 NOTE — Telephone Encounter (Signed)
PT needs a refill of their chlorthalidone (HYGROTON) 25 MG tablet sent into the [facility] on [address]

## 2021-04-14 MED ORDER — CHLORTHALIDONE 25 MG PO TABS: 25.0000 mg | ORAL_TABLET | Freq: Every day | ORAL | 1 refills | Status: DC

## 2021-04-14 NOTE — Addendum Note (Signed)
Addended by: Kathreen Devoid on: 04/14/2021 07:43 AM   Modules accepted: Orders

## 2021-04-14 NOTE — Telephone Encounter (Signed)
Rx refilled as requested.  

## 2021-04-28 ENCOUNTER — Ambulatory Visit: Payer: BLUE CROSS/BLUE SHIELD | Admitting: Family Medicine

## 2021-05-04 ENCOUNTER — Ambulatory Visit (INDEPENDENT_AMBULATORY_CARE_PROVIDER_SITE_OTHER): Payer: BLUE CROSS/BLUE SHIELD | Admitting: Family Medicine

## 2021-05-04 ENCOUNTER — Encounter: Payer: Self-pay | Admitting: Family Medicine

## 2021-05-04 ENCOUNTER — Other Ambulatory Visit: Payer: Self-pay

## 2021-05-04 VITALS — BP 128/78 | HR 125 | Temp 99.7°F | Ht 62.0 in | Wt 168.0 lb

## 2021-05-04 DIAGNOSIS — F1029 Alcohol dependence with unspecified alcohol-induced disorder: Secondary | ICD-10-CM

## 2021-05-04 DIAGNOSIS — K769 Liver disease, unspecified: Secondary | ICD-10-CM

## 2021-05-04 DIAGNOSIS — M545 Low back pain, unspecified: Secondary | ICD-10-CM | POA: Diagnosis not present

## 2021-05-04 DIAGNOSIS — G8929 Other chronic pain: Secondary | ICD-10-CM

## 2021-05-04 MED ORDER — GABAPENTIN 300 MG PO CAPS: 300.0000 mg | ORAL_CAPSULE | Freq: Three times a day (TID) | ORAL | 0 refills | Status: DC | PRN

## 2021-05-04 NOTE — Patient Instructions (Signed)
You should contact gastroenterology in regards to rescheduling your MRI.  The MRI was ordered after your CT in May had a lesion in your liver.

## 2021-05-04 NOTE — Progress Notes (Signed)
Subjective:    Patient ID: Kelly Patton, female    DOB: [birth year], 50 y.o.   MRN: [id number]  Chief Complaint  Patient presents with   Back Pain    Sx 1 month     HPI Patient was seen today for chronic back pain.  Endorses h/o back injury several yrs ago.  Increased midline low back pain with intermittent radiation to b/l low back.  Denies numbness, tingling in LEs.  At times pain so intense she is unable to go to work.  Tried OTC meds, ice, heat, muscle relaxer, and tramadol without relief.    Pt endorses drinking EtOH today as it is her day off. States she is drinking less.  Pt had a CT abd ordered by GI.  Advised to have an MRI to further evaluate hepatic lesion.  Past Medical History:  Diagnosis Date   Allergies    Anxiety    Arthritis    Carpal tunnel syndrome, right    Depression    GERD (gastroesophageal reflux disease)    Hypertension    Shortness of breath dyspnea    with exertion uses inhaler    No Known Allergies  ROS General: Denies fever, chills, night sweats, changes in weight, changes in appetite HEENT: Denies headaches, ear pain, changes in vision, rhinorrhea, sore throat CV: Denies CP, palpitations, SOB, orthopnea Pulm: Denies SOB, cough, wheezing GI: Denies abdominal pain, nausea, vomiting, diarrhea, constipation GU: Denies dysuria, hematuria, frequency, vaginal discharge Msk: Denies muscle cramps, joint pains +low back pain Neuro: Denies weakness, numbness, tingling Skin: Denies rashes, bruising Psych: Denies depression, anxiety, hallucinations     Objective:    Blood pressure 128/78, pulse (!) 125, temperature 99.7 F (37.6 C), temperature source Oral, height 5\' 2"  (1.575 m), weight 168 lb (76.2 kg), last menstrual period 04/20/2021, SpO2 96 %.  Gen. Pleasant, well-nourished, in no distress, normal affect   HEENT: [place]/AT, face symmetric, conjunctiva clear, no scleral icterus, PERRLA, EOMI, nares patent without drainage Lungs: no  accessory muscle use Cardiovascular: Tachycardia, no m/r/g, no peripheral edema Musculoskeletal: No deformities, no cyanosis or clubbing, normal tone Neuro:  A&Ox3, CN II-XII intact, normal gait Skin:  Warm, no lesions/ rash  Wt Readings from Last 3 Encounters:  05/04/21 168 lb (76.2 kg)  03/22/21 165 lb (74.8 kg)  01/06/21 172 lb 8 oz (78.2 kg)    Lab Results  Component Value Date   WBC 6.4 03/22/2021   HGB 13.1 03/22/2021   HCT 37.6 03/22/2021   PLT 436.0 (H) 03/22/2021   GLUCOSE 83 03/22/2021   CHOL 264 (H) 01/06/2020   TRIG (H) 01/06/2020    462.0 Triglyceride is over 400; calculations on Lipids are invalid.   HDL 54.60 01/06/2020   LDLDIRECT 120.0 01/06/2020   ALT 12 03/22/2021   AST 11 03/22/2021   NA 139 03/22/2021   K 3.5 03/22/2021   CL 102 03/22/2021   CREATININE 0.71 03/22/2021   BUN 18 03/22/2021   CO2 28 03/22/2021   TSH 0.82 04/07/2020   INR 0.9 04/07/2020   HGBA1C 5.5 01/06/2020    Assessment/Plan:  Chronic midline low back pain without sciatica  -discussed possible causes including arthritis, disc herniation, bones spurs, etc -continue supportive care: rest, stretching, heat, ice, etc -advised on imaging - Plan: Ambulatory referral to Orthopedic Surgery, gabapentin (NEURONTIN) 300 MG capsule  Alcohol dependence with unspecified alcohol-induced disorder (HCC)  -CT ab pelvis w contrast 01/16/21 with lesion of left lobe of liver and small lesion laterally.  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  Multiple ill-defined stable lesions throughout the liver noted.  Hepatic steatosis and fibroid uterus also noted. -Patient strongly encouraged to wean drinking.  Advised against abrupt cessation. - Plan: CMP, CMP  Hepatic lesion -Noted on CT abdomen pelvis with contrast 01/16/2021.  Lesion of left lobe of liver 1.9 x 1.3 cm.  Additional smaller lesion just laterally measuring 0.7 cm.  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  Multiple ill-defined stable lesions throughout the liver -Per chart review GI made attempts to contact patient.  Patient advised to contact GI office in regards to rescheduling MRI liver with without contrast.  F/u as needed  Abbe Amsterdam, MD

## 2021-05-05 ENCOUNTER — Other Ambulatory Visit: Payer: Self-pay | Admitting: Family Medicine

## 2021-05-05 DIAGNOSIS — E876 Hypokalemia: Secondary | ICD-10-CM

## 2021-05-05 LAB — COMPREHENSIVE METABOLIC PANEL
ALT: 17 U/L (ref 0–35)
AST: 16 U/L (ref 0–37)
Albumin: 4.3 g/dL (ref 3.5–5.2)
Alkaline Phosphatase: 53 U/L (ref 39–117)
BUN: 15 mg/dL (ref 6–23)
CO2: 26 mEq/L (ref 19–32)
Calcium: 10.3 mg/dL (ref 8.4–10.5)
Chloride: 101 mEq/L (ref 96–112)
Creatinine, Ser: 0.68 mg/dL (ref 0.40–1.20)
GFR: 101.23 mL/min (ref >60.00)
Glucose, Bld: 112 mg/dL — ABNORMAL HIGH (ref 70–99)
Potassium: 3.1 mEq/L — ABNORMAL LOW (ref 3.5–5.1)
Sodium: 139 mEq/L (ref 135–145)
Total Bilirubin: 0.4 mg/dL (ref 0.2–1.2)
Total Protein: 7.4 g/dL (ref 6.0–8.3)

## 2021-05-05 MED ORDER — POTASSIUM CHLORIDE CRYS ER 20 MEQ PO TBCR: EXTENDED_RELEASE_TABLET | ORAL | 0 refills | Status: DC

## 2021-05-09 ENCOUNTER — Ambulatory Visit: Payer: BLUE CROSS/BLUE SHIELD | Admitting: Orthopaedic Surgery

## 2021-05-31 ENCOUNTER — Ambulatory Visit: Payer: BLUE CROSS/BLUE SHIELD | Admitting: Orthopaedic Surgery

## 2021-06-06 ENCOUNTER — Telehealth: Payer: Self-pay | Admitting: Family Medicine

## 2021-06-06 DIAGNOSIS — M545 Low back pain, unspecified: Secondary | ICD-10-CM

## 2021-06-06 DIAGNOSIS — G8929 Other chronic pain: Secondary | ICD-10-CM

## 2021-06-06 NOTE — Telephone Encounter (Signed)
Patient wants to know if she can get referral to an orthopedic doctor that accepts [employer] with [facility] in [place]     Good callback number is [phone]

## 2021-06-07 NOTE — Telephone Encounter (Signed)
Referral placed for [facility] for insurance purposes.

## 2021-07-12 ENCOUNTER — Encounter: Payer: Self-pay | Admitting: Family Medicine

## 2021-07-12 ENCOUNTER — Ambulatory Visit (INDEPENDENT_AMBULATORY_CARE_PROVIDER_SITE_OTHER): Payer: Self-pay | Admitting: Family Medicine

## 2021-07-12 ENCOUNTER — Other Ambulatory Visit: Payer: Self-pay

## 2021-07-12 VITALS — BP 118/80 | HR 86 | Temp 98.4°F | Wt 172.2 lb

## 2021-07-12 DIAGNOSIS — J040 Acute laryngitis: Secondary | ICD-10-CM

## 2021-07-12 DIAGNOSIS — N951 Menopausal and female climacteric states: Secondary | ICD-10-CM

## 2021-07-12 DIAGNOSIS — J069 Acute upper respiratory infection, unspecified: Secondary | ICD-10-CM

## 2021-07-12 MED ORDER — BENZONATATE 200 MG PO CAPS: 200.0000 mg | ORAL_CAPSULE | Freq: Two times a day (BID) | ORAL | 0 refills | Status: DC | PRN

## 2021-07-12 MED ORDER — GABAPENTIN 300 MG PO CAPS: 300.0000 mg | ORAL_CAPSULE | Freq: Three times a day (TID) | ORAL | 0 refills | Status: DC | PRN

## 2021-07-12 MED ORDER — FEXOFENADINE HCL 180 MG PO TABS: 180.0000 mg | ORAL_TABLET | Freq: Every day | ORAL | 1 refills | Status: DC

## 2021-07-12 NOTE — Progress Notes (Signed)
Subjective:    Patient ID: Kelly Patton, female    DOB: 21-Jun-1971, 50 y.o.   MRN: [id number]  Chief Complaint  Patient presents with   Hot Flashes    Going on about 2 months, started off light but now they are really bad. Stated she feels like she is one giant flame.     HPI Patient was seen today for acute concern.  Pt with nasal congestion, HA, body aches, sore throat, layngitis, and cough x 9 days.  States occurs every yr this time.  Patient tried OTC cough/cold medications.  Denies fever, nausea, vomiting, ear pain/pressure, facial pain/pressure.  Patient also endorses increased hot flashes during the day and at night.  Patient has prescription for gabapentin for back pain as needed.  Past Medical History:  Diagnosis Date   Allergies    Anxiety    Arthritis    Carpal tunnel syndrome, right    Depression    GERD (gastroesophageal reflux disease)    Hypertension    Shortness of breath dyspnea    with exertion uses inhaler    No Known Allergies  ROS General: Denies fever, chills, night sweats, changes in weight, changes in appetite + body aches, hot flash HEENT: Denies ear pain, changes in vision + headache, sore throat, laryngitis, rhinorrhea, nasal congestion CV: Denies CP, palpitations, SOB, orthopnea Pulm: Denies SOB, wheezing + cough GI: Denies abdominal pain, nausea, vomiting, diarrhea, constipation GU: Denies dysuria, hematuria, frequency, vaginal discharge Msk: Denies muscle cramps, joint pains Neuro: Denies weakness, numbness, tingling Skin: Denies rashes, bruising Psych: Denies depression, anxiety, hallucinations     Objective:    Blood pressure 118/80, pulse 86, temperature 98.4 F (36.9 C), temperature source Oral, weight 172 lb 3.2 oz (78.1 kg), SpO2 95 %.  Gen. Pleasant, well-nourished, in no distress, normal affect   HEENT: [place]/AT, face symmetric, conjunctiva clear, no scleral icterus, PERRLA, EOMI, nares patent without drainage, pharynx  without erythema or exudate.  TMs normal bilaterally.  No cervical lymphadenopathy. Lungs: no accessory muscle use, CTAB, no wheezes or rales Cardiovascular: RRR, no m/r/g, no peripheral edema Musculoskeletal: No deformities, no cyanosis or clubbing, normal tone Neuro:  A&Ox3, CN II-XII intact, normal gait Skin:  Warm, no lesions/ rash  Wt Readings from Last 3 Encounters:  07/12/21 172 lb 3.2 oz (78.1 kg)  05/04/21 168 lb (76.2 kg)  03/22/21 165 lb (74.8 kg)    Lab Results  Component Value Date   WBC 6.4 03/22/2021   HGB 13.1 03/22/2021   HCT 37.6 03/22/2021   PLT 436.0 (H) 03/22/2021   GLUCOSE 112 (H) 05/04/2021   CHOL 264 (H) 01/06/2020   TRIG (H) 01/06/2020    462.0 Triglyceride is over 400; calculations on Lipids are invalid.   HDL 54.60 01/06/2020   LDLDIRECT 120.0 01/06/2020   ALT 17 05/04/2021   AST 16 05/04/2021   NA 139 05/04/2021   K 3.1 (L) 05/04/2021   CL 101 05/04/2021   CREATININE 0.68 05/04/2021   BUN 15 05/04/2021   CO2 26 05/04/2021   TSH 0.82 04/07/2020   INR 0.9 04/07/2020   HGBA1C 5.5 01/06/2020    Assessment/Plan:  Laryngitis -Likely 2/2 postnasal drainage and URI -Supportive care including vocal rest, warm fluids, gargling with warm salt water or Chloraseptic spray  Viral URI with cough  -Continue supportive care including OTC cough/cold medications, rest, hydration, gargling with warm salt water or Chloraseptic spray -Given precautions -We will start Allegra and Tessalon as needed - Plan: benzonatate (  TESSALON) 200 MG capsule, fexofenadine (ALLEGRA) 180 MG tablet  Hot flashes due to menopause  -Discussed supportive care -Patient to take gabapentin 300 mg nightly as causes drowsiness to see if she notices improvement in symptoms.  Has prescription for gabapentin for back pain.  Will provide refill. - Plan: gabapentin (NEURONTIN) 300 MG capsule  F/u as needed  Abbe Amsterdam, MD

## 2021-07-31 ENCOUNTER — Emergency Department (HOSPITAL_COMMUNITY)
Admission: EM | Admit: 2021-07-31 | Discharge: 2021-07-31 | Disposition: A | Discharge status: Home / Self Care | Payer: Self-pay | Attending: Emergency Medicine | Admitting: Emergency Medicine

## 2021-07-31 ENCOUNTER — Encounter (HOSPITAL_COMMUNITY): Payer: Self-pay | Admitting: Emergency Medicine

## 2021-07-31 ENCOUNTER — Emergency Department (HOSPITAL_COMMUNITY): Payer: Self-pay

## 2021-07-31 DIAGNOSIS — I10 Essential (primary) hypertension: Secondary | ICD-10-CM | POA: Insufficient documentation

## 2021-07-31 DIAGNOSIS — F17210 Nicotine dependence, cigarettes, uncomplicated: Secondary | ICD-10-CM | POA: Insufficient documentation

## 2021-07-31 DIAGNOSIS — J209 Acute bronchitis, unspecified: Secondary | ICD-10-CM | POA: Insufficient documentation

## 2021-07-31 DIAGNOSIS — Z20822 Contact with and (suspected) exposure to covid-19: Secondary | ICD-10-CM | POA: Insufficient documentation

## 2021-07-31 LAB — RESP PANEL BY RT-PCR (FLU A&B, COVID) ARPGX2
Influenza A by PCR: NEGATIVE
Influenza B by PCR: NEGATIVE
SARS Coronavirus 2 by RT PCR: NEGATIVE

## 2021-07-31 MED ORDER — DEXTROMETHORPHAN HBR 15 MG/5ML PO SYRP: 10.0000 mL | ORAL_SOLUTION | Freq: Four times a day (QID) | ORAL | 0 refills | Status: DC | PRN

## 2021-07-31 MED ORDER — GUAIFENESIN-DM 100-10 MG/5ML PO SYRP
5.0000 mL | ORAL_SOLUTION | ORAL | Status: DC | PRN
  Administered 2021-07-31: 5 mL via ORAL
  Filled 2021-07-31: qty 5

## 2021-07-31 MED ORDER — DEXAMETHASONE SODIUM PHOSPHATE 10 MG/ML IJ SOLN
10.0000 mg | Freq: Once | INTRAMUSCULAR | Status: AC
  Administered 2021-07-31: 10 mg via INTRAMUSCULAR
  Filled 2021-07-31: qty 1

## 2021-07-31 MED ORDER — IPRATROPIUM-ALBUTEROL 0.5-2.5 (3) MG/3ML IN SOLN
3.0000 mL | Freq: Once | RESPIRATORY_TRACT | Status: AC
  Administered 2021-07-31: 3 mL via RESPIRATORY_TRACT
  Filled 2021-07-31: qty 3

## 2021-07-31 NOTE — ED Triage Notes (Signed)
Patient complains of a cough for the last two days. Patient alert, oriented, and in no apparent distress at this time.
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
## 2021-07-31 NOTE — ED Provider Notes (Signed)
Emergency Medicine Provider Triage Evaluation Note  Kelly Patton , a 50 y.o. female  was evaluated in triage.  Pt complains of productive cough x phlegm onset 2 days.  She denies sick contacts.  Patient has associated chills.  She has tried over-the-counter medications with no relief of her symptoms.  Patient denies chest pain, shortness of breath, abdominal pain, nausea, vomiting.  Review of Systems  Positive: Cough Negative: Chest pain  Physical Exam  BP (!) 119/92 (BP Location: Left Arm)   Pulse 63   Temp 98.9 F (37.2 C)   Resp 18   SpO2 99%  Gen:   Awake, no distress   Resp:  Normal effort  MSK:   Moves extremities without difficulty   Medical Decision Making  Medically screening exam initiated at 9:40 AM.  Appropriate orders placed.  Kelly Patton was informed that the remainder of the evaluation will be completed by another provider, this initial triage assessment does not replace that evaluation, and the importance of remaining in the ED until their evaluation is complete.    Darrick Greenlaw A, PA-C 07/31/21 0940    Mancel Bale, MD 07/31/21 [phone]

## 2021-07-31 NOTE — Discharge Instructions (Signed)
As we discussed your test results today were negative for COVID, flu I do believe that you have acute bronchitis.  The steroid injection that we gave you should help to resolve your symptoms.  Additionally you can use the Robitussin I am prescribing for symptomatic relief of sore throat, cough.  Please follow-up with your primary care if your symptoms fail to improve, or return to the emergency department if your symptoms worsen.

## 2021-07-31 NOTE — ED Notes (Signed)
Pt given all d/c instructions and prescriptions.  Pt acknowledges her understanding of the instructions and medications

## 2021-08-11 ENCOUNTER — Telehealth (INDEPENDENT_AMBULATORY_CARE_PROVIDER_SITE_OTHER): Payer: Self-pay | Admitting: Family Medicine

## 2021-08-11 ENCOUNTER — Encounter: Payer: Self-pay | Admitting: Family Medicine

## 2021-08-11 DIAGNOSIS — N951 Menopausal and female climacteric states: Secondary | ICD-10-CM

## 2021-08-11 DIAGNOSIS — J40 Bronchitis, not specified as acute or chronic: Secondary | ICD-10-CM

## 2021-08-11 MED ORDER — BENZONATATE 200 MG PO CAPS: 200.0000 mg | ORAL_CAPSULE | Freq: Two times a day (BID) | ORAL | 0 refills | Status: DC | PRN

## 2021-08-11 MED ORDER — AMOXICILLIN-POT CLAVULANATE 500-125 MG PO TABS: 1.0000 | ORAL_TABLET | Freq: Two times a day (BID) | ORAL | 0 refills | Status: AC

## 2021-08-11 MED ORDER — ALBUTEROL SULFATE HFA 108 (90 BASE) MCG/ACT IN AERS: 2.0000 | INHALATION_SPRAY | Freq: Four times a day (QID) | RESPIRATORY_TRACT | 0 refills | Status: DC | PRN

## 2021-08-11 NOTE — Progress Notes (Signed)
Virtual Visit via Telephone Note  I connected with Kelly Patton on 08/11/21 at  4:00 PM EST by telephone and verified that I am speaking with the correct person using two identifiers.   I discussed the limitations, risks, security and privacy concerns of performing an evaluation and management service by telephone and the availability of in person appointments. I also discussed with the patient that there may be a patient responsible charge related to this service. The patient expressed understanding and agreed to proceed.  Location patient: home Location provider: work or home office Participants present for the call: patient, provider Patient did not have a visit in the prior 7 days to address this/these issue(s). No chief complaint on file.   History of Present Illness: Pt states she is still sick. Pt was seen in the ED 07/31/21, received steroid injection for bronchitis.  OTC meds not working, but pt still coughing. Pt states her hot flashes seem to keep making her sick b/c she was going back and forth between using the [facility] and the heat.  Gabapentin didn't help.  Black coash 200 mg.  Patient endorses continued cough, congestion.  Denies fever, chills, nausea, vomiting.   Observations/Objective: Patient sounds cheerful and well on the phone. Dry cough appreciated.  I do not appreciate any SOB. Speech and thought processing are grossly intact. Patient reported vitals: RR between 12-20 bpm  Assessment and Plan: Bronchitis -CXR from ED visit on 11/21 reviewed and negative for acute process. - Plan: albuterol (VENTOLIN HFA) 108 (90 Base) MCG/ACT inhaler, amoxicillin-clavulanate (AUGMENTIN) 500-125 MG tablet, benzonatate (TESSALON) 200 MG capsule -Given strict precautions  Hot flashes due to menopause -Continue OTC black cohosh -We will DC gabapentin 400 mg as ineffective. -Continue to monitor.  Follow Up Instructions: As needed for continued or worsened symptoms   [id number]  5-10 [id number] 11-20 9443 21-30 I did not refer this patient for an OV in the next 24 hours for this/these issue(s).  I discussed the assessment and treatment plan with the patient. The patient was provided an opportunity to ask questions and all were answered. The patient agreed with the plan and demonstrated an understanding of the instructions.   The patient was advised to call back or seek an in-person evaluation if the symptoms worsen or if the condition fails to improve as anticipated.  I provided 7 minutes of non-face-to-face time during this encounter.   [name], MD

## 2021-10-05 ENCOUNTER — Telehealth: Payer: Self-pay | Admitting: Family Medicine

## 2021-10-05 DIAGNOSIS — I10 Essential (primary) hypertension: Secondary | ICD-10-CM

## 2021-10-05 DIAGNOSIS — N951 Menopausal and female climacteric states: Secondary | ICD-10-CM

## 2021-10-05 MED ORDER — GABAPENTIN 300 MG PO CAPS: 300.0000 mg | ORAL_CAPSULE | Freq: Three times a day (TID) | ORAL | 1 refills | Status: DC | PRN

## 2021-10-05 MED ORDER — CHLORTHALIDONE 25 MG PO TABS: 25.0000 mg | ORAL_TABLET | Freq: Every day | ORAL | 1 refills | Status: DC

## 2021-10-05 NOTE — Telephone Encounter (Signed)
Patient called needing refills for gabapentin (NEURONTIN) 300 MG capsule and chlorthalidone (HYGROTON) 25 MG tablet to be sent to [facility] [address] [place] - [address] Marland Kitchen  Please advise.

## 2021-10-10 ENCOUNTER — Telehealth: Payer: Self-pay

## 2021-10-10 ENCOUNTER — Ambulatory Visit (INDEPENDENT_AMBULATORY_CARE_PROVIDER_SITE_OTHER): Payer: 59 | Admitting: Orthopaedic Surgery

## 2021-10-10 ENCOUNTER — Other Ambulatory Visit: Payer: Self-pay

## 2021-10-10 DIAGNOSIS — M65312 Trigger thumb, left thumb: Secondary | ICD-10-CM | POA: Diagnosis not present

## 2021-10-10 MED ORDER — TRAMADOL HCL 50 MG PO TABS: 50.0000 mg | ORAL_TABLET | Freq: Two times a day (BID) | ORAL | 2 refills | Status: DC | PRN

## 2021-10-10 NOTE — Telephone Encounter (Signed)
[facility] is calling and asking about the tramadol that was sent in they need to know if the issue is chronic pain condition. And if it is there needs to be a prior auth sent in as well she stated that she sent in the paper work already.
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
## 2021-10-11 MED ORDER — BUPIVACAINE HCL 0.25 % IJ SOLN
0.3300 mL | INTRAMUSCULAR | Status: AC | PRN
  Administered 2021-10-10: .33 mL

## 2021-10-11 MED ORDER — LIDOCAINE HCL 1 % IJ SOLN
1.0000 mL | INTRAMUSCULAR | Status: AC | PRN
  Administered 2021-10-10: 1 mL

## 2021-10-11 MED ORDER — METHYLPREDNISOLONE ACETATE 40 MG/ML IJ SUSP
13.3300 mg | INTRAMUSCULAR | Status: AC | PRN
  Administered 2021-10-10: 13.33 mg

## 2021-10-11 NOTE — Telephone Encounter (Signed)
Called pharm. States they need a ICD code. Code given.

## 2021-10-13 ENCOUNTER — Telehealth: Payer: Self-pay | Admitting: Orthopaedic Surgery

## 2021-10-13 NOTE — Telephone Encounter (Signed)
Patient called advised Rx Tramadol was sent to the wrong pharmacy. Patient asked if the Rx can be sent to the [facility] at [address] Patient said the [facility] is closer to her job. Patient asked that all the other pharmacies be removed from her chart. The number to contact patient is   [phone] °

## 2021-10-16 ENCOUNTER — Other Ambulatory Visit: Payer: Self-pay | Admitting: Surgical

## 2021-10-16 ENCOUNTER — Telehealth: Payer: Self-pay | Admitting: Orthopaedic Surgery

## 2021-10-16 MED ORDER — TRAMADOL HCL 50 MG PO TABS: 50.0000 mg | ORAL_TABLET | Freq: Two times a day (BID) | ORAL | 0 refills | Status: DC | PRN

## 2021-10-16 NOTE — Telephone Encounter (Signed)
Sent in prescription for tramadol.  Presented to the correct pharmacy and took out all the other pharmacies as far as the favorites go.  Left a voicemail message on her phone.

## 2021-10-16 NOTE — Telephone Encounter (Signed)
Patient called advised Rx Tramadol was sent to the wrong pharmacy. Patient asked if the Rx can be sent to the [facility]. Patient said the [facility] is closer to her job. Patient asked that all the other pharmacies be removed from her chart. The number to contact patient is   [phone]

## 2021-10-18 ENCOUNTER — Encounter: Payer: Self-pay | Admitting: Family Medicine

## 2021-10-18 ENCOUNTER — Ambulatory Visit (INDEPENDENT_AMBULATORY_CARE_PROVIDER_SITE_OTHER): Payer: 59 | Admitting: Family Medicine

## 2021-10-18 VITALS — BP 117/79 | HR 82 | Resp 16 | Ht 62.0 in | Wt 172.0 lb

## 2021-10-18 DIAGNOSIS — F321 Major depressive disorder, single episode, moderate: Secondary | ICD-10-CM

## 2021-10-18 DIAGNOSIS — E782 Mixed hyperlipidemia: Secondary | ICD-10-CM | POA: Diagnosis not present

## 2021-10-18 DIAGNOSIS — F1029 Alcohol dependence with unspecified alcohol-induced disorder: Secondary | ICD-10-CM | POA: Diagnosis not present

## 2021-10-18 DIAGNOSIS — R69 Illness, unspecified: Secondary | ICD-10-CM | POA: Diagnosis not present

## 2021-10-18 DIAGNOSIS — L299 Pruritus, unspecified: Secondary | ICD-10-CM

## 2021-10-18 DIAGNOSIS — Z03818 Encounter for observation for suspected exposure to other biological agents ruled out: Secondary | ICD-10-CM

## 2021-10-18 LAB — CBC WITH DIFFERENTIAL/PLATELET
Basophils Absolute: 0 10*3/uL (ref 0.0–0.1)
Basophils Relative: 0.4 % (ref 0.0–3.0)
Eosinophils Absolute: 0 10*3/uL (ref 0.0–0.7)
Eosinophils Relative: 0.8 % (ref 0.0–5.0)
HCT: 40.5 % (ref 36.0–46.0)
Hemoglobin: 13.2 g/dL (ref 12.0–15.0)
Lymphocytes Relative: 41.4 % (ref 12.0–46.0)
Lymphs Abs: 2.2 10*3/uL (ref 0.7–4.0)
MCHC: 32.6 g/dL (ref 30.0–36.0)
MCV: 90.4 fl (ref 78.0–100.0)
Monocytes Absolute: 0.7 10*3/uL (ref 0.1–1.0)
Monocytes Relative: 12.3 % — ABNORMAL HIGH (ref 3.0–12.0)
Neutro Abs: 2.4 10*3/uL (ref 1.4–7.7)
Neutrophils Relative %: 45.1 % (ref 43.0–77.0)
Platelets: 414 10*3/uL — ABNORMAL HIGH (ref 150.0–400.0)
RBC: 4.48 Mil/uL (ref 3.87–5.11)
RDW: 13 % (ref 11.5–15.5)
WBC: 5.3 10*3/uL (ref 4.0–10.5)

## 2021-10-18 LAB — LIPID PANEL
Cholesterol: 176 mg/dL (ref 0–200)
HDL: 72.2 mg/dL (ref >39.00)
Total CHOL/HDL Ratio: 2
Triglycerides: 417 mg/dL — ABNORMAL HIGH (ref 0.0–149.0)

## 2021-10-18 LAB — COMPREHENSIVE METABOLIC PANEL
ALT: 25 U/L (ref 0–35)
AST: 21 U/L (ref 0–37)
Albumin: 4.3 g/dL (ref 3.5–5.2)
Alkaline Phosphatase: 60 U/L (ref 39–117)
BUN: 18 mg/dL (ref 6–23)
CO2: 29 mEq/L (ref 19–32)
Calcium: 10.4 mg/dL (ref 8.4–10.5)
Chloride: 97 mEq/L (ref 96–112)
Creatinine, Ser: 0.87 mg/dL (ref 0.40–1.20)
GFR: 77.19 mL/min (ref >60.00)
Glucose, Bld: 98 mg/dL (ref 70–99)
Potassium: 4 mEq/L (ref 3.5–5.1)
Sodium: 135 mEq/L (ref 135–145)
Total Bilirubin: 0.4 mg/dL (ref 0.2–1.2)
Total Protein: 7.8 g/dL (ref 6.0–8.3)

## 2021-10-18 LAB — FOLATE: Folate: 14.4 ng/mL (ref >5.9)

## 2021-10-18 LAB — T4, FREE: Free T4: 0.69 ng/dL (ref 0.60–1.60)

## 2021-10-18 LAB — TSH: TSH: 0.44 u[IU]/mL (ref 0.35–5.50)

## 2021-10-18 LAB — LDL CHOLESTEROL, DIRECT: Direct LDL: 68 mg/dL

## 2021-10-18 LAB — VITAMIN B12: Vitamin B-12: 356 pg/mL (ref 211–911)

## 2021-10-18 NOTE — Patient Instructions (Signed)
Behavioral Health Services: -to make an appointment contact the office/provider you are interested in seeing.  No referral is needed.  The below is not an all inclusive list, but will help you get started.  Dr. Darleene Cleaver is a Psychiatrist with [facility]. [phone]  [place]  [phone] -a place in town that has counseling and Psychiatry services.

## 2021-10-18 NOTE — Progress Notes (Signed)
Subjective:    Patient ID: Kelly Patton, female    DOB: 1971/02/13, 51 y.o.   MRN: [id number]  Chief Complaint  Patient presents with   Shoulder Pain    Shoulder pain x 3 months,  Question regarding hair Stomach issues with abdominal pain Chest pains randomly a few times  DECLINES VACCINES     HPI Patient was seen today for several ongoing concerns.  Pt drinking 1 pint of liquor per day.  Drinking before work, at work, and throughout the evening.  Is not interested in quitting.  Headed to liquor store after visit.  Was in rehab before, but felt like the ppl there had real problems and she was taking up a spot.  Denies withdrawal symptoms if does not drink over the weekend when she does not have any liquor.  Pt concerned she has a parasite crawling in her scalp.  States looked online and has several symptoms of a parasitic infection including frequent stools, abd bloating, pruritis.  Pt also notes seeing red spots of her skin, worse when she is hot/has a hot flash.  Pt endorses hearing a white noise/static type sound all the time.  Pt endorses feeling depressed.  Denies SI/HI, current substance abuse.  Interested in counseling.  States the movement in scalp is "driving me crazy".  Past Medical History:  Diagnosis Date   Allergies    Anxiety    Arthritis    Carpal tunnel syndrome, right    Depression    GERD (gastroesophageal reflux disease)    Hypertension    Shortness of breath dyspnea    with exertion uses inhaler    No Known Allergies  ROS General: Denies fever, chills, night sweats, changes in weight, changes in appetite HEENT: Denies headaches, ear pain, changes in vision, rhinorrhea, sore throat CV: Denies CP, palpitations, SOB, orthopnea Pulm: Denies SOB, cough, wheezing GI: Denies abdominal pain, nausea, vomiting, diarrhea, constipation + abdominal bloating, frequent stools GU: Denies dysuria, hematuria, frequency, vaginal discharge Msk: Denies muscle  cramps, joint pains Neuro: Denies weakness, numbness, tingling Skin: Denies rashes, bruising  + intermittent rash Psych: Denies depression, anxiety, hallucinations+ depression     Objective:    Blood pressure 117/79, pulse 82, resp. rate 16, height 5\' 2"  (1.575 m), weight 172 lb (78 kg).  Gen. Pleasant, well-nourished, in no distress, normal affect   HEENT: [place]/AT, face symmetric, conjunctiva clear, no scleral icterus, PERRLA, EOMI, nares patent without drainage, pharynx without erythema or exudate. Lungs: no accessory muscle use, CTAB, no wheezes or rales Cardiovascular: RRR, no m/r/g, no peripheral edema Abdomen: BS present, soft, NT, distended. Musculoskeletal: No deformities, no cyanosis or clubbing, normal tone Neuro:  A&Ox3, CN II-XII intact, normal gait.  No asterixis. Skin:  Warm, no lesions/ rash   Wt Readings from Last 3 Encounters:  10/18/21 172 lb (78 kg)  07/12/21 172 lb 3.2 oz (78.1 kg)  05/04/21 168 lb (76.2 kg)    Lab Results  Component Value Date   WBC 6.4 03/22/2021   HGB 13.1 03/22/2021   HCT 37.6 03/22/2021   PLT 436.0 (H) 03/22/2021   GLUCOSE 112 (H) 05/04/2021   CHOL 264 (H) 01/06/2020   TRIG (H) 01/06/2020    462.0 Triglyceride is over 400; calculations on Lipids are invalid.   HDL 54.60 01/06/2020   LDLDIRECT 120.0 01/06/2020   ALT 17 05/04/2021   AST 16 05/04/2021   NA 139 05/04/2021   K 3.1 (L) 05/04/2021   CL 101 05/04/2021  CREATININE 0.68 05/04/2021   BUN 15 05/04/2021   CO2 26 05/04/2021   TSH 0.82 04/07/2020   INR 0.9 04/07/2020   HGBA1C 5.5 01/06/2020   Depression screen [facility] 2/9 10/18/2021 07/27/2020 06/04/2017  Decreased Interest 1 2 0  Down, Depressed, Hopeless 1 0 0  PHQ - 2 Score 2 2 0  Altered sleeping 3 3 -  Tired, decreased energy 3 2 -  Change in appetite 3 0 -  Feeling bad or failure about yourself  1 0 -  Trouble concentrating 2 1 -  Moving slowly or fidgety/restless 0 0 -  Suicidal thoughts 1 0 -  PHQ-9 Score 15 8 -   Difficult doing work/chores - Somewhat difficult -   [place] Visit from 10/18/2021 in [facility] [place]  AUDIT-C Score 11       [place] Visit from 10/18/2021 in [facility] [place]  Alcohol Use Disorder Identification Test Final Score (AUDIT) 20        Assessment/Plan:  Alcohol dependence with unspecified alcohol-induced disorder (HCC) -Audit score 20 -Alcohol abuse counseling less than 30 minutes. -Discussed rehab.  Patient declines at this time as she is not ready to stop drinking. -Discussed cutting down on drinking and the dangers of quitting cold [place]. -Given information about area [place] providers - Plan: CMP, Folate, Vitamin B12, CBC with Differential/Platelet  Pruritus  - Plan: CMP, TSH, T4, Free  Encounter for patient concern about exposure to infectious organism -Ongoing concern -Patient concerned about parasitic infection.  Will obtain stool studies. -For continued or worsening symptoms consider ID consult - Plan: Stool culture, Ova and Parasite Examination  Mixed hyperlipidemia -Discussed lifestyle modifications - Plan: Lipid panel  Depression, major, single episode, moderate (HCC) -PHQ-9 score 15 this visit.  Patient scored a 1 on suicidal thoughts, however states she was herself and would not harm herself. -Discussed therapy and medication options.  Patient interested in psychiatry. -Given handout for area [facility] providers.  Patient encouraged to schedule appointment. -Given strict precautions  F/u in 1 month, sooner if needed  More than 50% of over 35 minutes spent in total in caring for this patient was spent face-to-face, reviewing the chart, counseling and/or coordinating care.    Grier Mitts, MD

## 2021-10-20 ENCOUNTER — Telehealth: Payer: Self-pay | Admitting: Family Medicine

## 2021-10-20 LAB — OVA AND PARASITE EXAMINATION
CONCENTRATE RESULT:: NONE SEEN
MICRO NUMBER:: 12981024
SPECIMEN QUALITY:: ADEQUATE
TRICHROME RESULT:: NONE SEEN

## 2021-10-20 NOTE — Telephone Encounter (Signed)
Pt is calling back

## 2021-10-20 NOTE — Telephone Encounter (Signed)
Patient called to follow up on missed call for lab results. I let her know that [facility] was unavailable and would give her a call when they were. Patient asked if it will be before five but I told her I did not know but I would send the message back. Pt verbalized understanding.         Please advise

## 2021-10-22 LAB — STOOL CULTURE: E coli, Shiga toxin Assay: NEGATIVE

## 2021-10-23 NOTE — Telephone Encounter (Signed)
Patient notified of update  and verbalized understanding. 

## 2021-11-10 ENCOUNTER — Telehealth: Payer: Self-pay | Admitting: Orthopaedic Surgery

## 2021-11-10 NOTE — Telephone Encounter (Signed)
Please advise 

## 2021-11-10 NOTE — Telephone Encounter (Signed)
Patient called. She would like a refill on Tramadol. Her call back number is [phone] ?

## 2021-11-13 ENCOUNTER — Other Ambulatory Visit: Payer: Self-pay | Admitting: Physician Assistant

## 2021-11-13 MED ORDER — TRAMADOL HCL 50 MG PO TABS: 50.0000 mg | ORAL_TABLET | Freq: Two times a day (BID) | ORAL | 0 refills | Status: DC | PRN

## 2021-11-13 NOTE — Telephone Encounter (Signed)
I called patient and advised. 

## 2021-11-13 NOTE — Telephone Encounter (Signed)
Sent in

## 2021-11-21 ENCOUNTER — Ambulatory Visit (INDEPENDENT_AMBULATORY_CARE_PROVIDER_SITE_OTHER): Payer: 59

## 2021-11-21 ENCOUNTER — Other Ambulatory Visit: Payer: Self-pay

## 2021-11-21 ENCOUNTER — Ambulatory Visit (INDEPENDENT_AMBULATORY_CARE_PROVIDER_SITE_OTHER): Payer: 59 | Admitting: Orthopaedic Surgery

## 2021-11-21 VITALS — Ht 62.5 in | Wt 170.0 lb

## 2021-11-21 DIAGNOSIS — M65312 Trigger thumb, left thumb: Secondary | ICD-10-CM | POA: Diagnosis not present

## 2021-11-21 DIAGNOSIS — M65842 Other synovitis and tenosynovitis, left hand: Secondary | ICD-10-CM | POA: Insufficient documentation

## 2021-11-21 MED ORDER — BUPIVACAINE HCL 0.5 % IJ SOLN
0.3300 mL | INTRAMUSCULAR | Status: AC | PRN
  Administered 2021-11-21: .33 mL

## 2021-11-21 MED ORDER — METHYLPREDNISOLONE ACETATE 40 MG/ML IJ SUSP
13.3300 mg | INTRAMUSCULAR | Status: AC | PRN
  Administered 2021-11-21: 13.33 mg

## 2021-11-21 MED ORDER — LIDOCAINE HCL 1 % IJ SOLN
0.3000 mL | INTRAMUSCULAR | Status: AC | PRN
  Administered 2021-11-21: .3 mL

## 2021-11-21 NOTE — Progress Notes (Signed)
? ?Office Visit Note ?  ?Patient: Kelly Patton           ?Date of Birth: 06/07/1971           ?MRN: [id number] ?Visit Date: 11/21/2021 ?             ?Requested by: [name], MD ?[phone] ?[place],  [place] [address] ?PCP: [name], MD ? ? ?Assessment & Plan: ?Visit Diagnoses:  ?1. Trigger finger of left thumb   ? ? ?Plan: [name] returns today for recurrent left trigger thumb.  She has experienced worsening symptoms in the last 2 weeks.  She had an injection at the end of January which really helped but she states that the triggering has returned.  She has not really had a chance to rest the thumb. ? ?Examination of the left thumb shows tenderness at the A1 pulley and triggering with associated pain. ? ?Impression is recurrent left trigger thumb.  We repeated the injection today.  She understands the importance of rest from activity.  We will see her back as needed. ? ?Follow-Up Instructions: No follow-ups on file.  ? ?Orders:  ?Orders Placed This Encounter  ?Procedures  ? XR Finger Thumb Left  ? ?No orders of the defined types were placed in this encounter. ? ? ? ? Procedures: ?Hand/UE Inj: L thumb A1 for trigger finger on 11/21/2021 9:14 AM ?Indications: pain ?Details: 25 G needle ?Medications: 0.3 mL lidocaine 1 %; 0.33 mL bupivacaine 0.5 %; 13.33 mg methylPREDNISolone acetate 40 MG/ML ?Outcome: tolerated well, no immediate complications ?Consent was given by the patient. Patient was prepped and draped in the usual sterile fashion.  ? ? ? ? ?Clinical Data: ?No additional findings. ? ? ?Subjective: ?Chief Complaint  ?Patient presents with  ? Left Hand - Pain  ? ? ?HPI ? ?Review of Systems ? ? ?Objective: ?Vital Signs: Ht 5' 2.5" (1.588 m)   Wt 170 lb (77.1 kg)   BMI 30.60 kg/m?  ? ?Physical Exam ? ?Ortho Exam ? ?Specialty Comments:  ?No specialty comments available. ? ?Imaging: ?No results found. ? ? ?PMFS History: ?Patient Active Problem List  ? Diagnosis Date Noted  ? Trigger  finger of left thumb 11/21/2021  ? PTSD (post-traumatic stress disorder) 12/28/2020  ? Substance abuse (HCC) 12/16/2020  ? Alcohol dependence with unspecified alcohol-induced disorder (HCC) 12/16/2020  ? Left carpal tunnel syndrome 08/10/2020  ? Primary osteoarthritis of first carpometacarpal joint of left hand 08/10/2020  ? History of 2019 novel coronavirus disease (COVID-19) 07/19/2020  ? Arthritis 07/19/2020  ? Lumbar back pain 11/11/2019  ? Trigger thumb, right thumb 12/09/2017  ? Essential hypertension 07/17/2017  ? Primary insomnia 07/17/2017  ? Thumb pain, right 04/08/2017  ? ?Past Medical History:  ?Diagnosis Date  ? Allergies   ? Anxiety   ? Arthritis   ? Carpal tunnel syndrome, right   ? Depression   ? GERD (gastroesophageal reflux disease)   ? Hypertension   ? Shortness of breath dyspnea   ? with exertion uses inhaler  ?  ?Family History  ?Problem Relation Age of Onset  ? Hypertension Father   ? Hypertension Brother   ? Hypertension Paternal Grandmother   ? Hypertension Paternal Grandfather   ? Alcoholism Paternal Grandfather   ?  ?Past Surgical History:  ?Procedure Laterality Date  ? CARPAL TUNNEL RELEASE Right 01/31/2016  ? Procedure: CARPAL TUNNEL RELEASE;  Surgeon: Cammy Copa, MD;  Location: [facility] OR;  Service: Orthopedics;  Laterality: Right;  ? FINGER SURGERY Right   ? ring finger, due to knife cut  ? HAND TENDON SURGERY Right   ? TRIGGER FINGER RELEASE Right 11/26/2017  ? Procedure: RELEASE TRIGGER FINGER RIGHT THUMB;  Surgeon: Cammy Copa, MD;  Location[facility];  Service: Orthopedics;  Laterality: Right;  ? ?Social History  ? ?Occupational History  ? Occupation: Location manager  ? Occupation: cook   ?  Employer: [employer]  ?Tobacco Use  ? Smoking status: Some Days  ?  Types: Cigarettes  ? Smokeless tobacco: Never  ? Tobacco comments:  ?  Smokes 1 cigarette/week for 2 years  ?Vaping Use  ? Vaping Use: Never used  ?Substance and Sexual Activity  ? Alcohol use: Yes  ?  Comment: once a  week; fifth of liquor  ? Drug use: Yes  ?  Frequency: 1.0 times per week  ?  Types: Marijuana  ?  Comment: daily,last 11/25/17  ? Sexual activity: Yes  ?  Birth control/protection: None  ? ? ? ? ? ? ?

## 2022-01-10 ENCOUNTER — Encounter: Payer: 59 | Admitting: Family Medicine

## 2022-01-11 ENCOUNTER — Other Ambulatory Visit (HOSPITAL_COMMUNITY)
Admission: RE | Admit: 2022-01-11 | Discharge: 2022-01-11 | Disposition: A | Discharge status: Home / Self Care | Payer: 59 | Source: Ambulatory Visit | Attending: Family Medicine | Admitting: Family Medicine

## 2022-01-11 ENCOUNTER — Ambulatory Visit (INDEPENDENT_AMBULATORY_CARE_PROVIDER_SITE_OTHER): Payer: 59 | Admitting: Family Medicine

## 2022-01-11 ENCOUNTER — Encounter: Payer: Self-pay | Admitting: Family Medicine

## 2022-01-11 VITALS — BP 131/92 | HR 114 | Temp 99.1°F | Ht 63.0 in | Wt 168.2 lb

## 2022-01-11 DIAGNOSIS — G4709 Other insomnia: Secondary | ICD-10-CM | POA: Diagnosis not present

## 2022-01-11 DIAGNOSIS — Z1211 Encounter for screening for malignant neoplasm of colon: Secondary | ICD-10-CM

## 2022-01-11 DIAGNOSIS — I10 Essential (primary) hypertension: Secondary | ICD-10-CM | POA: Diagnosis not present

## 2022-01-11 DIAGNOSIS — F1029 Alcohol dependence with unspecified alcohol-induced disorder: Secondary | ICD-10-CM

## 2022-01-11 DIAGNOSIS — Z124 Encounter for screening for malignant neoplasm of cervix: Secondary | ICD-10-CM | POA: Diagnosis not present

## 2022-01-11 DIAGNOSIS — M25512 Pain in left shoulder: Secondary | ICD-10-CM

## 2022-01-11 DIAGNOSIS — Z0000 Encounter for general adult medical examination without abnormal findings: Secondary | ICD-10-CM | POA: Diagnosis not present

## 2022-01-11 DIAGNOSIS — Z113 Encounter for screening for infections with a predominantly sexual mode of transmission: Secondary | ICD-10-CM

## 2022-01-11 DIAGNOSIS — E781 Pure hyperglyceridemia: Secondary | ICD-10-CM

## 2022-01-11 DIAGNOSIS — R69 Illness, unspecified: Secondary | ICD-10-CM | POA: Diagnosis not present

## 2022-01-11 LAB — COMPREHENSIVE METABOLIC PANEL
ALT: 22 U/L (ref 0–35)
AST: 15 U/L (ref 0–37)
Albumin: 4.8 g/dL (ref 3.5–5.2)
Alkaline Phosphatase: 57 U/L (ref 39–117)
BUN: 18 mg/dL (ref 6–23)
CO2: 31 mEq/L (ref 19–32)
Calcium: 10.5 mg/dL (ref 8.4–10.5)
Chloride: 95 mEq/L — ABNORMAL LOW (ref 96–112)
Creatinine, Ser: 0.69 mg/dL (ref 0.40–1.20)
GFR: 100.39 mL/min (ref >60.00)
Glucose, Bld: 96 mg/dL (ref 70–99)
Potassium: 3.7 mEq/L (ref 3.5–5.1)
Sodium: 136 mEq/L (ref 135–145)
Total Bilirubin: 0.6 mg/dL (ref 0.2–1.2)
Total Protein: 8.3 g/dL (ref 6.0–8.3)

## 2022-01-11 LAB — LIPID PANEL
Cholesterol: 257 mg/dL — ABNORMAL HIGH (ref 0–200)
HDL: 75.7 mg/dL (ref >39.00)
NonHDL: 181.25
Total CHOL/HDL Ratio: 3
Triglycerides: 374 mg/dL — ABNORMAL HIGH (ref 0.0–149.0)
VLDL: 74.8 mg/dL — ABNORMAL HIGH (ref 0.0–40.0)

## 2022-01-11 LAB — CBC WITH DIFFERENTIAL/PLATELET
Basophils Absolute: 0 10*3/uL (ref 0.0–0.1)
Basophils Relative: 0.7 % (ref 0.0–3.0)
Eosinophils Absolute: 0.1 10*3/uL (ref 0.0–0.7)
Eosinophils Relative: 1.1 % (ref 0.0–5.0)
HCT: 39.7 % (ref 36.0–46.0)
Hemoglobin: 13.2 g/dL (ref 12.0–15.0)
Lymphocytes Relative: 29 % (ref 12.0–46.0)
Lymphs Abs: 1.9 10*3/uL (ref 0.7–4.0)
MCHC: 33.2 g/dL (ref 30.0–36.0)
MCV: 90.5 fl (ref 78.0–100.0)
Monocytes Absolute: 0.7 10*3/uL (ref 0.1–1.0)
Monocytes Relative: 10.1 % (ref 3.0–12.0)
Neutro Abs: 3.9 10*3/uL (ref 1.4–7.7)
Neutrophils Relative %: 59.1 % (ref 43.0–77.0)
Platelets: 436 10*3/uL — ABNORMAL HIGH (ref 150.0–400.0)
RBC: 4.39 Mil/uL (ref 3.87–5.11)
RDW: 13 % (ref 11.5–15.5)
WBC: 6.7 10*3/uL (ref 4.0–10.5)

## 2022-01-11 LAB — LIPASE: Lipase: <3 U/L — ABNORMAL LOW (ref 11.0–59.0)

## 2022-01-11 LAB — T4, FREE: Free T4: 0.71 ng/dL (ref 0.60–1.60)

## 2022-01-11 LAB — TSH: TSH: 0.56 u[IU]/mL (ref 0.35–5.50)

## 2022-01-11 LAB — HEMOGLOBIN A1C: Hgb A1c MFr Bld: 5.8 % (ref 4.6–6.5)

## 2022-01-11 LAB — LDL CHOLESTEROL, DIRECT: Direct LDL: 132 mg/dL

## 2022-01-11 MED ORDER — CHLORTHALIDONE 25 MG PO TABS: 25.0000 mg | ORAL_TABLET | Freq: Every day | ORAL | 3 refills | Status: DC

## 2022-01-11 NOTE — Progress Notes (Signed)
Subjective:  ?  ? Kelly Patton is a 51 y.o. female and is here for a comprehensive physical exam. The patient reports increased left upper back/shoulder pain and tightness.  Patient states pain/discomfort wakes her up at night.  Patient denies recent injury, swelling, numbness/tingling or weakness in LUE.  Has normal range of motion.  Has not tried anything for symptoms.  Pt endorses continued heavy drinking most nights.  Pt now working at [employer] and [employer].  Pt notes continued insomnia.  Stopped Tylenol PM, now using melatonin.  Patient states she wakes up around 2 AM each night.  On chlorthalidone 25 mg daily for BP.  Not currently exercising.  States loves greasy foods.  Last pap 06/2017. ? ?Social History  ? ?Socioeconomic History  ? Marital status: Single  ?  Spouse name: Not on file  ? Number of children: 0  ? Years of education: Not on file  ? Highest education level: Not on file  ?Occupational History  ? Occupation: Location manager  ? Occupation: cook   ?  Employer: [employer]  ?Tobacco Use  ? Smoking status: Some Days  ?  Types: Cigarettes  ? Smokeless tobacco: Never  ? Tobacco comments:  ?  Smokes 1 cigarette/week for 2 years  ?Vaping Use  ? Vaping Use: Never used  ?Substance and Sexual Activity  ? Alcohol use: Yes  ?  Comment: once a week; fifth of liquor  ? Drug use: Yes  ?  Frequency: 1.0 times per week  ?  Types: Marijuana  ?  Comment: daily,last 11/25/17  ? Sexual activity: Yes  ?  Birth control/protection: None  ?Other Topics Concern  ? Not on file  ?Social History Narrative  ? Not on file  ? ?Social Determinants of Health  ? ?Financial Resource Strain: Not on file  ?Food Insecurity: Not on file  ?Transportation Needs: Not on file  ?Physical Activity: Not on file  ?Stress: Not on file  ?Social Connections: Not on file  ?Intimate Partner Violence: Not on file  ? ?Health Maintenance  ?Topic Date Due  ? COVID-19 Vaccine (1) Never done  ? COLONOSCOPY (Pts 45-24yrs Insurance coverage  will need to be confirmed)  Never done  ? MAMMOGRAM  Never done  ? Zoster Vaccines- Shingrix (1 of 2) Never done  ? PAP SMEAR-Modifier  07/04/2020  ? INFLUENZA VACCINE  04/10/2022  ? TETANUS/TDAP  01/05/2030  ? Hepatitis C Screening  Completed  ? HIV Screening  Completed  ? HPV VACCINES  Aged Out  ? ? ?The following portions of the patient's history were reviewed and updated as appropriate: allergies, current medications, past family history, past medical history, past social history, past surgical history, and problem list. ? ?Review of Systems ?Pertinent items noted in HPI and remainder of comprehensive ROS otherwise negative.  ? ?Objective:  ? ? BP (!) 131/92 (BP Location: Right Arm, Cuff Size: Normal)   Pulse (!) 114   Temp 99.1 ?F (37.3 ?C) (Oral)   Ht 5\' 3"  (1.6 m)   Wt 168 lb 3.2 oz (76.3 kg)   SpO2 95%   BMI 29.80 kg/m?  ?General appearance: alert, cooperative, and no distress ?Head: Normocephalic, without obvious abnormality, atraumatic ?Eyes: conjunctivae/corneas clear. PERRL, EOM's intact. Fundi benign. ?Ears: normal TM's and external ear canals both ears ?Nose: Nares normal. Septum midline. Mucosa normal. No drainage or sinus tenderness. ?Throat: lips, mucosa, and tongue normal; teeth and gums normal dental caries present. ?Neck: no adenopathy, no carotid bruit, no JVD,  supple, symmetrical, trachea midline, and thyroid not enlarged, symmetric, no tenderness/mass/nodules ?Lungs: clear to auscultation bilaterally ?Heart: regular rate and rhythm, S1, S2 normal, no murmur, click, rub or gallop ?Abdomen: soft, non-tender; bowel sounds normal; no masses,  no organomegaly and central obesity ?Pelvic: adnexae not palpable, cervix normal in appearance, external genitalia normal, no adnexal masses or tenderness, no cervical motion tenderness, rectovaginal septum normal, uterus normal size, shape, and consistency, and scant white d/c in vaginal vault.  Pap collected with scant blood noted at os after  collection.  Chaperone present, T. Alveta Heimlich, CMA. ?Extremities: No TTP bilateral shoulders, cervical, thoracic spine.  Normal ROM of bilateral UEs.  Impingement, empty can, Neer's, [place].  Extremities normal, atraumatic, no cyanosis or edema ?Pulses: 2+ and symmetric ?Skin: Skin color, texture, turgor normal. No rashes or lesions ?Lymph nodes: Cervical, supraclavicular, and axillary nodes normal. ?Neurologic: Alert and oriented X 3, normal strength and tone. Normal symmetric reflexes. Normal coordination and gait  ? ? ?  01/11/2022  ?  8:54 AM 10/18/2021  ?  9:26 AM 07/27/2020  ?  2:10 PM  ?Depression screen PHQ 2/9  ?Decreased Interest 0 1 2  ?Down, Depressed, Hopeless 0 1 0  ?PHQ - 2 Score 0 2 2  ?Altered sleeping 3 3 3   ?Tired, decreased energy 0 3 2  ?Change in appetite 0 3 0  ?Feeling bad or failure about yourself  0 1 0  ?Trouble concentrating 0 2 1  ?Moving slowly or fidgety/restless 0 0 0  ?Suicidal thoughts 0 1 0  ?PHQ-9 Score 3 15 8   ?Difficult doing work/chores Somewhat difficult  Somewhat difficult  ?  ?  ?Assessment:  ? ? Healthy female exam with insomnia, left shoulder pain, EtOH abuse. ?  ?Plan:  ? ? Anticipatory guidance given including wearing seatbelts, smoke detectors in the home, increasing physical activity, increasing p.o. intake of water and vegetables. ?-Obtain labs ?-Patient to schedule mammogram ?-Pap collected this visit. ?-Immunizations reviewed ?-Colon cancer screening due.  Patient declines colonoscopy.  We will try Cologuard. ?-Discussed dental and vision screening.  Given handout for area of dental providers. ?-Given handout ?-Next EPM 1 year ?See After Visit Summary for Counseling Recommendations  ? ?Cervical cancer screening  ?- Plan: PAP [[place]] ? ?Alcohol dependence with unspecified alcohol-induced disorder ([place]) ?-Discussed cutting down.  Patient declines at this time. ?-Given handout ?-Continue to monitor at each visit ?- Plan: CMP, CBC with Differential/Platelet,  Lipase ? ?Other insomnia  ?-Likely multifactorial including EtOH dependence, h/o PTSD, etc. ?-Sleep hygiene ?-Continue melatonin for now ?-Encouraged to consider [facility] appointment. ?- Plan: TSH, T4, Free ? ?Colon cancer screening  ?- Plan: Cologuard ? ?Essential hypertension ?-Elevated ?-Repeat BP 131/92 ?-Lifestyle modification strongly encouraged including cutting down on EtOH intake ?-Continue chlorthalidone 25 mg daily ?-Patient encouraged to check BP at home and keep a log to bring with her to clinic ?- Plan: CMP, Lipid panel, chlorthalidone (HYGROTON) 25 MG tablet ? ?Routine screening for STI (sexually transmitted infection)  ?- Plan: RPR, HIV Antibody (routine testing w rflx) ? ?Pure hypertriglyceridemia  ?-Triglycerides 417 on 10/18/2021 ?-Lifestyle modifications ?-Recheck this visit ?-For continued elevation start fenofibrate ?- Plan: Lipid panel ? ?Left shoulder pain, unspecified chronicity ?-Discussed likely causes including muscle strain from overuse.  Also consider arthritis though description sounds more muscular than joint related. ?-Discussed supportive care including topical analgesics, stretching, heat, ice, rest.  Patient declines both options at this time. ?-Continue to monitor ? ?F/u prn ? ?Grier Mitts, MD ? ?

## 2022-01-12 LAB — HIV ANTIBODY (ROUTINE TESTING W REFLEX): HIV 1&2 Ab, 4th Generation: NONREACTIVE

## 2022-01-12 LAB — CYTOLOGY - PAP
Adequacy: ABSENT
Chlamydia: NEGATIVE
Comment: NEGATIVE
Comment: NEGATIVE
Comment: NEGATIVE
Comment: NORMAL
Diagnosis: NEGATIVE
High risk HPV: NEGATIVE
Neisseria Gonorrhea: NEGATIVE
Trichomonas: POSITIVE — AB

## 2022-01-12 LAB — RPR: RPR Ser Ql: NONREACTIVE

## 2022-01-15 ENCOUNTER — Telehealth: Payer: Self-pay

## 2022-01-15 ENCOUNTER — Other Ambulatory Visit: Payer: Self-pay | Admitting: Family Medicine

## 2022-01-15 ENCOUNTER — Other Ambulatory Visit: Payer: Self-pay

## 2022-01-15 DIAGNOSIS — M25511 Pain in right shoulder: Secondary | ICD-10-CM

## 2022-01-15 DIAGNOSIS — A599 Trichomoniasis, unspecified: Secondary | ICD-10-CM

## 2022-01-15 DIAGNOSIS — E781 Pure hyperglyceridemia: Secondary | ICD-10-CM

## 2022-01-15 MED ORDER — METRONIDAZOLE 500 MG PO TABS: 500.0000 mg | ORAL_TABLET | Freq: Two times a day (BID) | ORAL | 0 refills | Status: AC

## 2022-01-15 MED ORDER — CYCLOBENZAPRINE HCL 5 MG PO TABS: 5.0000 mg | ORAL_TABLET | Freq: Every evening | ORAL | 0 refills | Status: DC | PRN

## 2022-01-15 NOTE — Telephone Encounter (Signed)
During result note call, pt states she would like to request a muscle relaxer for the shoulder pain she mentioned during physical on 01/11/22. ?

## 2022-01-19 ENCOUNTER — Emergency Department (HOSPITAL_COMMUNITY): Payer: 59

## 2022-01-19 ENCOUNTER — Other Ambulatory Visit: Payer: Self-pay

## 2022-01-19 ENCOUNTER — Emergency Department (HOSPITAL_COMMUNITY)
Admission: EM | Admit: 2022-01-19 | Discharge: 2022-01-19 | Disposition: A | Discharge status: Home / Self Care | Payer: 59 | Attending: Emergency Medicine | Admitting: Emergency Medicine

## 2022-01-19 ENCOUNTER — Encounter (HOSPITAL_COMMUNITY): Payer: Self-pay

## 2022-01-19 DIAGNOSIS — R1032 Left lower quadrant pain: Secondary | ICD-10-CM | POA: Insufficient documentation

## 2022-01-19 DIAGNOSIS — E876 Hypokalemia: Secondary | ICD-10-CM | POA: Insufficient documentation

## 2022-01-19 DIAGNOSIS — R109 Unspecified abdominal pain: Secondary | ICD-10-CM

## 2022-01-19 DIAGNOSIS — R102 Pelvic and perineal pain: Secondary | ICD-10-CM | POA: Diagnosis not present

## 2022-01-19 DIAGNOSIS — K769 Liver disease, unspecified: Secondary | ICD-10-CM | POA: Diagnosis not present

## 2022-01-19 DIAGNOSIS — N83292 Other ovarian cyst, left side: Secondary | ICD-10-CM | POA: Diagnosis not present

## 2022-01-19 DIAGNOSIS — M545 Low back pain, unspecified: Secondary | ICD-10-CM | POA: Insufficient documentation

## 2022-01-19 DIAGNOSIS — K760 Fatty (change of) liver, not elsewhere classified: Secondary | ICD-10-CM | POA: Diagnosis not present

## 2022-01-19 LAB — URINALYSIS, ROUTINE W REFLEX MICROSCOPIC
Bacteria, UA: NONE SEEN
Bilirubin Urine: NEGATIVE
Glucose, UA: NEGATIVE mg/dL
Hgb urine dipstick: NEGATIVE
Ketones, ur: NEGATIVE mg/dL
Nitrite: NEGATIVE
Protein, ur: NEGATIVE mg/dL
Specific Gravity, Urine: 1.023 (ref 1.005–1.030)
pH: 5 (ref 5.0–8.0)

## 2022-01-19 LAB — CBC
HCT: 36 % (ref 36.0–46.0)
Hemoglobin: 12.1 g/dL (ref 12.0–15.0)
MCH: 30.4 pg (ref 26.0–34.0)
MCHC: 33.6 g/dL (ref 30.0–36.0)
MCV: 90.5 fL (ref 80.0–100.0)
Platelets: 342 10*3/uL (ref 150–400)
RBC: 3.98 MIL/uL (ref 3.87–5.11)
RDW: 11.9 % (ref 11.5–15.5)
WBC: 5.5 10*3/uL (ref 4.0–10.5)
nRBC: 0 % (ref 0.0–0.2)

## 2022-01-19 LAB — COMPREHENSIVE METABOLIC PANEL
ALT: 21 U/L (ref 0–44)
AST: 18 U/L (ref 15–41)
Albumin: 4.1 g/dL (ref 3.5–5.0)
Alkaline Phosphatase: 49 U/L (ref 38–126)
Anion gap: 9 (ref 5–15)
BUN: 14 mg/dL (ref 6–20)
CO2: 28 mmol/L (ref 22–32)
Calcium: 10 mg/dL (ref 8.9–10.3)
Chloride: 101 mmol/L (ref 98–111)
Creatinine, Ser: 0.71 mg/dL (ref 0.44–1.00)
GFR, Estimated: >60 mL/min (ref >60)
Glucose, Bld: 146 mg/dL — ABNORMAL HIGH (ref 70–99)
Potassium: 3.3 mmol/L — ABNORMAL LOW (ref 3.5–5.1)
Sodium: 138 mmol/L (ref 135–145)
Total Bilirubin: 0.9 mg/dL (ref 0.3–1.2)
Total Protein: 7.1 g/dL (ref 6.5–8.1)

## 2022-01-19 LAB — LIPASE, BLOOD: Lipase: 26 U/L (ref 11–51)

## 2022-01-19 LAB — I-STAT BETA HCG BLOOD, ED (MC, WL, AP ONLY): I-stat hCG, quantitative: <5 m[IU]/mL (ref <5)

## 2022-01-19 LAB — PREGNANCY, URINE: Preg Test, Ur: NEGATIVE

## 2022-01-19 MED ORDER — ONDANSETRON HCL 4 MG/2ML IJ SOLN
4.0000 mg | Freq: Once | INTRAMUSCULAR | Status: AC
  Administered 2022-01-19: 4 mg via INTRAVENOUS
  Filled 2022-01-19: qty 2

## 2022-01-19 MED ORDER — MORPHINE SULFATE (PF) 4 MG/ML IV SOLN
4.0000 mg | Freq: Once | INTRAVENOUS | Status: AC
  Administered 2022-01-19: 4 mg via INTRAVENOUS
  Filled 2022-01-19: qty 1

## 2022-01-19 MED ORDER — IOHEXOL 300 MG/ML  SOLN
100.0000 mL | Freq: Once | INTRAMUSCULAR | Status: AC | PRN
  Administered 2022-01-19: 100 mL via INTRAVENOUS

## 2022-01-19 MED ORDER — LACTATED RINGERS IV BOLUS
1000.0000 mL | Freq: Once | INTRAVENOUS | Status: AC
  Administered 2022-01-19: 1000 mL via INTRAVENOUS

## 2022-01-19 NOTE — Discharge Instructions (Signed)
As we discussed, you may have passed a kidney stone.  No kidney stone seen on your CT scan otherwise today.  Your urine test is negative. ?Use Tylenol or ibuprofen as needed for pain follow-up with your doctor.  Return to the ED with worsening pain, fever, vomiting, not able to eat or drink or any other concerns. ?

## 2022-01-19 NOTE — ED Provider Notes (Signed)
?[facility] ?Provider Note ? ? ?CSN: [id number] ?Arrival date & time: 01/19/22  [id number] ? ?  ? ?History ? ?Chief Complaint  ?Patient presents with  ? Abdominal Pain  ? ? ?Kelly Patton is a 51 y.o. female. ? ?Patient complains of 3 days of left-sided abdominal pain coming and going to be becoming more intense over the past 2 days.  Over the past 2 hours she has had constant pain in her left lower quadrant that is sharp and feels like a "contraction".  Did not take anything for it at home.  Pain was coming and going the past several days but now is constant.  Denies pain with urination or blood in urine.  No vaginal bleeding or discharge.  No vomiting or diarrhea.  No change in bowel habits.  No fever.  No history of kidney stones previously.  Still has appendix and gallbladder.  Still has uterus and ovaries. ?Pain starts in her lower abdomen and spreads across her back diffusely.  No radiation of the pain down her legs. ? ?The history is provided by the patient.  ?Abdominal Pain ?Associated symptoms: no chest pain, no cough, no dysuria, no fever, no hematuria, no nausea, no shortness of breath and no vomiting   ? ?  ? ?Home Medications ?Prior to Admission medications   ?Medication Sig Start Date End Date Taking? Authorizing Provider  ?albuterol (VENTOLIN HFA) 108 (90 Base) MCG/ACT inhaler Inhale 2 puffs into the lungs every 6 (six) hours as needed for wheezing or shortness of breath. ?Patient not taking: Reported on 01/11/2022 08/11/21   Billie Ruddy, MD  ?chlorthalidone (HYGROTON) 25 MG tablet Take 1 tablet (25 mg total) by mouth daily. 01/11/22   Billie Ruddy, MD  ?cyclobenzaprine (FLEXERIL) 5 MG tablet Take 1 tablet (5 mg total) by mouth at bedtime as needed for muscle spasms. 01/15/22   Billie Ruddy, MD  ?gabapentin (NEURONTIN) 300 MG capsule Take 1 capsule (300 mg total) by mouth 3 (three) times daily as needed. 10/05/21   Billie Ruddy, MD  ?metroNIDAZOLE  (FLAGYL) 500 MG tablet Take 1 tablet (500 mg total) by mouth 2 (two) times daily for 7 days. 01/15/22 01/22/22  Billie Ruddy, MD  ?   ? ?Allergies    ?Patient has no known allergies.   ? ?Review of Systems   ?Review of Systems  ?Constitutional:  Negative for activity change, appetite change and fever.  ?HENT:  Negative for dental problem and rhinorrhea.   ?Respiratory:  Negative for cough, chest tightness and shortness of breath.   ?Cardiovascular:  Negative for chest pain.  ?Gastrointestinal:  Positive for abdominal pain. Negative for nausea and vomiting.  ?Genitourinary:  Negative for dysuria and hematuria.  ?Musculoskeletal:  Positive for back pain. Negative for arthralgias and myalgias.  ?Skin:  Negative for rash.  ?Neurological:  Negative for dizziness, weakness and headaches.  ? all other systems are negative except as noted in the HPI and PMH.  ? ?Physical Exam ?Updated Vital Signs ?BP 112/74 (BP Location: Right Arm)   Pulse (!) 104   Temp 98.3 ?F (36.8 ?C) (Oral)   Resp 14   Ht 5\' 3"  (1.6 m)   Wt 76.3 kg   SpO2 100%   BMI 29.80 kg/m?  ?Physical Exam ?Vitals and nursing note reviewed.  ?Constitutional:   ?   General: She is not in acute distress. ?   Appearance: She is well-developed.  ?HENT:  ?  Head: Normocephalic and atraumatic.  ?   Mouth/Throat:  ?   Pharynx: No oropharyngeal exudate.  ?Eyes:  ?   Conjunctiva/sclera: Conjunctivae normal.  ?   Pupils: Pupils are equal, round, and reactive to light.  ?Neck:  ?   Comments: No meningismus. ?Cardiovascular:  ?   Rate and Rhythm: Normal rate and regular rhythm.  ?   Heart sounds: Normal heart sounds. No murmur heard. ?Pulmonary:  ?   Effort: Pulmonary effort is normal. No respiratory distress.  ?   Breath sounds: Normal breath sounds.  ?Abdominal:  ?   Palpations: Abdomen is soft.  ?   Tenderness: There is no abdominal tenderness. There is no guarding or rebound.  ?   Comments: LLQ tenderness. Voluntary guarding  ?Musculoskeletal:     ?   General: No  tenderness. Normal range of motion.  ?   Cervical back: Normal range of motion and neck supple.  ?   Comments: Paraspinal lumbar tenderness bilaterally  ?Skin: ?   General: Skin is warm.  ?Neurological:  ?   Mental Status: She is alert and oriented to person, place, and time.  ?   Cranial Nerves: No cranial nerve deficit.  ?   Motor: No abnormal muscle tone.  ?   Coordination: Coordination normal.  ?   Comments:  5/5 strength throughout. CN 2-12 intact.Equal grip strength.   ?Psychiatric:     ?   Behavior: Behavior normal.  ? ? ?ED Results / Procedures / Treatments   ?Labs ?(all labs ordered are listed, but only abnormal results are displayed) ?Labs Reviewed  ?COMPREHENSIVE METABOLIC PANEL - Abnormal; Notable for the following components:  ?    Result Value  ? Potassium 3.3 (*)   ? Glucose, Bld 146 (*)   ? All other components within normal limits  ?URINALYSIS, ROUTINE W REFLEX MICROSCOPIC - Abnormal; Notable for the following components:  ? APPearance HAZY (*)   ? Leukocytes,Ua TRACE (*)   ? All other components within normal limits  ?LIPASE, BLOOD  ?CBC  ?PREGNANCY, URINE  ?I-STAT BETA HCG BLOOD, ED ([facility], [facility], [facility] ONLY)  ? ? ?EKG ?None ? ?Radiology ?CT ABDOMEN PELVIS W CONTRAST ? ?Result Date: 01/19/2022 ?CLINICAL DATA:  Diverticulitis. EXAM: CT ABDOMEN AND PELVIS WITH CONTRAST TECHNIQUE: Multidetector CT imaging of the abdomen and pelvis was performed using the standard protocol following bolus administration of intravenous contrast. RADIATION DOSE REDUCTION: This exam was performed according to the departmental dose-optimization program which includes automated exposure control, adjustment of the mA and/or kV according to patient size and/or use of iterative reconstruction technique. CONTRAST:  150mL OMNIPAQUE IOHEXOL 300 MG/ML  SOLN COMPARISON:  CT abdomen and pelvis dated Jan 16, 2021 FINDINGS: Lower chest: No acute abnormality. Hepatobiliary: Hepatic steatosis. Stable arterially enhancing liver lesions, better  evaluated on recent prior multiphase CT. Reference lesion of the left hepatic lobe measuring 1.8 cm on series 3, image 24. Gallbladder is unremarkable. No biliary ductal dilation. Pancreas: Unremarkable. No pancreatic ductal dilatation or surrounding inflammatory changes. Spleen: Normal in size without focal abnormality. Adrenals/Urinary Tract: Bilateral adrenal glands are unremarkable. Kidneys enhance symmetrically with no evidence of hydronephrosis or nephrolithiasis. Bladder is unremarkable. Stomach/Bowel: Stomach is within normal limits. Appendix appears normal. Mild diverticulosis. No evidence of bowel wall thickening, distention, or inflammatory changes. Vascular/Lymphatic: No significant vascular findings are present. No enlarged abdominal or pelvic lymph nodes. Reproductive: Uterus and bilateral adnexa are unremarkable. Other: No abdominal wall hernia or abnormality. No abdominopelvic ascites. Musculoskeletal: No  acute or significant osseous findings. IMPRESSION: 1. No acute findings in the abdomen or pelvis, including no evidence of diverticulitis. 2. Arterially enhancing liver lesions, better evaluated on recent prior multiphase CT. 3. Hepatic steatosis. Electronically Signed   By: Yetta Glassman M.D.   On: 01/19/2022 12:45   ? ?Procedures ?Procedures  ? ? ?Medications Ordered in ED ?Medications  ?lactated ringers bolus 1,000 mL (has no administration in time range)  ?morphine (PF) 4 MG/ML injection 4 mg (has no administration in time range)  ?ondansetron (ZOFRAN) injection 4 mg (has no administration in time range)  ? ? ?ED Course/ Medical Decision Making/ A&P ?  ?                        ?Medical Decision Making ?Amount and/or Complexity of Data Reviewed ?Labs: ordered. ?Radiology: ordered. ? ?Risk ?Prescription drug management. ? ?Left-sided lower abdominal pain without associated symptoms.  No nausea or vomiting.  No urinary symptoms.  Vital signs are stable. ? ?Urinalysis negative for blood.   Consider kidney stone, diverticulitis, ovarian cyst ?HCG negative.  ?Labs reassuring. ? ?CT scan is obtained to evaluate for kidney stone versus diverticulitis versus other pathology.  Results reviewed and interpreted b

## 2022-01-19 NOTE — ED Triage Notes (Signed)
Pt c/o 8/10 contraction pain in the LLQ of abdomenx3 days. Pt denies N/V/D. Pt denies urinary issues. ?

## 2022-01-19 NOTE — ED Provider Notes (Signed)
4:30 PM-checkout from Dr. Manus Gunning to evaluate transvaginal ultrasound and arrange disposition.  She present earlier today for pain in the left lower quadrant for several days.  She has had comprehensive evaluation.  Urine pregnancy negative, urinalysis essentially normal, CBC normal, metabolic panel normal except potassium low, glucose high, CT abdomen pelvis did not reveal etiology of abdominal pain and was essentially normal, transvaginal ultrasound ordered to evaluate left lower quadrant pain-patient with small bilateral cysts. ? ?5:50 PM- ? ?MDM-patient presenting with abdominal pain, etiology not elucidated with comprehensive testing.  She was seen earlier by Dr. Algie Coffer who suspected that she may have passed a kidney stone.  There is certainly no ongoing signs or symptoms associated with ongoing renal or collecting system disorders.  No evidence for severe systemic bacterial infection, metabolic disorders or hemodynamic instabilities.  Patient is instructed to treat symptoms with Tylenol or Motrin, use a heating pad on the sore area and follow-up with her PCP for problems.  She is given a work release until 01/22/2022. ?  ?Mancel Bale, MD ?01/19/22 1756 ? ?

## 2022-01-19 NOTE — ED Notes (Signed)
Reviewed discharge instructions with patient. Follow-up care and pain management reviewed. Patient verbalized understanding. Patient A&Ox4, VSS, and ambulatory with steady gait upon discharge. 

## 2022-01-23 LAB — COLOGUARD

## 2022-01-31 ENCOUNTER — Telehealth (INDEPENDENT_AMBULATORY_CARE_PROVIDER_SITE_OTHER): Payer: 59 | Admitting: Family Medicine

## 2022-01-31 ENCOUNTER — Telehealth: Payer: Self-pay | Admitting: Family Medicine

## 2022-01-31 ENCOUNTER — Encounter: Payer: Self-pay | Admitting: Family Medicine

## 2022-01-31 DIAGNOSIS — J069 Acute upper respiratory infection, unspecified: Secondary | ICD-10-CM | POA: Diagnosis not present

## 2022-01-31 DIAGNOSIS — L819 Disorder of pigmentation, unspecified: Secondary | ICD-10-CM

## 2022-01-31 MED ORDER — BENZONATATE 100 MG PO CAPS: 100.0000 mg | ORAL_CAPSULE | Freq: Two times a day (BID) | ORAL | 0 refills | Status: DC | PRN

## 2022-01-31 MED ORDER — FEXOFENADINE HCL 180 MG PO TABS: 180.0000 mg | ORAL_TABLET | Freq: Every day | ORAL | 3 refills | Status: DC

## 2022-01-31 NOTE — Telephone Encounter (Signed)
Error/njr °
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
## 2022-02-07 ENCOUNTER — Telehealth: Payer: Self-pay | Admitting: Family Medicine

## 2022-02-07 NOTE — Telephone Encounter (Signed)
Disregard

## 2022-02-07 NOTE — Telephone Encounter (Signed)
Pt's Lab results were mailed to address on file however it was return stating "not delivered as addressed" called to retrieve pt address to update however vmail was full and was unable to leave a msg.   FYI.

## 2022-02-07 NOTE — Telephone Encounter (Signed)
Lab results placed in cabinet up front.

## 2022-02-14 ENCOUNTER — Telehealth: Payer: Self-pay | Admitting: Orthopaedic Surgery

## 2022-02-14 ENCOUNTER — Telehealth: Payer: Self-pay

## 2022-02-14 DIAGNOSIS — Z1231 Encounter for screening mammogram for malignant neoplasm of breast: Secondary | ICD-10-CM

## 2022-02-14 NOTE — Telephone Encounter (Signed)
Last CPE 01/11/22 pt was to schedule her mammogram. States she has not scheduled mammogram screening at this time. Offered mobile mammogram & pt accepts this; wants 1st appt of the day.  Pt also has questions about an MRI that was ordered. Upon review of chart, no MRI ordered & no notes in visits speak of an MRI. Will clarify with pt.

## 2022-02-14 NOTE — Telephone Encounter (Signed)
Patient called needing Rx refilled (Tramadol)  Patient asked if there is a higher dosage she can get?  Patient said she uses -    [facility] [address]. The number to contact patient is [phone]

## 2022-02-15 ENCOUNTER — Other Ambulatory Visit: Payer: Self-pay

## 2022-02-15 ENCOUNTER — Other Ambulatory Visit: Payer: Self-pay | Admitting: Physician Assistant

## 2022-02-15 MED ORDER — TRAMADOL HCL 50 MG PO TABS
50.0000 mg | ORAL_TABLET | Freq: Two times a day (BID) | ORAL | 2 refills | Status: DC | PRN
Start: 2022-02-15 — End: 2022-02-15

## 2022-02-15 MED ORDER — TRAMADOL HCL 50 MG PO TABS: 50.0000 mg | ORAL_TABLET | Freq: Two times a day (BID) | ORAL | 2 refills | Status: DC | PRN

## 2022-02-15 NOTE — Telephone Encounter (Signed)
For some reason it will not let me erx this due to demographics.  Can you send in tramadol 50-100 mg bid prn pain #30 2 refills

## 2022-02-16 ENCOUNTER — Encounter: Payer: Self-pay | Admitting: Emergency Medicine

## 2022-02-16 ENCOUNTER — Ambulatory Visit
Admission: EM | Admit: 2022-02-16 | Discharge: 2022-02-16 | Disposition: A | Discharge status: Home / Self Care | Payer: 59 | Attending: Emergency Medicine | Admitting: Emergency Medicine

## 2022-02-16 DIAGNOSIS — W19XXXA Unspecified fall, initial encounter: Secondary | ICD-10-CM | POA: Diagnosis not present

## 2022-02-16 DIAGNOSIS — M25552 Pain in left hip: Secondary | ICD-10-CM | POA: Diagnosis not present

## 2022-02-16 DIAGNOSIS — M545 Low back pain, unspecified: Secondary | ICD-10-CM

## 2022-02-16 MED ORDER — KETOROLAC TROMETHAMINE 30 MG/ML IJ SOLN
30.0000 mg | Freq: Once | INTRAMUSCULAR | Status: AC
  Administered 2022-02-16: 30 mg via INTRAMUSCULAR

## 2022-02-16 NOTE — ED Triage Notes (Signed)
Pt here post slip and fall at work onto her tailbone and left hip. Describes 10/10 pain with numbness and tingling since this morning. Pt reports she does not know if she hit her head. Pt yelling while the BP cuff inflated that is was too tight. Recorded the BP and requested it not be retaken.

## 2022-02-16 NOTE — Discharge Instructions (Signed)
During your visit today you received an injection of ketorolac, a potent nonsteroid anti-inflammatory pain medication that should give you significant pain relief with the next 6 to 8 hours.  See that you have a prescription for tramadol from your orthopedic provider that you can certainly take to relieve any pain that returns once the ketorolac is worn off.  Please go to [facility] to have an x-ray done of your left hip and pelvis to rule out acute fracture of your hip, pelvis and lower spine.  You do not need to be admitted as a patient at that location but because it is now after 5:00 PM, you will need to go to the ED entrance to have this x-ray done.  Once I receive the results of your x-ray, which typically takes an hour after the imaging is completed, I will put your results to your MyChart account and advise you of any further recommendations.  Thank you for visiting urgent care today.
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
## 2022-02-19 ENCOUNTER — Emergency Department (HOSPITAL_COMMUNITY)
Admission: EM | Admit: 2022-02-19 | Discharge: 2022-02-19 | Disposition: A | Discharge status: Home / Self Care | Payer: 59 | Attending: Emergency Medicine | Admitting: Emergency Medicine

## 2022-02-19 ENCOUNTER — Other Ambulatory Visit: Payer: Self-pay

## 2022-02-19 ENCOUNTER — Emergency Department (HOSPITAL_COMMUNITY): Payer: 59

## 2022-02-19 DIAGNOSIS — W19XXXA Unspecified fall, initial encounter: Secondary | ICD-10-CM | POA: Insufficient documentation

## 2022-02-19 DIAGNOSIS — M25552 Pain in left hip: Secondary | ICD-10-CM | POA: Insufficient documentation

## 2022-02-19 DIAGNOSIS — Z79899 Other long term (current) drug therapy: Secondary | ICD-10-CM | POA: Diagnosis not present

## 2022-02-19 DIAGNOSIS — I10 Essential (primary) hypertension: Secondary | ICD-10-CM | POA: Insufficient documentation

## 2022-02-19 DIAGNOSIS — Y990 Civilian activity done for income or pay: Secondary | ICD-10-CM | POA: Diagnosis not present

## 2022-02-19 DIAGNOSIS — M545 Low back pain, unspecified: Secondary | ICD-10-CM | POA: Insufficient documentation

## 2022-02-19 MED ORDER — METHOCARBAMOL 500 MG PO TABS: 500.0000 mg | ORAL_TABLET | Freq: Two times a day (BID) | ORAL | 0 refills | Status: DC

## 2022-02-19 MED ORDER — KETOROLAC TROMETHAMINE 15 MG/ML IJ SOLN
15.0000 mg | Freq: Once | INTRAMUSCULAR | Status: AC
  Administered 2022-02-19: 15 mg via INTRAMUSCULAR
  Filled 2022-02-19: qty 1

## 2022-02-19 MED ORDER — HYDROCODONE-ACETAMINOPHEN 5-325 MG PO TABS
1.0000 | ORAL_TABLET | Freq: Once | ORAL | Status: AC
  Administered 2022-02-19: 1 via ORAL
  Filled 2022-02-19: qty 1

## 2022-02-19 MED ORDER — METHOCARBAMOL 500 MG PO TABS
500.0000 mg | ORAL_TABLET | Freq: Every evening | ORAL | 0 refills | Status: AC
Start: 2022-02-19 — End: 2022-03-01

## 2022-02-19 NOTE — ED Triage Notes (Signed)
Pt had mechanical fall at work on Friday. Went to UC for same and was given IM shot which helped temporarily but has been in the bed for the last two days d/t pain. Has L hip pain and L lower back pain. Did not hit head, did not pass out.

## 2022-02-19 NOTE — ED Provider Triage Note (Signed)
Emergency Medicine Provider Triage Evaluation Note  Kelly Patton , a 51 y.o. female  was evaluated in triage.  Pt complains of left hip pain after a fall on Friday.  Seen at [facility], unable to get imaging due to facility limitations.  Was given anti-inflammatories, which helped for very short amount time.  Complaining of significant pain, unable to bear weight on legs or put pressure on left hip.  Denies numbness or tingling of the left leg.  No recent fevers  Review of Systems  Positive:  Negative: As above  Physical Exam  BP (!) 131/94 (BP Location: Right Arm)   Pulse (!) 112   Temp 98.8 F (37.1 C) (Oral)   Resp 15   SpO2 96%  Gen:   Awake, no distress  Resp:  Normal effort  MSK:   Moves extremities without difficulty with exception of left hip, with significant TTP Other:  Lower extremities appear neurovascular intact.  Abdomen soft nontender  Medical Decision Making  Medically screening exam initiated at 1:02 PM.  Appropriate orders placed.  Kelly Patton was informed that the remainder of the evaluation will be completed by another provider, this initial triage assessment does not replace that evaluation, and the importance of remaining in the ED until their evaluation is complete.  No true allergy to acetaminophen.  I personally reviewed recent LFTs which were within normal limits.  1 tablet of Norco provided.   Cecil Cobbs, PA-C 02/19/22 1304

## 2022-02-19 NOTE — Discharge Instructions (Addendum)
It was a pleasure taking care of you today!  Your x-ray was negative for any fractures or dislocations today.  You will be sent a prescription for Robaxin, take as directed.  Do not drive or operate heavy machinery while taking this medication as it can make you sleepy or drowsy.  It is important that you call your primary care provider to set up a follow-up appointment regarding today's ED visit.  Call your orthopedist doctor, Dr. Roda Shutters tomorrow to set up a follow-up appointment regarding today's ED visit.  You may continue taking your prescription tramadol as prescribed by your orthopedist.  You may place a warm compress to the affected area for up to 15 minutes at a time, and sure to place a barrier between your skin and the warm compress.  Return to the emergency department for experiencing increasing/worsening difficulty walking, fever, worsening symptoms.

## 2022-02-19 NOTE — ED Provider Notes (Signed)
[facility] EMERGENCY DEPARTMENT Provider Note   CSN: [id number] Arrival date & time: 02/19/22  1145     History  Chief Complaint  Patient presents with   Kelly Patton Kelly Patton is a 51 y.o. female who presents to the Emergency Department complaining of a fall onset 3 days ago.  Patient notes she had a mechanical fall while at work.  She was evaluated in urgent care following the fall and given Toradol which provided relief of her symptoms.  Has associated low back pain.  At home patient has not tried any medications for her symptoms.  She notes that she has been in bed all weekend.  Denies numbness, tingling, gait problem, fever.    The history is provided by the patient. No language interpreter was used.       Home Medications Prior to Admission medications   Medication Sig Start Date End Date Taking? Authorizing Provider  albuterol (VENTOLIN HFA) 108 (90 Base) MCG/ACT inhaler Inhale 2 puffs into the lungs every 6 (six) hours as needed for wheezing or shortness of breath. 08/11/21   [name], MD  benzonatate (TESSALON) 100 MG capsule Take 1 capsule (100 mg total) by mouth 2 (two) times daily as needed for cough. 01/31/22   [name], MD  chlorthalidone (HYGROTON) 25 MG tablet Take 1 tablet (25 mg total) by mouth daily. 01/11/22   [name], MD  cyclobenzaprine (FLEXERIL) 5 MG tablet Take 1 tablet (5 mg total) by mouth at bedtime as needed for muscle spasms. 01/15/22   [name], MD  fexofenadine (ALLEGRA) 180 MG tablet Take 1 tablet (180 mg total) by mouth daily. 01/31/22   [name], MD  gabapentin (NEURONTIN) 300 MG capsule Take 1 capsule (300 mg total) by mouth 3 (three) times daily as needed. 10/05/21   [name], MD  traMADol (ULTRAM) 50 MG tablet Take 1-2 tablets (50-100 mg total) by mouth 2 (two) times daily as needed. 02/15/22   Cristie Hem, PA-C      Allergies    Acetaminophen, Aspirin, and Nsaids     Review of Systems   Review of Systems  Constitutional:  Negative for fever.  Musculoskeletal:  Positive for arthralgias and back pain. Negative for gait problem and joint swelling.  Skin:  Negative for color change and wound.  All other systems reviewed and are negative.   Physical Exam Updated Vital Signs BP (!) 131/94 (BP Location: Right Arm)   Pulse (!) 112   Temp 98.8 F (37.1 C) (Oral)   Resp 15   SpO2 96%  Physical Exam Vitals and nursing note reviewed.  Constitutional:      General: She is not in acute distress.    Appearance: Normal appearance. She is not diaphoretic.  HENT:     Head: Normocephalic and atraumatic.     Mouth/Throat:     Pharynx: No oropharyngeal exudate.  Eyes:     General: No scleral icterus.    Extraocular Movements: Extraocular movements intact.     Conjunctiva/sclera: Conjunctivae normal.  Cardiovascular:     Rate and Rhythm: Normal rate and regular rhythm.     Pulses: Normal pulses.     Heart sounds: Normal heart sounds.  Pulmonary:     Effort: Pulmonary effort is normal. No respiratory distress.     Breath sounds: Normal breath sounds. No wheezing.  Abdominal:     General: Bowel sounds are normal.  Palpations: Abdomen is soft.  Musculoskeletal:        General: Normal range of motion.     Cervical back: Normal range of motion and neck supple.     Comments: No tenderness to palpation noted to left anterior hip.  No overlying skin changes.  Tenderness to palpation noted to lumbar paraspinal muscles on the left.  No overlying skin changes.  Able to ambulate without difficulty.  Tenderness to palpation to the left hip noted with flexion and extension.  Skin:    General: Skin is warm and dry.     Findings: No bruising, erythema or rash.  Neurological:     Mental Status: She is alert.  Psychiatric:        Behavior: Behavior normal.     ED Results / Procedures / Treatments   Labs (all labs ordered are listed, but only abnormal  results are displayed) Labs Reviewed - No data to display  EKG None  Radiology DG Hip Unilat With Pelvis 2-3 Views Left  Result Date: 02/19/2022 CLINICAL DATA:  Fall 3 days ago.  Left hip pain. EXAM: DG HIP (WITH OR WITHOUT PELVIS) 2-3V LEFT COMPARISON:  None Available. FINDINGS: There is no evidence of hip fracture or dislocation. There is no evidence of arthropathy or other focal bone abnormality. IMPRESSION: Negative. Electronically Signed   By: Danae OrleansJohn A Stahl M.D.   On: 02/19/2022 13:22    Procedures Procedures    Medications Ordered in ED Medications  HYDROcodone-acetaminophen (NORCO/VICODIN) 5-325 MG per tablet 1 tablet (1 tablet Oral Given 02/19/22 1305)  ketorolac (TORADOL) 15 MG/ML injection 15 mg (15 mg Intramuscular Given 02/19/22 1840)    ED Course/ Medical Decision Making/ A&P Clinical Course as of 02/19/22 1800  Mon Feb 19, 2022  1758 Offered patient x-ray of her lower back, patient declined at this time and noted that she will follow-up with her appointment with a chiropractor tomorrow.  Discussed discharge treatment plan with patient at bedside.  Patient agreeable at this time.  Patient appears safe for discharge. [SB]    Clinical Course User Index [SB] Shatana Saxton A, PA-C                           Medical Decision Making  Patient with left hip pain onset 02/16/2022 after mechanical fall.  Vital signs stable, patient afebrile.  On exam patient with No tenderness to palpation noted to left anterior hip.  No overlying skin changes.  Tenderness to palpation noted to lumbar paraspinal muscles on the left.  No overlying skin changes.  Able to ambulate without difficulty.  Tenderness to palpation to the left hip noted with flexion and extension.  No acute cardiovascular or respiratory exam findings. Differential diagnosis includes fracture, dislocation, strain.    Additional history obtained:  External records from outside source obtained and reviewed including: Pt was  evaluated by urgent care on 02/16/22 who gave her toradol at the time for her symptoms.   Imaging: I ordered imaging studies including left hip xray I independently visualized and interpreted imaging which showed: negative for acute fracture I agree with the radiologist interpretation  Medications:  I ordered medication including norco for symptom management Reevaluation of the patient after these medicines and interventions, I reevaluated the patient and found that they have improved I have reviewed the patients home medicines and have made adjustments as needed   Disposition: Presentation suspicious for musculoskeletal concern of left hip pain and lower back  pain.  Doubt acute fracture, dislocation, herniation at this time. After consideration of the diagnostic results and the patients response to treatment, I feel that the patient would benefit from Discharge home.  Patient will be sent home with a prescription for Robaxin, discussed with patient importance of not driving or operating heavy machinery while taking this medication as it can cause her to be drowsy or sleepy.  Patient acknowledges at this time.  Patient has a orthopedist, Dr. Roda Shutters, discussed with patient importance of maintaining follow-up with her orthopedist.  Also discussed with patient importance of taking her prescription tramadol as prescribed.  Also discussed importance of follow-up with her primary care provider. Supportive care measures and strict return precautions discussed with patient at bedside. Pt acknowledges and verbalizes understanding. Pt appears safe for discharge. Follow up as indicated in discharge paperwork.    This chart was dictated using voice recognition software, Dragon. Despite the best efforts of this provider to proofread and correct errors, errors may still occur which can change documentation meaning.  Final Clinical Impression(s) / ED Diagnoses Final diagnoses:  Left hip pain  Fall, initial  encounter    Rx / DC Orders ED Discharge Orders     None         Rochel Privett A, PA-C 02/19/22 1941    Wynetta Fines, MD 02/20/22 [phone]

## 2022-02-22 NOTE — Telephone Encounter (Signed)
LVM for pt to return call.   Mammogram scheduled for mammogram bus on Aug 11 at 8a here at office. Needed clarification on what imaging the patient is referring to.

## 2022-02-27 ENCOUNTER — Ambulatory Visit: Payer: 59 | Admitting: Orthopaedic Surgery

## 2022-03-01 ENCOUNTER — Ambulatory Visit
Admission: RE | Admit: 2022-03-01 | Discharge: 2022-03-01 | Disposition: A | Discharge status: Home / Self Care | Payer: 59 | Source: Ambulatory Visit | Attending: Family Medicine | Admitting: Family Medicine

## 2022-03-01 DIAGNOSIS — Z1231 Encounter for screening mammogram for malignant neoplasm of breast: Secondary | ICD-10-CM

## 2022-03-05 ENCOUNTER — Emergency Department (HOSPITAL_COMMUNITY)
Admission: EM | Admit: 2022-03-05 | Discharge: 2022-03-05 | Disposition: A | Discharge status: Home / Self Care | Payer: 59 | Attending: Emergency Medicine | Admitting: Emergency Medicine

## 2022-03-05 ENCOUNTER — Emergency Department (HOSPITAL_COMMUNITY): Payer: 59

## 2022-03-05 ENCOUNTER — Other Ambulatory Visit: Payer: Self-pay

## 2022-03-05 ENCOUNTER — Other Ambulatory Visit: Payer: Self-pay | Admitting: Family Medicine

## 2022-03-05 ENCOUNTER — Encounter (HOSPITAL_COMMUNITY): Payer: Self-pay

## 2022-03-05 DIAGNOSIS — I10 Essential (primary) hypertension: Secondary | ICD-10-CM | POA: Insufficient documentation

## 2022-03-05 DIAGNOSIS — G44319 Acute post-traumatic headache, not intractable: Secondary | ICD-10-CM | POA: Diagnosis not present

## 2022-03-05 DIAGNOSIS — W19XXXD Unspecified fall, subsequent encounter: Secondary | ICD-10-CM | POA: Insufficient documentation

## 2022-03-05 DIAGNOSIS — R928 Other abnormal and inconclusive findings on diagnostic imaging of breast: Secondary | ICD-10-CM

## 2022-03-05 DIAGNOSIS — M545 Low back pain, unspecified: Secondary | ICD-10-CM | POA: Diagnosis not present

## 2022-03-05 DIAGNOSIS — R519 Headache, unspecified: Secondary | ICD-10-CM | POA: Diagnosis not present

## 2022-03-05 DIAGNOSIS — M4807 Spinal stenosis, lumbosacral region: Secondary | ICD-10-CM | POA: Diagnosis not present

## 2022-03-05 MED ORDER — MELOXICAM 7.5 MG PO TABS: 7.5000 mg | ORAL_TABLET | Freq: Every day | ORAL | 0 refills | Status: DC

## 2022-03-05 MED ORDER — LIDOCAINE 5 % EX PTCH: 1.0000 | MEDICATED_PATCH | CUTANEOUS | 0 refills | Status: DC

## 2022-03-05 NOTE — ED Triage Notes (Signed)
Patient reports that she tripped on a glass rack at work on 02/16/22. Patient reports that she continues to have left lower and mid lower back pain. Patient also c/o headache and left leg pain.

## 2022-03-08 ENCOUNTER — Ambulatory Visit (INDEPENDENT_AMBULATORY_CARE_PROVIDER_SITE_OTHER): Payer: 59 | Admitting: Orthopaedic Surgery

## 2022-03-08 DIAGNOSIS — M65312 Trigger thumb, left thumb: Secondary | ICD-10-CM

## 2022-03-08 NOTE — Progress Notes (Signed)
Office Visit Note   Patient: Kelly Patton           Date of Birth: [birth year]           MRN: [id number] Visit Date: 03/08/2022              Requested by: Kelly Saint, MD [address] [place],  [place] [address] PCP: Kelly Saint, MD   Assessment & Plan: Visit Diagnoses:  1. Trigger finger of left thumb     Plan: Impression is recurrent left trigger thumb status post 2 cortisone injections with temporary relief.  Based on her options she elects to move forward with a left trigger thumb release.  Risk benefits prognosis reviewed with the patient.  We will provide her with AlumaFoam splint today.  Kelly Patton will call the patient to schedule surgery.  Follow-Up Instructions: No follow-ups on file.   Orders:  No orders of the defined types were placed in this encounter.  No orders of the defined types were placed in this encounter.     Procedures: No procedures performed   Clinical Data: No additional findings.   Subjective: Chief Complaint  Patient presents with   Left Thumb - Pain    HPI Kelly Patton returns today for recurrent left trigger thumb.  She has had 2 injections in the last 5 months with temporary relief.  Ready for surgery at this point and is not interested in 1/3 injection.  Review of Systems   Objective: Vital Signs: There were no vitals taken for this visit.  Physical Exam  Ortho Exam Examination of left thumb is unchanged. Specialty Comments:  No specialty comments available.  Imaging: No results found.   PMFS History: Patient Active Problem List   Diagnosis Date Noted   Trigger finger of left thumb 11/21/2021   PTSD (post-traumatic stress disorder) 12/28/2020   Substance abuse (HCC) 12/16/2020   Alcohol dependence with unspecified alcohol-induced disorder (HCC) 12/16/2020   Left carpal tunnel syndrome 08/10/2020   Primary osteoarthritis of first carpometacarpal joint of left hand 08/10/2020   History of 2019  novel coronavirus disease (COVID-19) 07/19/2020   Arthritis 07/19/2020   Lumbar back pain 11/11/2019   Trigger thumb, right thumb 12/09/2017   Essential hypertension 07/17/2017   Primary insomnia 07/17/2017   Thumb pain, right 04/08/2017   Past Medical History:  Diagnosis Date   Allergies    Anxiety    Arthritis    Carpal tunnel syndrome, right    Depression    GERD (gastroesophageal reflux disease)    Hypertension    Shortness of breath dyspnea    with exertion uses inhaler    Family History  Problem Relation Age of Onset   Hypertension Father    Hypertension Paternal Grandmother    Hypertension Paternal Grandfather    Alcoholism Paternal Grandfather    Hypertension Brother    Breast cancer Neg Hx     Past Surgical History:  Procedure Laterality Date   CARPAL TUNNEL RELEASE Right 01/31/2016   Procedure: CARPAL TUNNEL RELEASE;  Surgeon: Cammy Copa, MD;  Location: [facility] OR;  Service: Orthopedics;  Laterality: Right;   FINGER SURGERY Right    ring finger, due to knife cut   HAND TENDON SURGERY Right    TRIGGER FINGER RELEASE Right 11/26/2017   Procedure: RELEASE TRIGGER FINGER RIGHT THUMB;  Surgeon: Cammy Copa, MD;  Location: [facility] OR;  Service: Orthopedics;  Laterality: Right;   Social History   Occupational History  Occupation: Location manager   Occupation: Presenter, broadcasting: [employer]  Tobacco Use   Smoking status: Former    Types: Cigarettes   Smokeless tobacco: Never   Tobacco comments:    Smokes 1 cigarette/week for 2 years  Vaping Use   Vaping Use: Never used  Substance and Sexual Activity   Alcohol use: Yes    Alcohol/week: 40.0 standard drinks of alcohol    Types: 40 Shots of liquor per week   Drug use: Yes    Frequency: 1.0 times per week    Types: Marijuana   Sexual activity: Yes    Birth control/protection: None

## 2022-03-28 ENCOUNTER — Encounter (HOSPITAL_BASED_OUTPATIENT_CLINIC_OR_DEPARTMENT_OTHER): Payer: Self-pay | Admitting: Orthopaedic Surgery

## 2022-03-28 ENCOUNTER — Other Ambulatory Visit: Payer: Self-pay

## 2022-03-30 ENCOUNTER — Encounter (HOSPITAL_BASED_OUTPATIENT_CLINIC_OR_DEPARTMENT_OTHER)
Admission: RE | Admit: 2022-03-30 | Discharge: 2022-03-30 | Disposition: A | Discharge status: Home / Self Care | Payer: 59 | Source: Ambulatory Visit | Attending: Orthopaedic Surgery | Admitting: Orthopaedic Surgery

## 2022-03-30 DIAGNOSIS — Z01812 Encounter for preprocedural laboratory examination: Secondary | ICD-10-CM | POA: Diagnosis not present

## 2022-03-30 LAB — BASIC METABOLIC PANEL
Anion gap: 7 (ref 5–15)
BUN: 16 mg/dL (ref 6–20)
CO2: 29 mmol/L (ref 22–32)
Calcium: 10.3 mg/dL (ref 8.9–10.3)
Chloride: 102 mmol/L (ref 98–111)
Creatinine, Ser: 0.7 mg/dL (ref 0.44–1.00)
GFR, Estimated: >60 mL/min (ref >60)
Glucose, Bld: 116 mg/dL — ABNORMAL HIGH (ref 70–99)
Potassium: 3.7 mmol/L (ref 3.5–5.1)
Sodium: 138 mmol/L (ref 135–145)

## 2022-03-30 NOTE — Progress Notes (Signed)

## 2022-04-02 ENCOUNTER — Telehealth: Payer: Self-pay | Admitting: Orthopaedic Surgery

## 2022-04-02 NOTE — Telephone Encounter (Signed)
Patient called asked if she can get a return to work note stating she can return to work on Saturday but on light duty. Patient asked if the letter can also state she is not to use her left hand at all.  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 The number to contact patient is [phone]

## 2022-04-02 NOTE — Telephone Encounter (Signed)
yes

## 2022-04-04 ENCOUNTER — Other Ambulatory Visit: Payer: Self-pay

## 2022-04-04 ENCOUNTER — Ambulatory Visit (HOSPITAL_BASED_OUTPATIENT_CLINIC_OR_DEPARTMENT_OTHER): Payer: 59 | Admitting: Anesthesiology

## 2022-04-04 ENCOUNTER — Encounter (HOSPITAL_BASED_OUTPATIENT_CLINIC_OR_DEPARTMENT_OTHER): Payer: Self-pay | Admitting: Orthopaedic Surgery

## 2022-04-04 ENCOUNTER — Ambulatory Visit (HOSPITAL_BASED_OUTPATIENT_CLINIC_OR_DEPARTMENT_OTHER)
Admission: RE | Admit: 2022-04-04 | Discharge: 2022-04-04 | Disposition: A | Discharge status: Home / Self Care | Payer: 59 | Attending: Orthopaedic Surgery | Admitting: Orthopaedic Surgery

## 2022-04-04 ENCOUNTER — Encounter (HOSPITAL_BASED_OUTPATIENT_CLINIC_OR_DEPARTMENT_OTHER)
Admission: RE | Disposition: A | Discharge status: Home / Self Care | Payer: Self-pay | Source: Home / Self Care | Attending: Orthopaedic Surgery

## 2022-04-04 DIAGNOSIS — Z87891 Personal history of nicotine dependence: Secondary | ICD-10-CM

## 2022-04-04 DIAGNOSIS — I10 Essential (primary) hypertension: Secondary | ICD-10-CM | POA: Insufficient documentation

## 2022-04-04 DIAGNOSIS — M1990 Unspecified osteoarthritis, unspecified site: Secondary | ICD-10-CM | POA: Diagnosis not present

## 2022-04-04 DIAGNOSIS — F17210 Nicotine dependence, cigarettes, uncomplicated: Secondary | ICD-10-CM | POA: Diagnosis not present

## 2022-04-04 DIAGNOSIS — E669 Obesity, unspecified: Secondary | ICD-10-CM | POA: Insufficient documentation

## 2022-04-04 DIAGNOSIS — F418 Other specified anxiety disorders: Secondary | ICD-10-CM

## 2022-04-04 DIAGNOSIS — M659 Synovitis and tenosynovitis, unspecified: Secondary | ICD-10-CM

## 2022-04-04 DIAGNOSIS — M65842 Other synovitis and tenosynovitis, left hand: Secondary | ICD-10-CM

## 2022-04-04 DIAGNOSIS — R69 Illness, unspecified: Secondary | ICD-10-CM | POA: Diagnosis not present

## 2022-04-04 DIAGNOSIS — M65312 Trigger thumb, left thumb: Secondary | ICD-10-CM | POA: Diagnosis not present

## 2022-04-04 DIAGNOSIS — K219 Gastro-esophageal reflux disease without esophagitis: Secondary | ICD-10-CM | POA: Insufficient documentation

## 2022-04-04 HISTORY — PX: TRIGGER FINGER RELEASE: SHX641

## 2022-04-04 HISTORY — DX: Alcohol abuse, uncomplicated: F10.10

## 2022-04-04 LAB — POCT PREGNANCY, URINE: Preg Test, Ur: NEGATIVE

## 2022-04-04 SURGERY — RELEASE, A1 PULLEY, FOR TRIGGER FINGER
Anesthesia: Monitor Anesthesia Care | Site: Thumb | Laterality: Left

## 2022-04-04 MED ORDER — BUPIVACAINE HCL (PF) 0.25 % IJ SOLN
INTRAMUSCULAR | Status: AC
Start: 2022-04-04 — End: ?
  Filled 2022-04-04: qty 30

## 2022-04-04 MED ORDER — ONDANSETRON HCL 4 MG/2ML IJ SOLN
INTRAMUSCULAR | Status: DC | PRN
  Administered 2022-04-04: 4 mg via INTRAVENOUS

## 2022-04-04 MED ORDER — TRAMADOL HCL 50 MG PO TABS: 50.0000 mg | ORAL_TABLET | Freq: Every day | ORAL | 0 refills | Status: DC | PRN

## 2022-04-04 MED ORDER — BUPIVACAINE HCL (PF) 0.5 % IJ SOLN
INTRAMUSCULAR | Status: AC
  Filled 2022-04-04: qty 30

## 2022-04-04 MED ORDER — MIDAZOLAM HCL 2 MG/2ML IJ SOLN
INTRAMUSCULAR | Status: AC
  Filled 2022-04-04: qty 2

## 2022-04-04 MED ORDER — FENTANYL CITRATE (PF) 100 MCG/2ML IJ SOLN: 25.0000 ug | INTRAMUSCULAR | Status: DC | PRN

## 2022-04-04 MED ORDER — CEFAZOLIN SODIUM-DEXTROSE 2-4 GM/100ML-% IV SOLN
INTRAVENOUS | Status: AC
  Filled 2022-04-04: qty 100

## 2022-04-04 MED ORDER — LACTATED RINGERS IV SOLN: INTRAVENOUS | Status: DC

## 2022-04-04 MED ORDER — FENTANYL CITRATE (PF) 100 MCG/2ML IJ SOLN
INTRAMUSCULAR | Status: AC
  Filled 2022-04-04: qty 2

## 2022-04-04 MED ORDER — ONDANSETRON HCL 4 MG/2ML IJ SOLN
INTRAMUSCULAR | Status: AC
  Filled 2022-04-04: qty 2

## 2022-04-04 MED ORDER — OXYCODONE HCL 5 MG PO TABS: 5.0000 mg | ORAL_TABLET | Freq: Once | ORAL | Status: DC | PRN

## 2022-04-04 MED ORDER — LIDOCAINE HCL (PF) 1 % IJ SOLN
INTRAMUSCULAR | Status: AC
  Filled 2022-04-04: qty 30

## 2022-04-04 MED ORDER — CEFAZOLIN SODIUM-DEXTROSE 2-4 GM/100ML-% IV SOLN
2.0000 g | INTRAVENOUS | Status: AC
Start: 2022-04-04 — End: 2022-04-04
  Administered 2022-04-04: 2 g via INTRAVENOUS

## 2022-04-04 MED ORDER — PROMETHAZINE HCL 25 MG/ML IJ SOLN: 6.2500 mg | INTRAMUSCULAR | Status: DC | PRN

## 2022-04-04 MED ORDER — MIDAZOLAM HCL 2 MG/2ML IJ SOLN
INTRAMUSCULAR | Status: DC | PRN
  Administered 2022-04-04: 2 mg via INTRAVENOUS

## 2022-04-04 MED ORDER — LIDOCAINE HCL 1 % IJ SOLN
INTRAMUSCULAR | Status: DC | PRN
  Administered 2022-04-04: 2.5 mL

## 2022-04-04 MED ORDER — LACTATED RINGERS IV SOLN
INTRAVENOUS | Status: DC
Start: 2022-04-04 — End: 2022-04-04

## 2022-04-04 MED ORDER — FENTANYL CITRATE (PF) 100 MCG/2ML IJ SOLN
INTRAMUSCULAR | Status: DC | PRN
  Administered 2022-04-04: 25 ug via INTRAVENOUS
  Administered 2022-04-04: 50 ug via INTRAVENOUS
  Administered 2022-04-04: 25 ug via INTRAVENOUS

## 2022-04-04 MED ORDER — OXYCODONE HCL 5 MG/5ML PO SOLN: 5.0000 mg | Freq: Once | ORAL | Status: DC | PRN

## 2022-04-04 MED ORDER — BUPIVACAINE-EPINEPHRINE (PF) 0.25% -1:200000 IJ SOLN
INTRAMUSCULAR | Status: AC
Start: 2022-04-04 — End: ?
  Filled 2022-04-04: qty 30

## 2022-04-04 MED ORDER — BUPIVACAINE-EPINEPHRINE 0.25% -1:200000 IJ SOLN
INTRAMUSCULAR | Status: DC | PRN
  Administered 2022-04-04: 2.5 mL

## 2022-04-04 MED ORDER — PROPOFOL 500 MG/50ML IV EMUL
INTRAVENOUS | Status: DC | PRN
  Administered 2022-04-04 (×2): 20 mg via INTRAVENOUS
  Administered 2022-04-04: 100 ug/kg/min via INTRAVENOUS

## 2022-04-04 MED ORDER — PROPOFOL 500 MG/50ML IV EMUL
INTRAVENOUS | Status: AC
  Filled 2022-04-04: qty 50

## 2022-04-04 SURGICAL SUPPLY — 43 items
BAND INSRT 18 STRL LF DISP RB (MISCELLANEOUS) ×2
BAND RUBBER #18 3X1/16 STRL (MISCELLANEOUS) ×4 IMPLANT
BLADE SURG 15 STRL LF DISP TIS (BLADE) ×1 IMPLANT
BLADE SURG 15 STRL SS (BLADE) ×2
BNDG CMPR 9X4 STRL LF SNTH (GAUZE/BANDAGES/DRESSINGS)
BNDG ELASTIC 3X5.8 VLCR STR LF (GAUZE/BANDAGES/DRESSINGS) ×2 IMPLANT
BNDG ESMARK 4X9 LF (GAUZE/BANDAGES/DRESSINGS) IMPLANT
BRUSH SCRUB EZ PLAIN DRY (MISCELLANEOUS) ×2 IMPLANT
CANISTER SUCT 1200ML W/VALVE (MISCELLANEOUS) ×2 IMPLANT
CORD BIPOLAR FORCEPS 12FT (ELECTRODE) ×2 IMPLANT
COVER BACK TABLE 60X90IN (DRAPES) ×2 IMPLANT
COVER MAYO STAND STRL (DRAPES) ×2 IMPLANT
COVER SURGICAL LIGHT HANDLE (MISCELLANEOUS) ×1 IMPLANT
CUFF TOURN SGL QUICK 18X4 (TOURNIQUET CUFF) ×2 IMPLANT
DRAPE EXTREMITY T 121X128X90 (DISPOSABLE) ×2 IMPLANT
DRAPE SURG 17X23 STRL (DRAPES) ×2 IMPLANT
GAUZE SPONGE 4X4 12PLY STRL (GAUZE/BANDAGES/DRESSINGS) ×2 IMPLANT
GAUZE XEROFORM 1X8 LF (GAUZE/BANDAGES/DRESSINGS) ×2 IMPLANT
GLOVE ECLIPSE 7.0 STRL STRAW (GLOVE) ×2 IMPLANT
GLOVE INDICATOR 7.0 STRL GRN (GLOVE) ×2 IMPLANT
GLOVE INDICATOR 7.5 STRL GRN (GLOVE) ×2 IMPLANT
GLOVE SURG SYN 7.5  E (GLOVE) ×2
GLOVE SURG SYN 7.5 E (GLOVE) ×1 IMPLANT
GLOVE SURG SYN 7.5 PF PI (GLOVE) ×1 IMPLANT
GOWN STRL REIN XL XLG (GOWN DISPOSABLE) ×2 IMPLANT
GOWN STRL REUS W/ TWL XL LVL3 (GOWN DISPOSABLE) ×1 IMPLANT
GOWN STRL REUS W/TWL XL LVL3 (GOWN DISPOSABLE) ×2
NDL HYPO 25X1 1.5 SAFETY (NEEDLE) ×1 IMPLANT
NEEDLE HYPO 25X1 1.5 SAFETY (NEEDLE) ×2 IMPLANT
NS IRRIG 1000ML POUR BTL (IV SOLUTION) ×2 IMPLANT
PACK BASIN DAY SURGERY FS (CUSTOM PROCEDURE TRAY) ×2 IMPLANT
PAD CAST 3X4 CTTN HI CHSV (CAST SUPPLIES) ×1 IMPLANT
PADDING CAST COTTON 3X4 STRL (CAST SUPPLIES) ×2
SHEET MEDIUM DRAPE 40X70 STRL (DRAPES) ×2 IMPLANT
SPIKE FLUID TRANSFER (MISCELLANEOUS) IMPLANT
STOCKINETTE 4X48 STRL (DRAPES) ×2 IMPLANT
SUT ETHILON 4 0 PS 2 18 (SUTURE) ×2 IMPLANT
SYR BULB EAR ULCER 3OZ GRN STR (SYRINGE) ×2 IMPLANT
SYR CONTROL 10ML LL (SYRINGE) ×2 IMPLANT
TOWEL GREEN STERILE FF (TOWEL DISPOSABLE) ×2 IMPLANT
TRAY DSU PREP LF (CUSTOM PROCEDURE TRAY) ×2 IMPLANT
TUBE CONNECTING 20X1/4 (TUBING) ×2 IMPLANT
UNDERPAD 30X36 HEAVY ABSORB (UNDERPADS AND DIAPERS) ×2 IMPLANT

## 2022-04-04 NOTE — H&P (Signed)
PREOPERATIVE H&P  Chief Complaint: LEFT TRIGGER THUMB  HPI: Kelly Patton is a 51 y.o. female who presents for surgical treatment of LEFT TRIGGER THUMB.  She denies any changes in medical history.  Past Medical History:  Diagnosis Date   Allergies    Anxiety    Arthritis    Carpal tunnel syndrome, right    Depression    ETOH abuse    daily marijuana   GERD (gastroesophageal reflux disease)    Hypertension    Shortness of breath dyspnea    with exertion uses inhaler   Past Surgical History:  Procedure Laterality Date   CARPAL TUNNEL RELEASE Right 01/31/2016   Procedure: CARPAL TUNNEL RELEASE;  Surgeon: Cammy Copa, MD;  Location: [facility] OR;  Service: Orthopedics;  Laterality: Right;   FINGER SURGERY Right    ring finger, due to knife cut   HAND TENDON SURGERY Right    TRIGGER FINGER RELEASE Right 11/26/2017   Procedure: RELEASE TRIGGER FINGER RIGHT THUMB;  Surgeon: Cammy Copa, MD;  Location: [facility] OR;  Service: Orthopedics;  Laterality: Right;   Social History   Socioeconomic History   Marital status: Single    Spouse name: Not on file   Number of children: 0   Years of education: Not on file   Highest education level: Not on file  Occupational History   Occupation: Location manager   Occupation: cook     Employer: [employer]  Tobacco Use   Smoking status: Former    Types: Cigarettes   Smokeless tobacco: Never  Vaping Use   Vaping Use: Never used  Substance and Sexual Activity   Alcohol use: Yes    Alcohol/week: 40.0 standard drinks of alcohol    Types: 40 Shots of liquor per week    Comment: drinks 2-3 shots liquor/day   Drug use: Yes    Frequency: 1.0 times per week    Types: Marijuana   Sexual activity: Yes    Comment: female partners only  Other Topics Concern   Not on file  Social History Narrative   Not on file   Social Determinants of Health   Financial Resource Strain: Not on file  Food Insecurity: Not on file   Transportation Needs: Not on file  Physical Activity: Not on file  Stress: Not on file  Social Connections: Not on file   Family History  Problem Relation Age of Onset   Hypertension Father    Hypertension Paternal Grandmother    Hypertension Paternal Grandfather    Alcoholism Paternal Grandfather    Hypertension Brother    Breast cancer Neg Hx    Allergies  Allergen Reactions   Acetaminophen Other (See Comments)    Told not to take due to "liver spot"   Aspirin Other (See Comments)    Told not to take due to "liver spot"   Nsaids Other (See Comments)    Told not to take due to "liver spot"   Prior to Admission medications   Medication Sig Start Date End Date Taking? Authorizing Provider  albuterol (VENTOLIN HFA) 108 (90 Base) MCG/ACT inhaler Inhale 2 puffs into the lungs every 6 (six) hours as needed for wheezing or shortness of breath. 08/11/21  Yes [name], MD  chlorthalidone (HYGROTON) 25 MG tablet Take 1 tablet (25 mg total) by mouth daily. 01/11/22  Yes [name], MD  cyclobenzaprine (FLEXERIL) 5 MG tablet Take 1 tablet (5 mg total) by mouth at bedtime as needed  for muscle spasms. 01/15/22  Yes [name], MD  fexofenadine (ALLEGRA) 180 MG tablet Take 1 tablet (180 mg total) by mouth daily. 01/31/22  Yes [name], MD  gabapentin (NEURONTIN) 300 MG capsule Take 1 capsule (300 mg total) by mouth 3 (three) times daily as needed. 10/05/21  Yes [name], MD  lidocaine (LIDODERM) 5 % Place 1 patch onto the skin daily. Remove & Discard patch within 12 hours or as directed by MD 03/05/22   Henderly, Britni A, PA-C     Positive ROS: All other systems have been reviewed and were otherwise negative with the exception of those mentioned in the HPI and as above.  Physical Exam: General: Alert, no acute distress Cardiovascular: No pedal edema Respiratory: No cyanosis, no use of accessory musculature GI: abdomen soft Skin: No lesions in the area of  chief complaint Neurologic: Sensation intact distally Psychiatric: Patient is competent for consent with normal mood and affect Lymphatic: no lymphedema  MUSCULOSKELETAL: exam stable  Assessment: LEFT TRIGGER THUMB  Plan: Plan for Procedure(s): LEFT TRIGGER THUMB RELEASE  The risks benefits and alternatives were discussed with the patient including but not limited to the risks of nonoperative treatment, versus surgical intervention including infection, bleeding, nerve injury,  blood clots, cardiopulmonary complications, morbidity, mortality, among others, and they were willing to proceed.   Glee Arvin, MD 04/04/2022 11:13 AM

## 2022-04-04 NOTE — Discharge Instructions (Addendum)
Postoperative instructions:  Weightbearing instructions: as tolerated  Dressing instructions: Keep your dressing and/or splint clean and dry at all times.  It will be removed at your first post-operative appointment.  Your stitches and/or staples will be removed at this visit.  Incision instructions:  Do not soak your incision for 3 weeks after surgery.  If the incision gets wet, pat dry and do not scrub the incision.  Pain control:  You have been given a prescription to be taken as directed for post-operative pain control.  In addition, elevate the operative extremity above the heart at all times to prevent swelling and throbbing pain.  Take over-the-counter Colace, 100mg  by mouth twice a day while taking narcotic pain medications to help prevent constipation.  Follow up appointments: 1) 7 days for suture removal and wound check. 2) Dr. as scheduled.   -------------------------------------------------------------------------------------------------------------  After Surgery Pain Control:  After your surgery, post-surgical discomfort or pain is likely. This discomfort can last several days to a few weeks. At certain times of the day your discomfort may be more intense.  Did you receive a nerve block?  A nerve block can provide pain relief for one hour to two days after your surgery. As long as the nerve block is working, you will experience little or no sensation in the area the surgeon operated on.  As the nerve block wears off, you will begin to experience pain or discomfort. It is very important that you begin taking your prescribed pain medication before the nerve block fully wears off. Treating your pain at the first sign of the block wearing off will ensure your pain is better controlled and more tolerable when full-sensation returns. Do not wait until the pain is intolerable, as the medicine will be less effective. It is better to treat pain in advance than to try and catch up.   General Anesthesia:  If you did not receive a nerve block during your surgery, you will need to start taking your pain medication shortly after your surgery and should continue to do so as prescribed by your surgeon.  Pain Medication:  Most commonly we prescribe Vicodin and Percocet for post-operative pain. Both of these medications contain a combination of acetaminophen (Tylenol) and a narcotic to help control pain.   It takes between 30 and 45 minutes before pain medication starts to work. It is important to take your medication before your pain level gets too intense.   Nausea is a common side effect of many pain medications. You will want to eat something before taking your pain medicine to help prevent nausea.   If you are taking a prescription pain medication that contains acetaminophen, we recommend that you do not take additional over the counter acetaminophen (Tylenol).  Other pain relieving options:   Using a cold pack to ice the affected area a few times a day (15 to 20 minutes at a time) can help to relieve pain, reduce swelling and bruising.   Elevation of the affected area can also help to reduce pain and swelling. Post Anesthesia Home Care Instructions  Activity: Get plenty of rest for the remainder of the day. A responsible individual must stay with you for 24 hours following the procedure.  For the next 24 hours, DO NOT: -Drive a car -Roda Shutters -Drink alcoholic beverages -Take any medication unless instructed by your physician -Make any legal decisions or sign important papers.  Meals: Start with liquid foods such as gelatin or soup. Progress to  regular foods as tolerated. Avoid greasy, spicy, heavy foods. If nausea and/or vomiting occur, drink only clear liquids until the nausea and/or vomiting subsides. Call your physician if vomiting continues.  Special Instructions/Symptoms: Your throat may feel dry or sore from the anesthesia or the breathing tube  placed in your throat during surgery. If this causes discomfort, gargle with warm salt water. The discomfort should disappear within 24 hours.  If you had a scopolamine patch placed behind your ear for the management of post- operative nausea and/or vomiting:  1. The medication in the patch is effective for 72 hours, after which it should be removed.  Wrap patch in a tissue and discard in the trash. Wash hands thoroughly with soap and water. 2. You may remove the patch earlier than 72 hours if you experience unpleasant side effects which may include dry mouth, dizziness or visual disturbances. 3. Avoid touching the patch. Wash your hands with soap and water after contact with the patch.      Post Anesthesia Home Care Instructions  Activity: Get plenty of rest for the remainder of the day. A responsible individual must stay with you for 24 hours following the procedure.  For the next 24 hours, DO NOT: -Drive a car -Advertising copywriter -Drink alcoholic beverages -Take any medication unless instructed by your physician -Make any legal decisions or sign important papers.  Meals: Start with liquid foods such as gelatin or soup. Progress to regular foods as tolerated. Avoid greasy, spicy, heavy foods. If nausea and/or vomiting occur, drink only clear liquids until the nausea and/or vomiting subsides. Call your physician if vomiting continues.  Special Instructions/Symptoms: Your throat may feel dry or sore from the anesthesia or the breathing tube placed in your throat during surgery. If this causes discomfort, gargle with warm salt water. The discomfort should disappear within 24 hours.  If you had a scopolamine patch placed behind your ear for the management of post- operative nausea and/or vomiting:  1. The medication in the patch is effective for 72 hours, after which it should be removed.  Wrap patch in a tissue and discard in the trash. Wash hands thoroughly with soap and water. 2. You may  remove the patch earlier than 72 hours if you experience unpleasant side effects which may include dry mouth, dizziness or visual disturbances. 3. Avoid touching the patch. Wash your hands with soap and water after contact with the patch.

## 2022-04-04 NOTE — Op Note (Signed)
   Date of Surgery: 04/04/2022  INDICATIONS: Kelly Patton is a 52 y.o.-year-old female who presents for surgical treatment of stenosing tenosynovitis of left thumb ;  The patient did consent to the procedure after discussion of the risks and benefits.  PREOPERATIVE DIAGNOSIS: Stenosing tenosynovitis left thumb  POSTOPERATIVE DIAGNOSIS: Same.  PROCEDURE: Tenolysis of left FPL tendon with release of A1 pulley  SURGEON: N. Glee Arvin, M.D.  ASSIST: Starlyn Skeans [place], [place]; necessary for the timely completion of procedure and due to complexity of procedure..  ANESTHESIA:  Bier block  IV FLUIDS AND URINE: See anesthesia.  ESTIMATED BLOOD LOSS: minimal mL  COMPLICATIONS: None.  DESCRIPTION OF PROCEDURE: The patient was identified in the preoperative holding area. The operative site was marked by the surgeon confirmed with the patient. He is brought back to the operating room. She was placed supine on table. A nonsterile tourniquet was placed on the upper forearm. Local anesthetic was placed in the planned operative site. The operative extremity was prepped and draped in standard sterile fashion. Timeout was performed. Antibiotics were given. Timeout was performed.  Tourniquet was inflated to 250 mmHg.  An incision based in the palmar crease in line with the thumb was made. Blunt dissection was taken down to the level of the flexor tendon. The neurovascular bundles were identified on each side of the tendon sheath and protected. The proximal edge of the A1 pulley was identified. This was sharply incised. Tenolysis of the flexor tendon was performed with tenotomy scissors.  There were a few small interstitial tears of the FPL tendon.  A2 pulley was not incised. The palmar pulley was then visualized and released also. The tourniquet was then deflated and hemostasis was obtained.  The wound was thoroughly irrigated and closed with 3-0 nylon sutures. Sterile dressings were applied and the hand was  placed in a soft dressing. Patient tolerated the procedure well and was taken to the PACU in stable condition.  POSTOPERATIVE PLAN: Patient will be weight bearing as tolerated and to avoid heavy lifting for 4 weeks.    Kelly Reel, MD 12:46 PM

## 2022-04-04 NOTE — Transfer of Care (Signed)
Immediate Anesthesia Transfer of Care Note  Patient: Kelly Patton  Procedure(s) Performed: LEFT TRIGGER THUMB RELEASE (Left: Thumb)  Patient Location: PACU  Anesthesia Type:MAC  Level of Consciousness: awake, alert  and oriented  Airway & Oxygen Therapy: Patient Spontanous Breathing and Patient connected to face mask oxygen  Post-op Assessment: Report given to RN and Post -op Vital signs reviewed and stable  Post vital signs: Reviewed and stable  Last Vitals:   HR: 79 SpO2: 100% Vitals Value Taken Time  BP 139/79 04/04/22 1258  Temp    Pulse    Resp 14 04/04/22 1300  SpO2    Vitals shown include unvalidated device data.  Last Pain:  Vitals:   04/04/22 1013  TempSrc: Oral  PainSc: 0-No pain         Complications: No notable events documented.

## 2022-04-04 NOTE — Anesthesia Postprocedure Evaluation (Signed)
Anesthesia Post Note  Patient: Kelly Patton  Procedure(s) Performed: LEFT TRIGGER THUMB RELEASE (Left: Thumb)     Patient location during evaluation: PACU Anesthesia Type: MAC Level of consciousness: awake and alert Pain management: pain level controlled Vital Signs Assessment: post-procedure vital signs reviewed and stable Respiratory status: spontaneous breathing, nonlabored ventilation and respiratory function stable Cardiovascular status: stable and blood pressure returned to baseline Anesthetic complications: no   No notable events documented.  Last Vitals:  Vitals:   04/04/22 1300 04/04/22 1315  BP: 128/84 135/86  Pulse:  88  Resp: 14 (!) 21  Temp:    SpO2: 100% 95%    Last Pain:  Vitals:   04/04/22 1315  TempSrc:   PainSc: 0-No pain                 Audry Pili

## 2022-04-04 NOTE — Anesthesia Preprocedure Evaluation (Addendum)
Anesthesia Evaluation  Patient identified by MRN, date of birth, ID band Patient awake    Reviewed: Allergy & Precautions, NPO status , Patient's Chart, lab work & pertinent test results  History of Anesthesia Complications Negative for: history of anesthetic complications  Airway Mallampati: I  TM Distance: >3 FB Neck ROM: Full    Dental  (+) Dental Advisory Given, Chipped, Missing   Pulmonary Current Smoker and Patient abstained from smoking., former smoker,    Pulmonary exam normal        Cardiovascular hypertension, Pt. on medications Normal cardiovascular exam     Neuro/Psych PSYCHIATRIC DISORDERS Anxiety Depression  Neuromuscular disease    GI/Hepatic GERD  Controlled,(+)     substance abuse  alcohol use and marijuana use,   Endo/Other  negative endocrine ROS Obesity   Renal/GU negative Renal ROS     Musculoskeletal  (+) Arthritis ,   Abdominal   Peds  Hematology negative hematology ROS (+)   Anesthesia Other Findings   Reproductive/Obstetrics                           Anesthesia Physical Anesthesia Plan  ASA: 2  Anesthesia Plan: MAC   Post-op Pain Management: Minimal or no pain anticipated   Induction:   PONV Risk Score and Plan: 2 and Propofol infusion and Treatment may vary due to age or medical condition  Airway Management Planned: Natural Airway and Simple Face Mask  Additional Equipment: None  Intra-op Plan:   Post-operative Plan:   Informed Consent: I have reviewed the patients History and Physical, chart, labs and discussed the procedure including the risks, benefits and alternatives for the proposed anesthesia with the patient or authorized representative who has indicated his/her understanding and acceptance.       Plan Discussed with: CRNA and Anesthesiologist  Anesthesia Plan Comments:       Anesthesia Quick Evaluation

## 2022-04-05 ENCOUNTER — Encounter (HOSPITAL_BASED_OUTPATIENT_CLINIC_OR_DEPARTMENT_OTHER): Payer: Self-pay | Admitting: Orthopaedic Surgery

## 2022-04-06 ENCOUNTER — Other Ambulatory Visit: Payer: Self-pay | Admitting: Physician Assistant

## 2022-04-06 ENCOUNTER — Telehealth: Payer: Self-pay | Admitting: Orthopaedic Surgery

## 2022-04-06 MED ORDER — OXYCODONE HCL 5 MG PO TABS: 5.0000 mg | ORAL_TABLET | Freq: Three times a day (TID) | ORAL | 0 refills | Status: DC | PRN

## 2022-04-06 NOTE — Telephone Encounter (Signed)
Patient called says the pain medication is not working for her. Would like something called in to [facility] on [address].

## 2022-04-06 NOTE — Telephone Encounter (Signed)
Sent in oxycodone

## 2022-04-12 ENCOUNTER — Ambulatory Visit (INDEPENDENT_AMBULATORY_CARE_PROVIDER_SITE_OTHER): Payer: 59 | Admitting: Physician Assistant

## 2022-04-12 ENCOUNTER — Encounter: Payer: Self-pay | Admitting: Orthopaedic Surgery

## 2022-04-12 DIAGNOSIS — M65312 Trigger thumb, left thumb: Secondary | ICD-10-CM

## 2022-04-12 NOTE — Progress Notes (Signed)
Post-Op Visit Note   Patient: Kelly Patton           Date of Birth: 10-Jul-1971           MRN: [id number] Visit Date: 04/12/2022 PCP: [name], MD   Assessment & Plan:  Chief Complaint:  Chief Complaint  Patient presents with   Left Hand - Follow-up    Left trigger thumb release 04/05/2022   Visit Diagnoses:  1. Trigger thumb, left thumb     Plan: Patient is a pleasant 51 year old female who comes in today 1 week status post left trigger thumb release 04/05/2022.  She has been doing well.  She has been taking oxycodone as needed.  She denies any triggering.  No paresthesias.  Examination of her left thumb reveals a fully healed surgical incision with nylon sutures in place.  No evidence of infection or cellulitis.  Fingers warm well perfused.  She is neurovascular tact distally.  Today the wound was cleaned and recovered.  She will follow-up in 1 week for suture removal.  Call with concerns or  Follow-Up Instructions: Return in about 1 week (around 04/19/2022).   Orders:  No orders of the defined types were placed in this encounter.  No orders of the defined types were placed in this encounter.   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 11/21/2021   PTSD (post-traumatic stress disorder) 12/28/2020   Substance abuse (HCC) 12/16/2020   Alcohol dependence with unspecified alcohol-induced disorder (HCC) 12/16/2020   Left carpal tunnel syndrome 08/10/2020   Primary osteoarthritis of first carpometacarpal joint of left hand 08/10/2020   History of 2019 novel coronavirus disease (COVID-19) 07/19/2020   Arthritis 07/19/2020   Lumbar back pain 11/11/2019   Trigger thumb, right thumb 12/09/2017   Essential hypertension 07/17/2017   Primary insomnia 07/17/2017   Past Medical History:  Diagnosis Date   Allergies    Anxiety    Arthritis    Carpal tunnel  syndrome, right    Depression    ETOH abuse    daily marijuana   GERD (gastroesophageal reflux disease)    Hypertension    Shortness of breath dyspnea    with exertion uses inhaler    Family History  Problem Relation Age of Onset   Hypertension Father    Hypertension Paternal Grandmother    Hypertension Paternal Grandfather    Alcoholism Paternal Grandfather    Hypertension Brother    Breast cancer Neg Hx     Past Surgical History:  Procedure Laterality Date   CARPAL TUNNEL RELEASE Right 01/31/2016   Procedure: CARPAL TUNNEL RELEASE;  Surgeon: Cammy Copa, MD;  Location: [facility];  Service: Orthopedics;  Laterality: Right;   FINGER SURGERY Right    ring finger, due to knife cut   HAND TENDON SURGERY Right    TRIGGER FINGER RELEASE Right 11/26/2017   Procedure: RELEASE TRIGGER FINGER RIGHT THUMB;  Surgeon: Cammy Copa, MD;  Location: [facility] OR;  Service: Orthopedics;  Laterality: Right;   TRIGGER FINGER RELEASE Left 04/04/2022   Procedure: LEFT TRIGGER THUMB RELEASE;  Surgeon: Tarry Kos, MD;  Location: [facility];  Service: Orthopedics;  Laterality: Left;   Social History   Occupational History   Occupation: Location manager   Occupation: Presenter, broadcasting: [employer]  Tobacco Use   Smoking status: Former    Types:  Cigarettes   Smokeless tobacco: Never  Vaping Use   Vaping Use: Never used  Substance and Sexual Activity   Alcohol use: Yes    Alcohol/week: 40.0 standard drinks of alcohol    Types: 40 Shots of liquor per week    Comment: drinks 2-3 shots liquor/day   Drug use: Yes    Frequency: 1.0 times per week    Types: Marijuana   Sexual activity: Yes    Comment: female partners only

## 2022-04-20 ENCOUNTER — Ambulatory Visit (INDEPENDENT_AMBULATORY_CARE_PROVIDER_SITE_OTHER): Payer: 59 | Admitting: Physician Assistant

## 2022-04-20 DIAGNOSIS — M65312 Trigger thumb, left thumb: Secondary | ICD-10-CM

## 2022-04-20 NOTE — Progress Notes (Signed)
Post-Op Visit Note   Patient: Kelly Patton           Date of Birth: [birth year]           MRN: [id number] Visit Date: 04/20/2022 PCP: [name], MD   Assessment & Plan:  Chief Complaint:  Chief Complaint  Patient presents with   Left Hand - Routine Post Op   Visit Diagnoses:  1. Trigger thumb, left thumb     Plan: Patient is a pleasant 51 year old female who comes in today 2 weeks status post left trigger thumb release 04/04/2022.  She has been doing well.  She notes mild discomfort but nothing more.  Examination of her left thumb reveals a fully healed surgical incision with nylon sutures in place.  No evidence of infection or cellulitis.  Fingers are warm and well-perfused.  She is neurovascular intact distally.  Today, sutures were removed and Steri-Strips applied.  No heavy lifting or submerging her hand underwater for another 2 weeks.  Follow-up in 4 weeks for recheck.  Follow-Up Instructions: Return in about 4 weeks (around 05/18/2022).   Orders:  No orders of the defined types were placed in this encounter.  No orders of the defined types were placed in this encounter.   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 11/21/2021   PTSD (post-traumatic stress disorder) 12/28/2020   Substance abuse (HCC) 12/16/2020   Alcohol dependence with unspecified alcohol-induced disorder (HCC) 12/16/2020   Left carpal tunnel syndrome 08/10/2020   Primary osteoarthritis of first carpometacarpal joint of left hand 08/10/2020   History of 2019 novel coronavirus disease (COVID-19) 07/19/2020   Arthritis 07/19/2020   Lumbar back pain 11/11/2019   Trigger thumb, right thumb 12/09/2017   Essential hypertension 07/17/2017   Primary insomnia 07/17/2017   Past Medical History:  Diagnosis Date   Allergies    Anxiety    Arthritis    Carpal tunnel syndrome, right     Depression    ETOH abuse    daily marijuana   GERD (gastroesophageal reflux disease)    Hypertension    Shortness of breath dyspnea    with exertion uses inhaler    Family History  Problem Relation Age of Onset   Hypertension Father    Hypertension Paternal Grandmother    Hypertension Paternal Grandfather    Alcoholism Paternal Grandfather    Hypertension Brother    Breast cancer Neg Hx     Past Surgical History:  Procedure Laterality Date   CARPAL TUNNEL RELEASE Right 01/31/2016   Procedure: CARPAL TUNNEL RELEASE;  Surgeon: Cammy Copa, MD;  Location: [facility];  Service: Orthopedics;  Laterality: Right;   FINGER SURGERY Right    ring finger, due to knife cut   HAND TENDON SURGERY Right    TRIGGER FINGER RELEASE Right 11/26/2017   Procedure: RELEASE TRIGGER FINGER RIGHT THUMB;  Surgeon: Cammy Copa, MD;  Location[facility] OR;  Service: Orthopedics;  Laterality: Right;   TRIGGER FINGER RELEASE Left 04/04/2022   Procedure: LEFT TRIGGER THUMB RELEASE;  Surgeon: Tarry Kos, MD;  Location: [facility];  Service: Orthopedics;  Laterality: Left;   Social History   Occupational History   Occupation: Location manager   Occupation: Presenter, broadcasting: [employer]  Tobacco Use   Smoking status: Former    Types: Cigarettes   Smokeless tobacco: Never  Vaping  Use   Vaping Use: Never used  Substance and Sexual Activity   Alcohol use: Yes    Alcohol/week: 40.0 standard drinks of alcohol    Types: 40 Shots of liquor per week    Comment: drinks 2-3 shots liquor/day   Drug use: Yes    Frequency: 1.0 times per week    Types: Marijuana   Sexual activity: Yes    Comment: female partners only

## 2022-04-27 ENCOUNTER — Telehealth: Payer: Self-pay | Admitting: Family Medicine

## 2022-04-27 DIAGNOSIS — N951 Menopausal and female climacteric states: Secondary | ICD-10-CM

## 2022-04-27 MED ORDER — GABAPENTIN 300 MG PO CAPS: 300.0000 mg | ORAL_CAPSULE | Freq: Three times a day (TID) | ORAL | 1 refills | Status: DC | PRN

## 2022-04-27 NOTE — Telephone Encounter (Signed)
Pt called to request a refill of:  gabapentin (NEURONTIN) 300 MG capsule  LOV:  01/11/2022  [id number] - [place], [place] - [address] Phone:  [phone]  Fax:  [phone]      Please advise.

## 2022-04-27 NOTE — Telephone Encounter (Signed)
Refill sent to requested pharmacy.

## 2022-05-16 ENCOUNTER — Encounter: Payer: Self-pay | Admitting: Orthopaedic Surgery

## 2022-05-16 ENCOUNTER — Other Ambulatory Visit: Payer: Self-pay | Admitting: Physician Assistant

## 2022-05-16 ENCOUNTER — Ambulatory Visit (INDEPENDENT_AMBULATORY_CARE_PROVIDER_SITE_OTHER): Payer: 59 | Admitting: Physician Assistant

## 2022-05-16 DIAGNOSIS — M65312 Trigger thumb, left thumb: Secondary | ICD-10-CM

## 2022-05-16 MED ORDER — PREDNISONE 5 MG (21) PO TBPK: ORAL_TABLET | ORAL | 0 refills | Status: DC

## 2022-05-16 MED ORDER — TRAMADOL HCL 50 MG PO TABS: 50.0000 mg | ORAL_TABLET | Freq: Two times a day (BID) | ORAL | 0 refills | Status: DC | PRN

## 2022-05-16 NOTE — Progress Notes (Signed)
Post-Op Visit Note   Patient: Kelly Patton           Date of Birth: [birth year]           MRN: [id number] Visit Date: 05/16/2022 PCP: [name], MD   Assessment & Plan:  Chief Complaint:  Chief Complaint  Patient presents with   Left Hand - Follow-up    Trigger thumb release 04/04/2022   Visit Diagnoses:  1. Trigger thumb, left thumb     Plan: Patient is a pleasant 51 year old female who comes in today approximately 6 weeks status post left trigger thumb release 04/04/2022.  She was doing great till about 3 hours ago.  She notes that she grabbed her book bag with her left hand like she always does and had sudden pain to the base of her left thumb.  Pain she has is worse with any movement.  No fevers or chills or any other constitutional symptoms.  Left thumb exam shows a fully healed surgical scar without complication.  Very minimal tenderness to the base of the thumb.  She is not able to make a full composite fist.  She has slight limitation with extension of the thumb.  She is neurovascular intact distally.  At this point, I am not concerned for injury.  I recommended relative rest as well as a steroid pack which I have sent in.  If her symptoms do not improve once she is finished with this pack or worsen in the meantime, she will let us know.  Follow-Up Instructions: Return if symptoms worsen or fail to improve.   Orders:  No orders of the defined types were placed in this encounter.  No orders of the defined types were placed in this encounter.   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 11/21/2021   PTSD (post-traumatic stress disorder) 12/28/2020   Substance abuse (HCC) 12/16/2020   Alcohol dependence with unspecified alcohol-induced disorder (HCC) 12/16/2020   Left carpal tunnel syndrome 08/10/2020   Primary osteoarthritis of first carpometacarpal joint  of left hand 08/10/2020   History of 2019 novel coronavirus disease (COVID-19) 07/19/2020   Arthritis 07/19/2020   Lumbar back pain 11/11/2019   Trigger thumb, right thumb 12/09/2017   Essential hypertension 07/17/2017   Primary insomnia 07/17/2017   Past Medical History:  Diagnosis Date   Allergies    Anxiety    Arthritis    Carpal tunnel syndrome, right    Depression    ETOH abuse    daily marijuana   GERD (gastroesophageal reflux disease)    Hypertension    Shortness of breath dyspnea    with exertion uses inhaler    Family History  Problem Relation Age of Onset   Hypertension Father    Hypertension Paternal Grandmother    Hypertension Paternal Grandfather    Alcoholism Paternal Grandfather    Hypertension Brother    Breast cancer Neg Hx     Past Surgical History:  Procedure Laterality Date   CARPAL TUNNEL RELEASE Right 01/31/2016   Procedure: CARPAL TUNNEL RELEASE;  Surgeon: Cammy Copa, MD;  Location: [facility];  Service: Orthopedics;  Laterality: Right;   FINGER SURGERY Right    ring finger, due to knife cut   HAND TENDON SURGERY Right    TRIGGER FINGER RELEASE Right 11/26/2017   Procedure: RELEASE TRIGGER FINGER RIGHT THUMB;  Surgeon: Cammy Copa, MD;  Location: [facility];  Service: Orthopedics;  Laterality: Right;   TRIGGER FINGER RELEASE Left 04/04/2022   Procedure: LEFT TRIGGER THUMB RELEASE;  Surgeon: Tarry Kos, MD;  Location: [facility];  Service: Orthopedics;  Laterality: Left;   Social History   Occupational History   Occupation: Location manager   Occupation: Presenter, 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

## 2022-07-19 ENCOUNTER — Other Ambulatory Visit: Payer: 59

## 2022-07-24 ENCOUNTER — Other Ambulatory Visit: Payer: 59

## 2022-07-30 ENCOUNTER — Other Ambulatory Visit (INDEPENDENT_AMBULATORY_CARE_PROVIDER_SITE_OTHER): Payer: 59

## 2022-07-30 ENCOUNTER — Other Ambulatory Visit: Payer: 59

## 2022-07-30 DIAGNOSIS — E781 Pure hyperglyceridemia: Secondary | ICD-10-CM

## 2022-07-30 LAB — LIPID PANEL
Cholesterol: 155 mg/dL (ref 0–200)
HDL: 54.7 mg/dL (ref >39.00)
NonHDL: 99.99
Total CHOL/HDL Ratio: 3
Triglycerides: 317 mg/dL — ABNORMAL HIGH (ref 0.0–149.0)
VLDL: 63.4 mg/dL — ABNORMAL HIGH (ref 0.0–40.0)

## 2022-07-30 LAB — LDL CHOLESTEROL, DIRECT: Direct LDL: 51 mg/dL

## 2022-08-28 ENCOUNTER — Telehealth: Payer: Self-pay | Admitting: Family Medicine

## 2022-08-28 DIAGNOSIS — N951 Menopausal and female climacteric states: Secondary | ICD-10-CM

## 2022-08-28 MED ORDER — GABAPENTIN 300 MG PO CAPS: 300.0000 mg | ORAL_CAPSULE | Freq: Three times a day (TID) | ORAL | 1 refills | Status: DC | PRN

## 2022-08-28 NOTE — Telephone Encounter (Signed)
Patient needs a refill of Gabapentin.    Pharmacy- [facility] on [address].

## 2022-08-28 NOTE — Telephone Encounter (Signed)
Rx sent 

## 2022-08-29 ENCOUNTER — Telehealth (INDEPENDENT_AMBULATORY_CARE_PROVIDER_SITE_OTHER): Payer: 59 | Admitting: Family Medicine

## 2022-08-29 ENCOUNTER — Encounter: Payer: Self-pay | Admitting: Family Medicine

## 2022-08-29 VITALS — Ht 62.5 in | Wt 169.0 lb

## 2022-08-29 DIAGNOSIS — J029 Acute pharyngitis, unspecified: Secondary | ICD-10-CM | POA: Diagnosis not present

## 2022-08-29 DIAGNOSIS — R059 Cough, unspecified: Secondary | ICD-10-CM | POA: Diagnosis not present

## 2022-08-29 DIAGNOSIS — U071 COVID-19: Secondary | ICD-10-CM | POA: Diagnosis not present

## 2022-08-29 DIAGNOSIS — J020 Streptococcal pharyngitis: Secondary | ICD-10-CM

## 2022-08-29 LAB — POCT RAPID STREP A (OFFICE): Rapid Strep A Screen: POSITIVE — AB

## 2022-08-29 MED ORDER — AMOXICILLIN 500 MG PO TABS: 500.0000 mg | ORAL_TABLET | Freq: Two times a day (BID) | ORAL | 0 refills | Status: AC

## 2022-08-29 NOTE — Progress Notes (Signed)
Virtual Visit via Video Note  I connected with Kelly Patton on 08/29/22 at  4:30 PM EST by a video enabled telemedicine application 2/2 COVID-19 pandemic and verified that I am speaking with the correct person using two identifiers.  Location patient: home Location provider:work or home office Persons participating in the virtual visit: patient, provider  I discussed the limitations of evaluation and management by telemedicine and the availability of in person appointments. The patient expressed understanding and agreed to proceed. Chief Complaint  Patient presents with   Covid Positive    Pt reports sx of sore throat, nasal congestion, bodyache, fatigue, headache. Not sure if has chills or fever due to hotflash. Sx started last Friday.      HPI: Pt started feeling sick on last Friday after having a party that Thursday night.  Pt developed nasal congestion, sore throat, body ache, fatigue, headache.   COVID and strep test positive in clinic.   ROS: See pertinent positives and negatives per HPI.  Past Medical History:  Diagnosis Date   Allergies    Anxiety    Arthritis    Carpal tunnel syndrome, right    Depression    ETOH abuse    daily marijuana   GERD (gastroesophageal reflux disease)    Hypertension    Shortness of breath dyspnea    with exertion uses inhaler    Past Surgical History:  Procedure Laterality Date   CARPAL TUNNEL RELEASE Right 01/31/2016   Procedure: CARPAL TUNNEL RELEASE;  Surgeon: Cammy Copa, MD;  Location: [facility];  Service: Orthopedics;  Laterality: Right;   FINGER SURGERY Right    ring finger, due to knife cut   HAND TENDON SURGERY Right    TRIGGER FINGER RELEASE Right 11/26/2017   Procedure: RELEASE TRIGGER FINGER RIGHT THUMB;  Surgeon: Cammy Copa, MD;  Location: [facility];  Service: Orthopedics;  Laterality: Right;   TRIGGER FINGER RELEASE Left 04/04/2022   Procedure: LEFT TRIGGER THUMB RELEASE;  Surgeon: Tarry Kos, MD;  [facility];  Service: Orthopedics;  Laterality: Left;    Family History  Problem Relation Age of Onset   Hypertension Father    Hypertension Paternal Grandmother    Hypertension Paternal Grandfather    Alcoholism Paternal Grandfather    Hypertension Brother    Breast cancer Neg Hx    Current Outpatient Medications:    albuterol (VENTOLIN HFA) 108 (90 Base) MCG/ACT inhaler, Inhale 2 puffs into the lungs every 6 (six) hours as needed for wheezing or shortness of breath., Disp: 8 g, Rfl: 0   chlorthalidone (HYGROTON) 25 MG tablet, Take 1 tablet (25 mg total) by mouth daily., Disp: 90 tablet, Rfl: 3   cyclobenzaprine (FLEXERIL) 5 MG tablet, Take 1 tablet (5 mg total) by mouth at bedtime as needed for muscle spasms., Disp: 30 tablet, Rfl: 0   fexofenadine (ALLEGRA) 180 MG tablet, Take 1 tablet (180 mg total) by mouth daily., Disp: 30 tablet, Rfl: 3   gabapentin (NEURONTIN) 300 MG capsule, Take 1 capsule (300 mg total) by mouth 3 (three) times daily as needed., Disp: 90 capsule, Rfl: 1   lidocaine (LIDODERM) 5 %, Place 1 patch onto the skin daily. Remove & Discard patch within 12 hours or as directed by MD, Disp: 30 patch, Rfl: 0   oxyCODONE (ROXICODONE) 5 MG immediate release tablet, Take 1 tablet (5 mg total) by mouth 3 (three) times daily as needed for severe pain., Disp: 20 tablet, Rfl: 0  predniSONE (STERAPRED UNI-PAK 21 TAB) 5 MG (21) TBPK tablet, Take as directed, Disp: 21 tablet, Rfl: 0   traMADol (ULTRAM) 50 MG tablet, Take 1 tablet (50 mg total) by mouth every 12 (twelve) hours as needed., Disp: 20 tablet, Rfl: 0  EXAM:  VITALS per patient if applicable: RR between 12-20 bpm  GENERAL: alert, oriented, appears well and in no acute distress  HEENT: atraumatic, conjunctiva clear, no obvious abnormalities on inspection of external nose and ears  NECK: normal movements of the head and neck  LUNGS: on inspection no signs of respiratory distress, breathing rate appears  normal, no obvious gross SOB, gasping or wheezing  CV: no obvious cyanosis  MS: moves all visible extremities without noticeable abnormality  PSYCH/NEURO: pleasant and cooperative, no obvious depression or anxiety, speech and thought processing grossly intact  ASSESSMENT AND PLAN:  Discussed the following assessment and plan:  Strep pharyngitis  - Plan: amoxicillin (AMOXIL) 500 MG tablet  COVID-19 virus infection  Cough, unspecified type - Plan: POC COVID-19  Sore throat - Plan: POC Rapid Strep A  Positive COVID and strep testing in clinic.  Just outside of window for antiviral medications.  Start ABX for strep, amoxicillin.  Discussed expectant management of symptoms with OTC cough/cold medications, gargling with warm salt water or Chloraseptic spray, Tylenol or NSAIDs as needed.  Discussed quarantine and likely duration of symptoms.  Note for work written however clinic out of paper for prior and patient does not have MyChart set up.  Offered to recent [employer].  Patient declines at this time.  Can try printing and faxing note or mailing if needed tomorrow.  Given strict precautions.   I discussed the assessment and treatment plan with the patient. The patient was provided an opportunity to ask questions and all were answered. The patient agreed with the plan and demonstrated an understanding of the instructions.   The patient was advised to call back or seek an in-person evaluation if the symptoms worsen or if the condition fails to improve as anticipated.    [name], MD

## 2022-10-14 IMAGING — MG MM DIGITAL SCREENING BILAT W/ TOMO AND CAD
8 series · 8 of 24 positions shown · non-contrast
Comparison: None

CLINICAL DATA: Screening. Baseline examination.

EXAM:
DIGITAL SCREENING BILATERAL MAMMOGRAM WITH TOMOSYNTHESIS AND CAD
TECHNIQUE: Bilateral screening digital craniocaudal and mediolateral oblique
mammograms were obtained. Bilateral screening digital breast
tomosynthesis was performed. The images were evaluated with
computer-aided detection.

[L MLO synth-2D]
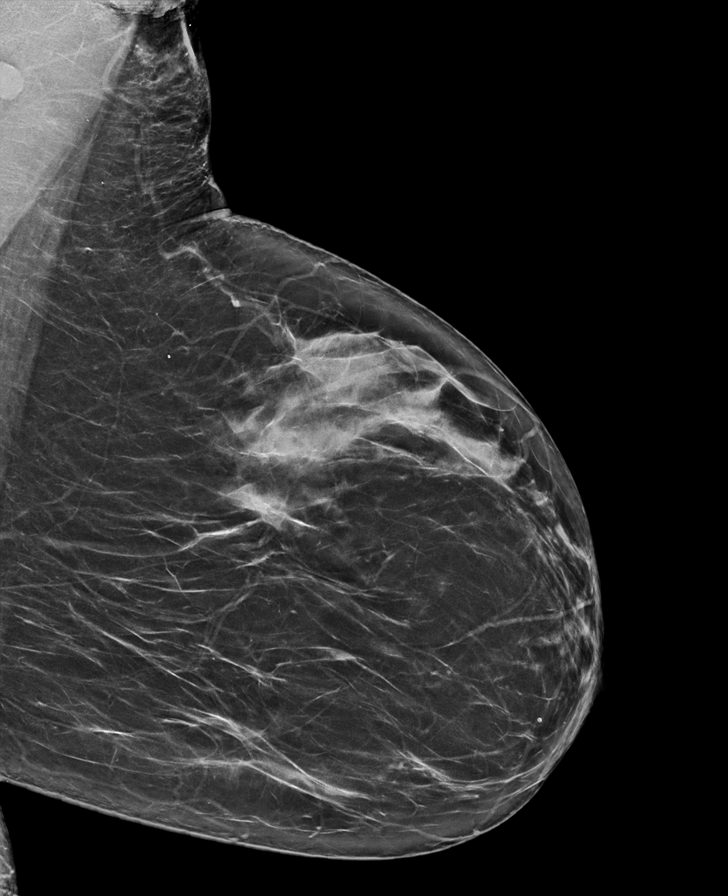

[L CC synth-2D]
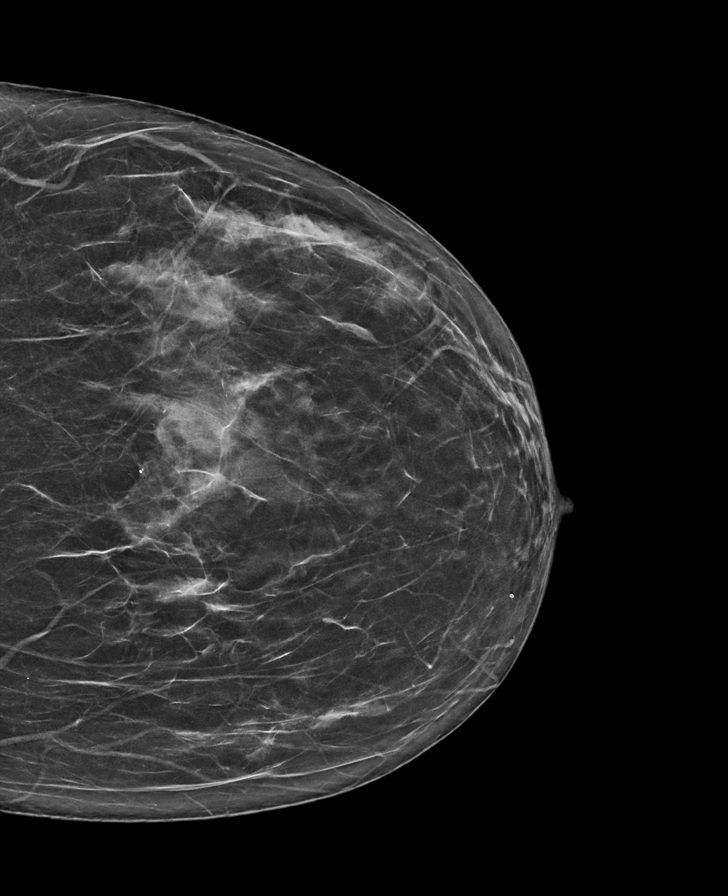

[R CC synth-2D]
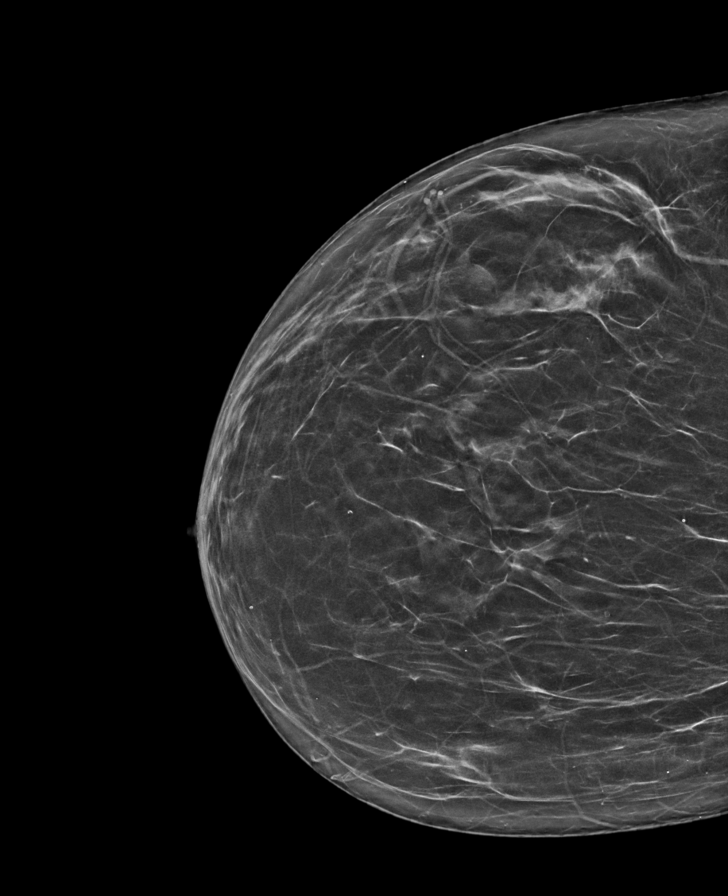

[R MLO synth-2D]
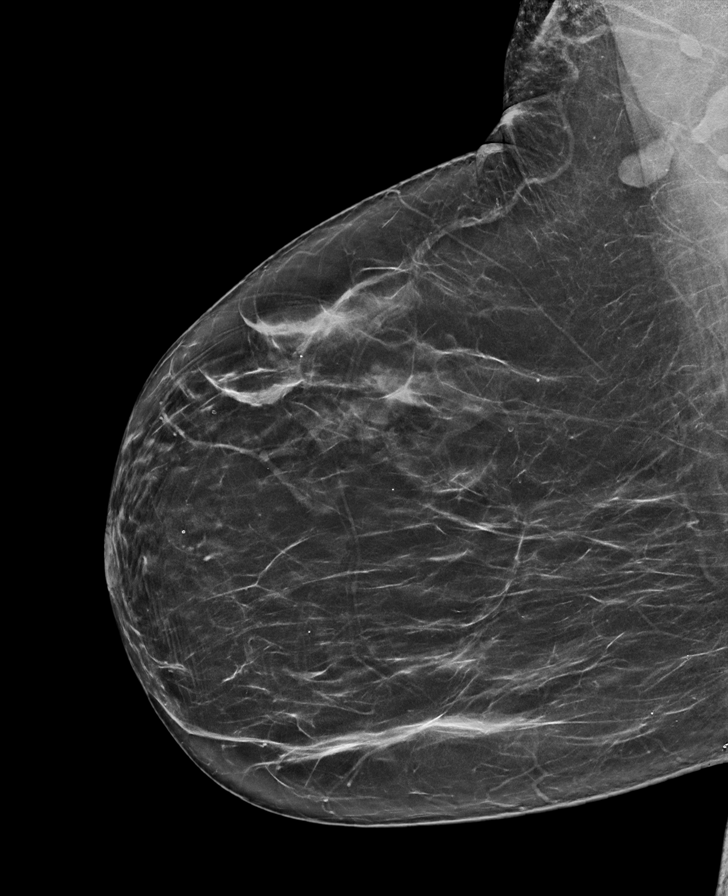

[L MLO tomo · tomo slice 39/78.0]
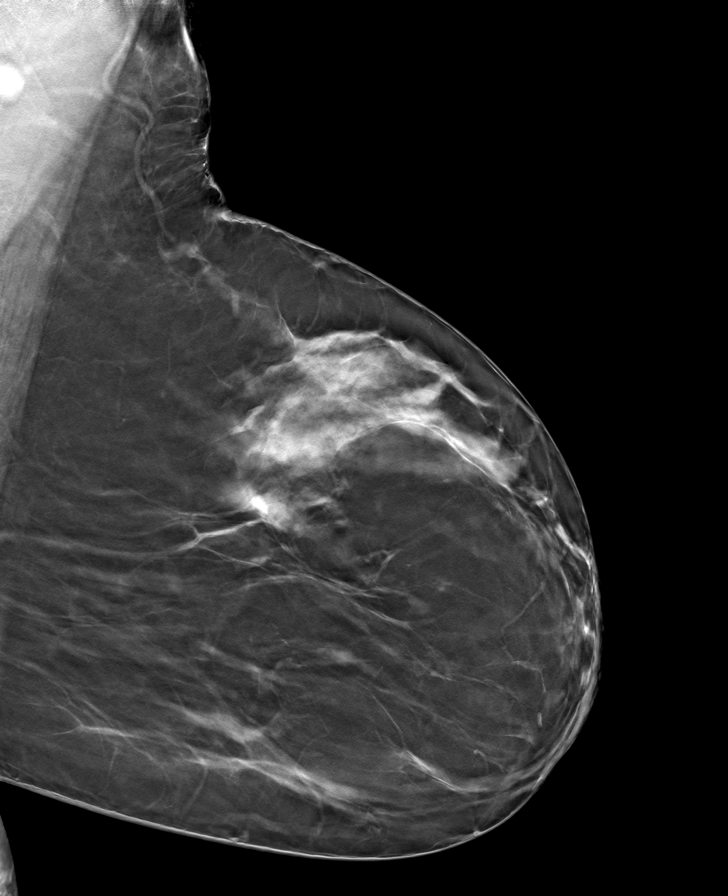

[R MLO tomo · tomo slice 38/75.0]
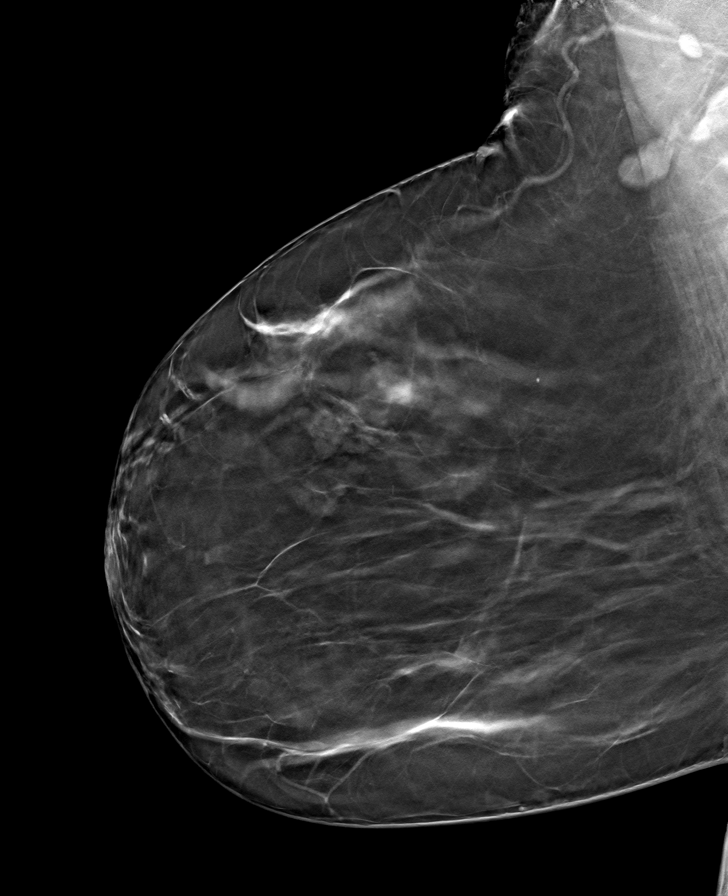

[R CC tomo · tomo slice 33/66.0]
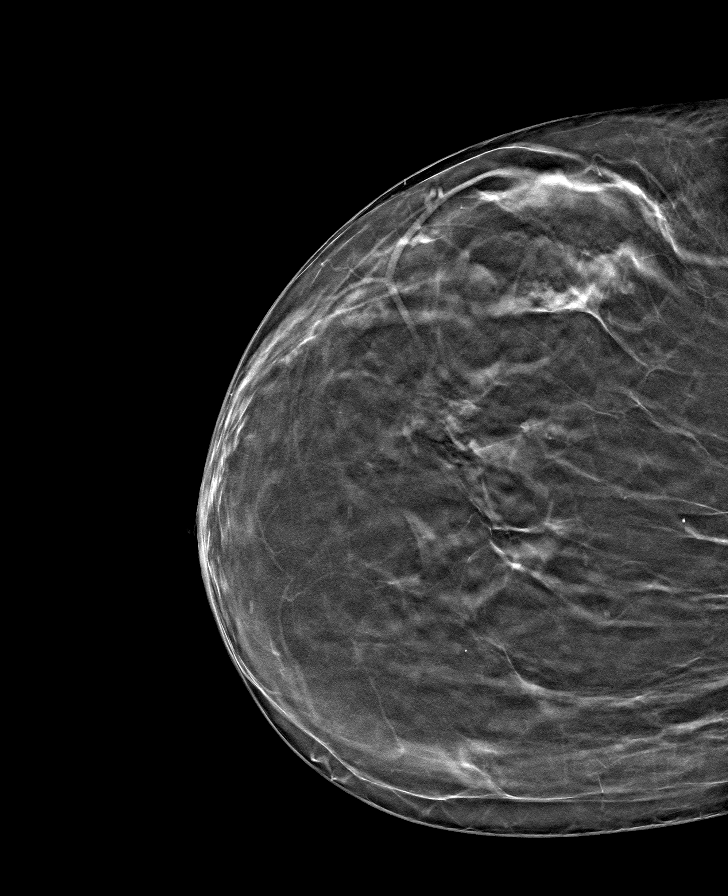

[L CC tomo · tomo slice 33/65.0]
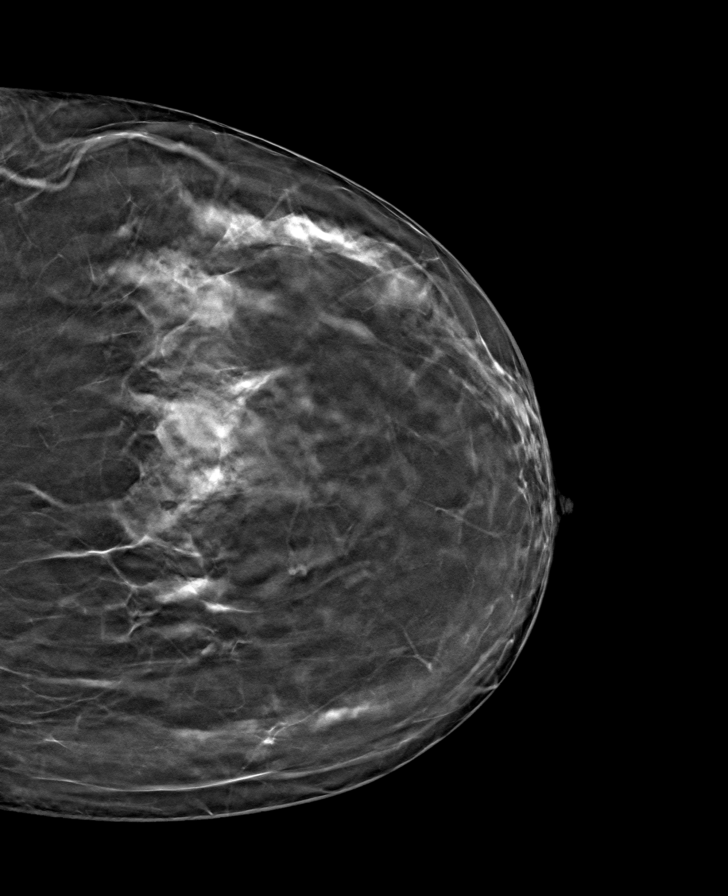

[8 of 24 positions shown; findings below may reference images not displayed]

ACR Breast Density Category b: There are scattered areas of
fibroglandular density.
FINDINGS: In the left breast, a possible asymmetry warrants further
evaluation. In the right breast, no findings suspicious for
malignancy.
IMPRESSION: Further evaluation is suggested for possible asymmetry in the left
breast.

RECOMMENDATION:
Diagnostic mammogram and possibly ultrasound of the left breast.
(Code:GN-L-BBB)

The patient will be contacted regarding the findings, and additional
imaging will be scheduled.

BI-RADS CATEGORY  0: Incomplete. Need additional imaging evaluation
and/or prior mammograms for comparison.

## 2022-11-28 ENCOUNTER — Encounter: Payer: Self-pay | Admitting: Family Medicine

## 2022-11-28 ENCOUNTER — Telehealth (INDEPENDENT_AMBULATORY_CARE_PROVIDER_SITE_OTHER): Payer: 59 | Admitting: Family Medicine

## 2022-11-28 DIAGNOSIS — M545 Low back pain, unspecified: Secondary | ICD-10-CM | POA: Diagnosis not present

## 2022-11-28 DIAGNOSIS — M62830 Muscle spasm of back: Secondary | ICD-10-CM

## 2022-11-28 MED ORDER — TRAMADOL HCL 50 MG PO TABS: ORAL_TABLET | ORAL | 0 refills | Status: DC

## 2022-11-28 MED ORDER — CYCLOBENZAPRINE HCL 5 MG PO TABS: 5.0000 mg | ORAL_TABLET | Freq: Every evening | ORAL | 0 refills | Status: DC | PRN

## 2022-11-28 NOTE — Progress Notes (Signed)
Virtual Visit via Video Note  I connected with Kelly Patton on 11/28/22 at  3:45 PM EDT by a video enabled telemedicine application and verified that I am speaking with the correct person using two identifiers.  Location patient: home Location provider:work or home office Persons participating in the virtual visit: patient, provider  I discussed the limitations of evaluation and management by telemedicine and the availability of in person appointments. The patient expressed understanding and agreed to proceed.   HPI: Pt is 52 yo female with pmh sig for anxiety, allergies, GERD, carpal tunnel, h/o depression, EtOH abuse, HTN who was seen for acute concern.  Pt with R sided low back pain x 2 days.  Symptoms gradually increasing.  Tried ibuprofen and tramadol without relief.  Feels like a contraction, hurts then stops, like a knot.  Pain does not go into legs.  Denies loss of bowel or bladder, injury, heavy lifting, pushing/pulling.    ROS: See pertinent positives and negatives per HPI.  Past Medical History:  Diagnosis Date   Allergies    Anxiety    Arthritis    Carpal tunnel syndrome, right    Depression    ETOH abuse    daily marijuana   GERD (gastroesophageal reflux disease)    Hypertension    Shortness of breath dyspnea    with exertion uses inhaler    Past Surgical History:  Procedure Laterality Date   CARPAL TUNNEL RELEASE Right 01/31/2016   Procedure: CARPAL TUNNEL RELEASE;  Surgeon: Meredith Pel, MD;  Location: [place];  Service: Orthopedics;  Laterality: Right;   FINGER SURGERY Right    ring finger, due to knife cut   HAND TENDON SURGERY Right    TRIGGER FINGER RELEASE Right 11/26/2017   Procedure: RELEASE TRIGGER FINGER RIGHT THUMB;  Surgeon: Meredith Pel, MD;  Location: [place];  Service: Orthopedics;  Laterality: Right;   TRIGGER FINGER RELEASE Left 04/04/2022   Procedure: LEFT TRIGGER THUMB RELEASE;  Surgeon: Leandrew Koyanagi, MD;  Location: [place];  Service: Orthopedics;  Laterality: Left;    Family History  Problem Relation Age of Onset   Hypertension Father    Hypertension Paternal Grandmother    Hypertension Paternal Grandfather    Alcoholism Paternal Grandfather    Hypertension Brother    Breast cancer Neg Hx     Current Outpatient Medications:    albuterol (VENTOLIN HFA) 108 (90 Base) MCG/ACT inhaler, Inhale 2 puffs into the lungs every 6 (six) hours as needed for wheezing or shortness of breath., Disp: 8 g, Rfl: 0   chlorthalidone (HYGROTON) 25 MG tablet, Take 1 tablet (25 mg total) by mouth daily., Disp: 90 tablet, Rfl: 3   cyclobenzaprine (FLEXERIL) 5 MG tablet, Take 1 tablet (5 mg total) by mouth at bedtime as needed for muscle spasms. (Patient not taking: Reported on 08/29/2022), Disp: 30 tablet, Rfl: 0   fexofenadine (ALLEGRA) 180 MG tablet, Take 1 tablet (180 mg total) by mouth daily. (Patient not taking: Reported on 08/29/2022), Disp: 30 tablet, Rfl: 3   gabapentin (NEURONTIN) 300 MG capsule, Take 1 capsule (300 mg total) by mouth 3 (three) times daily as needed., Disp: 90 capsule, Rfl: 1   lidocaine (LIDODERM) 5 %, Place 1 patch onto the skin daily. Remove & Discard patch within 12 hours or as directed by MD (Patient not taking: Reported on 08/29/2022), Disp: 30 patch, Rfl: 0   traMADol (ULTRAM) 50 MG tablet, Take 1 tablet (50 mg total) by  mouth every 12 (twelve) hours as needed. (Patient not taking: Reported on 08/29/2022), Disp: 20 tablet, Rfl: 0  EXAM:  VITALS per patient if applicable:  RR between 12-20 bpm  GENERAL: alert, oriented, appears well and in no acute distress  HEENT: atraumatic, conjunctiva clear, no obvious abnormalities on inspection of external nose and ears  NECK: normal movements of the head and neck  LUNGS: on inspection no signs of respiratory distress, breathing rate appears normal, no obvious gross SOB, gasping or wheezing  CV: no obvious cyanosis  MS: moves all visible  extremities without noticeable abnormality  PSYCH/NEURO: pleasant and cooperative, no obvious depression or anxiety, speech and thought processing grossly intact  ASSESSMENT AND PLAN:  Discussed the following assessment and plan:  Acute right-sided low back pain without sciatica  -likely due to muscle strain -continue supportive care including heat, massage, stretching, topical analgesics -flexeril qhs prn and tramadol prn.  Can alternate with OTC tylenol or NSAIDs sparingly. -for continued symptoms f/u with Ortho. - Plan: cyclobenzaprine (FLEXERIL) 5 MG tablet, traMADol (ULTRAM) 50 MG tablet  Back spasm  - Plan: cyclobenzaprine (FLEXERIL) 5 MG tablet  F/u prn   I discussed the assessment and treatment plan with the patient. The patient was provided an opportunity to ask questions and all were answered. The patient agreed with the plan and demonstrated an understanding of the instructions.   The patient was advised to call back or seek an in-person evaluation if the symptoms worsen or if the condition fails to improve as anticipated.   Billie Ruddy, MD

## 2022-11-29 ENCOUNTER — Telehealth: Payer: Self-pay | Admitting: Family Medicine

## 2022-11-29 NOTE — Telephone Encounter (Signed)
Pt called again to follow up on this request, wondering if email was an option.  Pt was reminded that we could also upload doctors note to her MyChart but Pt states she has tried everything and cannot get in.   Pt added that she would be willing to come in person, tomorrow or Monday, to pick up the doctors note, if email was not possible.  Please advise.

## 2022-11-29 NOTE — Telephone Encounter (Signed)
Requesting a work excuse to cover today and tomorrow. Had VV 11/28/22 requests excuse be sent via email [email or web address]

## 2022-11-30 ENCOUNTER — Emergency Department (HOSPITAL_COMMUNITY)
Admission: EM | Admit: 2022-11-30 | Discharge: 2022-11-30 | Disposition: A | Discharge status: Home / Self Care | Payer: 59 | Attending: Emergency Medicine | Admitting: Emergency Medicine

## 2022-11-30 ENCOUNTER — Encounter (HOSPITAL_COMMUNITY): Payer: Self-pay

## 2022-11-30 DIAGNOSIS — M545 Low back pain, unspecified: Secondary | ICD-10-CM

## 2022-11-30 DIAGNOSIS — M7918 Myalgia, other site: Secondary | ICD-10-CM | POA: Diagnosis not present

## 2022-11-30 DIAGNOSIS — M62830 Muscle spasm of back: Secondary | ICD-10-CM

## 2022-11-30 DIAGNOSIS — M533 Sacrococcygeal disorders, not elsewhere classified: Secondary | ICD-10-CM | POA: Diagnosis not present

## 2022-11-30 MED ORDER — CYCLOBENZAPRINE HCL 10 MG PO TABS: 10.0000 mg | ORAL_TABLET | Freq: Every day | ORAL | 0 refills | Status: AC

## 2022-11-30 MED ORDER — IBUPROFEN 600 MG PO TABS: 600.0000 mg | ORAL_TABLET | Freq: Four times a day (QID) | ORAL | 0 refills | Status: DC | PRN

## 2022-11-30 MED ORDER — KETOROLAC TROMETHAMINE 15 MG/ML IJ SOLN
15.0000 mg | Freq: Once | INTRAMUSCULAR | Status: AC
  Administered 2022-11-30: 15 mg via INTRAMUSCULAR
  Filled 2022-11-30: qty 1

## 2022-11-30 MED ORDER — ACETAMINOPHEN 500 MG PO TABS: 500.0000 mg | ORAL_TABLET | Freq: Four times a day (QID) | ORAL | 0 refills | Status: DC | PRN

## 2022-11-30 MED ORDER — LIDOCAINE 5 % EX PTCH: 1.0000 | MEDICATED_PATCH | CUTANEOUS | 0 refills | Status: DC

## 2022-11-30 MED ORDER — METHYLPREDNISOLONE SODIUM SUCC 125 MG IJ SOLR
125.0000 mg | Freq: Once | INTRAMUSCULAR | Status: AC
  Administered 2022-11-30: 125 mg via INTRAMUSCULAR
  Filled 2022-11-30: qty 2

## 2022-11-30 NOTE — Telephone Encounter (Signed)
Ok to provide pt with note excusing her from work on 3/21 and 3/22 due to acute back pain.

## 2022-11-30 NOTE — ED Triage Notes (Signed)
Pt c/o right sided low back pain x several days; taking tramadol, Muscle relaxer without relief; denies urinary issues; denies known injury; states feels like a contraction

## 2022-11-30 NOTE — ED Provider Notes (Signed)
[facility] Provider Note   CSN: [id number] Arrival date & time: 11/30/22  1159     History No chief complaint on file.   Kelly Patton is a 52 y.o. female presenting with pain to the right buttocks.  She reports has been going on for several days.  Saw her PCP who gave her Flexeril and tramadol and she continues to have pain.  Does not radiate anywhere.  No hematuria, dysuria, vaginal or urinary symptoms.  No saddle anesthesia or bowel/bladder dysfunction.  HPI     Home Medications Prior to Admission medications   Medication Sig Start Date End Date Taking? Authorizing Provider  acetaminophen (TYLENOL) 500 MG tablet Take 1 tablet (500 mg total) by mouth every 6 (six) hours as needed. 11/30/22  Yes Abbygale Lapid A, PA-C  ibuprofen (ADVIL) 600 MG tablet Take 1 tablet (600 mg total) by mouth every 6 (six) hours as needed. 11/30/22  Yes Ozias Dicenzo A, PA-C  albuterol (VENTOLIN HFA) 108 (90 Base) MCG/ACT inhaler Inhale 2 puffs into the lungs every 6 (six) hours as needed for wheezing or shortness of breath. 08/11/21   Billie Ruddy, MD  chlorthalidone (HYGROTON) 25 MG tablet Take 1 tablet (25 mg total) by mouth daily. 01/11/22   Billie Ruddy, MD  cyclobenzaprine (FLEXERIL) 10 MG tablet Take 1 tablet (10 mg total) by mouth at bedtime for 14 days. 11/30/22 12/14/22  Buzz Axel A, PA-C  fexofenadine (ALLEGRA) 180 MG tablet Take 1 tablet (180 mg total) by mouth daily. Patient not taking: Reported on 08/29/2022 01/31/22   Billie Ruddy, MD  gabapentin (NEURONTIN) 300 MG capsule Take 1 capsule (300 mg total) by mouth 3 (three) times daily as needed. 08/28/22   Billie Ruddy, MD  lidocaine (LIDODERM) 5 % Place 1 patch onto the skin daily. Remove & Discard patch within 12 hours or as directed by MD 11/30/22   Eilish Mcdaniel A, PA-C  traMADol (ULTRAM) 50 MG tablet Take 50-100 mg by mouth twice daily as needed. 11/28/22   Billie Ruddy, MD      Allergies    Acetaminophen, Aspirin, and Nsaids    Review of Systems   Review of Systems  Physical Exam Updated Vital Signs BP 135/86   Pulse (!) 107   Temp 99 F (37.2 C) (Oral)   Resp 18   Ht 5' 5.5" (1.664 m)   Wt 74.8 kg   SpO2 95%   BMI 27.04 kg/m  Physical Exam Vitals and nursing note reviewed.  Constitutional:      Appearance: Normal appearance.  HENT:     Head: Normocephalic and atraumatic.  Eyes:     General: No scleral icterus.    Conjunctiva/sclera: Conjunctivae normal.  Pulmonary:     Effort: Pulmonary effort is normal. No respiratory distress.  Musculoskeletal:     Comments: Reproducible pain over the right buttocks.  No midline tenderness.  Skin:    Findings: No rash.  Neurological:     Mental Status: She is alert.  Psychiatric:        Mood and Affect: Mood normal.     ED Results / Procedures / Treatments   Labs (all labs ordered are listed, but only abnormal results are displayed) Labs Reviewed - No data to display  EKG None  Radiology No results found.  Procedures Procedures    Medications Ordered in ED Medications  methylPREDNISolone sodium succinate (SOLU-MEDROL) 125 mg/2 mL injection 125  mg (has no administration in time range)  ketorolac (TORADOL) 15 MG/ML injection 15 mg (has no administration in time range)    ED Course/ Medical Decision Making/ A&P                             Medical Decision Making Risk OTC drugs. Prescription drug management.   52 year old female presenting today with tenderness to the right buttocks.  Has been going on for the past couple of days.  Saw PCP who gave her 5 mg of Flexeril and tramadol and she continues to have pain.  We discussed that she does not have any red flags and this is not something that needs further workup from the emergency department standpoint.  She is requesting a steroid shot.  Will give her Solu-Medrol injection as well as a Toradol injection and  discharged her home with orthopedic referral.  She is agreeable to this plan.  Final Clinical Impression(s) / ED Diagnoses Final diagnoses:  Gluteal pain    Rx / DC Orders ED Discharge Orders          Ordered    cyclobenzaprine (FLEXERIL) 10 MG tablet  Daily at bedtime        11/30/22 1212    ibuprofen (ADVIL) 600 MG tablet  Every 6 hours PRN        11/30/22 1212    acetaminophen (TYLENOL) 500 MG tablet  Every 6 hours PRN        11/30/22 1212    lidocaine (LIDODERM) 5 %  Every 24 hours        11/30/22 1212           Results and diagnoses were explained to the patient. Return precautions discussed in full. Patient had no additional questions and expressed complete understanding.   This chart was dictated using voice recognition software.  Despite best efforts to proofread,  errors can occur which can change the documentation meaning.    Darliss Ridgel 11/30/22 1217    Sherwood Gambler, MD 12/03/22 1754

## 2022-11-30 NOTE — Discharge Instructions (Addendum)
As we discussed, you are dealing with some muscular pain.  This is something that needs to be treated with over-the-counter medications such as Tylenol.  You may use ibuprofen as well, this does not affect your liver.  You may use a Flexeril dose of 10 mg instead.  I will send this to your pharmacy.  Lastly, I have sent lidocaine patches to your pharmacy.  These may be worn for 12 hours at a time.  You must have 12 hours patch free.  You may follow-up with a sports medicine doctor for further evaluation.  It was a pleasure to meet you and we hope you feel better.

## 2022-11-30 NOTE — Telephone Encounter (Signed)
Spoke to patient and inform her that letter is ready to be pick up at the front.   Pt ask if her friend, Festus Barren, can pick up the letter. Advise it should be fine. Confirm with FD.  Inform pt, her friend can pick it up.

## 2022-12-18 ENCOUNTER — Ambulatory Visit: Payer: 59 | Admitting: Orthopaedic Surgery

## 2023-01-12 ENCOUNTER — Emergency Department (HOSPITAL_COMMUNITY): Payer: 59

## 2023-01-12 ENCOUNTER — Emergency Department (HOSPITAL_COMMUNITY)
Admission: EM | Admit: 2023-01-12 | Discharge: 2023-01-12 | Disposition: A | Discharge status: Home / Self Care | Payer: 59 | Attending: Emergency Medicine | Admitting: Emergency Medicine

## 2023-01-12 ENCOUNTER — Other Ambulatory Visit: Payer: Self-pay

## 2023-01-12 ENCOUNTER — Encounter (HOSPITAL_COMMUNITY): Payer: Self-pay

## 2023-01-12 DIAGNOSIS — M25561 Pain in right knee: Secondary | ICD-10-CM | POA: Diagnosis not present

## 2023-01-12 DIAGNOSIS — I10 Essential (primary) hypertension: Secondary | ICD-10-CM | POA: Diagnosis not present

## 2023-01-12 MED ORDER — TRAMADOL HCL 50 MG PO TABS
50.0000 mg | ORAL_TABLET | Freq: Once | ORAL | Status: AC
  Administered 2023-01-12: 50 mg via ORAL
  Filled 2023-01-12: qty 1

## 2023-01-12 MED ORDER — TRAMADOL HCL 50 MG PO TABS: 50.0000 mg | ORAL_TABLET | Freq: Four times a day (QID) | ORAL | 0 refills | Status: DC | PRN

## 2023-01-12 NOTE — Discharge Instructions (Addendum)
You were evaluated today for knee pain. Your x-rays were reassuring for no fracture or dislocation.  The knee brace is for comfort and support.  You may take it off as needed or if you find it cumbersome.  Please follow-up with orthopedics for further evaluation and management.  Please take the prescribed tramadol for pain as needed.

## 2023-01-12 NOTE — ED Provider Notes (Signed)
EMERGENCY DEPARTMENT AT [facility] Provider Note   CSN: [id number] Arrival date & time: 01/12/23  1324     History  Chief Complaint  Patient presents with   Knee Pain    Kelly Patton is a 52 y.o. female.  Patient presents to the emergency room complaining of right-sided knee pain.  Patient denies any known injury to the area but does state that she works on her feet all day.  She states that 3 days ago she felt a "knot" on the medial portion of the knee and it pain has gradually increased.  She denies any fevers, nausea, vomiting.  Past medical history significant arthritis, carpal tunnel syndrome, GERD, alcohol abuse, hypertension, depression.  Patient with listed allergies including NSAIDs, acetaminophen, aspirin due to "liver spots"  HPI     Home Medications Prior to Admission medications   Medication Sig Start Date End Date Taking? Authorizing Provider  traMADol (ULTRAM) 50 MG tablet Take 1 tablet (50 mg total) by mouth every 6 (six) hours as needed. 01/12/23  Yes Darrick Grinder, PA-C  acetaminophen (TYLENOL) 500 MG tablet Take 1 tablet (500 mg total) by mouth every 6 (six) hours as needed. 11/30/22   Redwine, Madison A, PA-C  albuterol (VENTOLIN HFA) 108 (90 Base) MCG/ACT inhaler Inhale 2 puffs into the lungs every 6 (six) hours as needed for wheezing or shortness of breath. 08/11/21   [name], MD  chlorthalidone (HYGROTON) 25 MG tablet Take 1 tablet (25 mg total) by mouth daily. 01/11/22   [name], MD  fexofenadine (ALLEGRA) 180 MG tablet Take 1 tablet (180 mg total) by mouth daily. Patient not taking: Reported on 08/29/2022 01/31/22   [name], MD  gabapentin (NEURONTIN) 300 MG capsule Take 1 capsule (300 mg total) by mouth 3 (three) times daily as needed. 08/28/22   [name], MD  ibuprofen (ADVIL) 600 MG tablet Take 1 tablet (600 mg total) by mouth every 6 (six) hours as needed. 11/30/22   Redwine, Madison A, PA-C   lidocaine (LIDODERM) 5 % Place 1 patch onto the skin daily. Remove & Discard patch within 12 hours or as directed by MD 11/30/22   Redwine, Madison A, PA-C      Allergies    Acetaminophen, Aspirin, and Nsaids    Review of Systems   Review of Systems  Physical Exam Updated Vital Signs BP 134/88   Pulse (!) 115   Temp 98.9 F (37.2 C) (Oral)   Resp 16   Ht 5' 2.5" (1.588 m)   Wt 77.1 kg   SpO2 96%   BMI 30.60 kg/m  Physical Exam Vitals and nursing note reviewed.  HENT:     Head: Normocephalic and atraumatic.  Eyes:     Pupils: Pupils are equal, round, and reactive to light.  Pulmonary:     Effort: Pulmonary effort is normal. No respiratory distress.  Musculoskeletal:        General: Tenderness present. No swelling, deformity or signs of injury. Normal range of motion.     Cervical back: Normal range of motion.     Comments: Tenderness with palpation to the anteromedial portion of the right knee.  No effusion or swelling noted.  Normal range of motion.  No warmth or erythema appreciated.  Skin:    General: Skin is dry.  Neurological:     Mental Status: She is alert.  Psychiatric:        Speech: Speech normal.  Behavior: Behavior normal.     ED Results / Procedures / Treatments   Labs (all labs ordered are listed, but only abnormal results are displayed) Labs Reviewed - No data to display  EKG None  Radiology DG Knee 2 Views Right  Result Date: 01/12/2023 CLINICAL DATA:  Medial right knee pain EXAM: RIGHT KNEE - 1-2 VIEW COMPARISON:  04/12/2020 FINDINGS: Frontal and lateral views of the right knee demonstrate no acute displaced fracture. Mild medial compartmental joint space narrowing. No joint effusion. Soft tissues are unremarkable. IMPRESSION: 1. Mild medial compartmental osteoarthritis. No acute bony abnormality. Electronically Signed   By: Sharlet Salina M.D.   On: 01/12/2023 14:44    Procedures .Ortho Injury Treatment  Date/Time: 01/12/2023 2:40  PM  Performed by: Darrick Grinder, PA-C Authorized by: Darrick Grinder, PA-C   Consent:    Consent obtained:  Verbal   Consent given by:  Patient   Risks discussed:  Vascular damage, stiffness, restricted joint movement and nerve damage   Alternatives discussed:  No treatmentInjury location: knee Location details: right knee Injury type: soft tissue Pre-procedure neurovascular assessment: neurovascularly intact Immobilization: brace Splint Applied by: ED Tech Post-procedure neurovascular assessment: post-procedure neurovascularly intact       Medications Ordered in ED Medications  traMADol (ULTRAM) tablet 50 mg (50 mg Oral Given 01/12/23 1448)    ED Course/ Medical Decision Making/ A&P                             Medical Decision Making Amount and/or Complexity of Data Reviewed Radiology: ordered.  Risk Prescription drug management.   Patient presents to the emergency department with a chief complaint of right-sided knee pain.  Differential diagnosis includes but is not limited to fracture, dislocation, arthritis, soft tissue injury, ligamentous injury, and others  I reviewed the patient's past medical history including orthopedic notes from Glee Arvin, MD who cared for the patient's trigger finger in the past  I ordered and interpreted imaging including plain films of the right knee.  I see no fracture or dislocation.  I agree with the radiologist findings.  I ordered the patient a knee brace.  No acute fracture or dislocation.  Patient's history and physical do not suggest ligamentous injury at this time.  Based on where the patient is tender doubt meniscal injury as well.  No warmth, large effusion, swelling, fevers to suggest septic joint.  Plan to have patient follow-up as an outpatient with orthopedics for further evaluation and management. Short course of tramadol prescribed.         Final Clinical Impression(s) / ED Diagnoses Final diagnoses:  Acute  pain of right knee    Rx / DC Orders ED Discharge Orders          Ordered    traMADol (ULTRAM) 50 MG tablet  Every 6 hours PRN        01/12/23 1447              Pamala Duffel 01/12/23 1450    Rondel Baton, MD 01/13/23 1001

## 2023-01-12 NOTE — Progress Notes (Signed)
Orthopedic Tech Progress Note Patient Details:  Kelly Patton 1971-03-25 [id number]  Ortho Devices Type of Ortho Device: Knee Sleeve Ortho Device/Splint Location: RLE Ortho Device/Splint Interventions: Application   Post Interventions Patient Tolerated: Well  [name] 01/12/2023, 3:17 PM

## 2023-01-12 NOTE — ED Triage Notes (Signed)
Pt arrived POV from home c/o right knee pain. Pt states she noticed a knot there 3 days ago and the pain has gradually gotten worse.

## 2023-01-22 ENCOUNTER — Ambulatory Visit (INDEPENDENT_AMBULATORY_CARE_PROVIDER_SITE_OTHER): Payer: 59 | Admitting: Orthopaedic Surgery

## 2023-01-22 DIAGNOSIS — M1711 Unilateral primary osteoarthritis, right knee: Secondary | ICD-10-CM

## 2023-01-22 MED ORDER — METHYLPREDNISOLONE ACETATE 40 MG/ML IJ SUSP
40.0000 mg | INTRAMUSCULAR | Status: AC | PRN
  Administered 2023-01-22: 40 mg via INTRA_ARTICULAR

## 2023-01-22 MED ORDER — LIDOCAINE HCL 1 % IJ SOLN
2.0000 mL | INTRAMUSCULAR | Status: AC | PRN
  Administered 2023-01-22: 2 mL

## 2023-01-22 MED ORDER — BUPIVACAINE HCL 0.5 % IJ SOLN
2.0000 mL | INTRAMUSCULAR | Status: AC | PRN
  Administered 2023-01-22: 2 mL via INTRA_ARTICULAR
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
## 2023-01-25 ENCOUNTER — Telehealth: Payer: Self-pay | Admitting: Orthopaedic Surgery

## 2023-01-25 NOTE — Telephone Encounter (Signed)
Pt called and made another appt for this Tuesday. Pt states received injection in knee this week and it is not helping at all and asking for pain medication. Please send to [facility] on [address]. Pt phone number is [phone].

## 2023-01-28 ENCOUNTER — Other Ambulatory Visit: Payer: Self-pay | Admitting: Physician Assistant

## 2023-01-28 MED ORDER — TRAMADOL HCL 50 MG PO TABS: 50.0000 mg | ORAL_TABLET | Freq: Two times a day (BID) | ORAL | 0 refills | Status: DC | PRN

## 2023-01-28 NOTE — Telephone Encounter (Signed)
I have sent in tramadol. Please let her know that the injection can take two weeks to become effective

## 2023-01-28 NOTE — Telephone Encounter (Signed)
Called and notified patient.

## 2023-01-29 ENCOUNTER — Ambulatory Visit: Payer: 59 | Admitting: Orthopaedic Surgery
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
## 2023-02-05 ENCOUNTER — Ambulatory Visit: Payer: 59 | Admitting: Orthopaedic Surgery

## 2023-02-05 DIAGNOSIS — M1711 Unilateral primary osteoarthritis, right knee: Secondary | ICD-10-CM

## 2023-02-07 ENCOUNTER — Telehealth: Payer: Self-pay | Admitting: Orthopaedic Surgery

## 2023-02-07 NOTE — Telephone Encounter (Signed)
Patient called advised the Rx (Tramadol) need prior auth before the pharmacy will fill the Rx. The number to contact patient is [phone]

## 2023-02-07 NOTE — Telephone Encounter (Signed)
Notified patient that medication has been approved.

## 2023-02-13 ENCOUNTER — Ambulatory Visit: Payer: 59 | Admitting: Orthopaedic Surgery

## 2023-02-27 ENCOUNTER — Ambulatory Visit (INDEPENDENT_AMBULATORY_CARE_PROVIDER_SITE_OTHER): Payer: 59 | Admitting: Family Medicine

## 2023-02-27 ENCOUNTER — Encounter: Payer: Self-pay | Admitting: Family Medicine

## 2023-02-27 VITALS — BP 128/100 | HR 100 | Temp 98.3°F | Wt 174.6 lb

## 2023-02-27 DIAGNOSIS — N951 Menopausal and female climacteric states: Secondary | ICD-10-CM

## 2023-02-27 DIAGNOSIS — I10 Essential (primary) hypertension: Secondary | ICD-10-CM

## 2023-02-27 MED ORDER — OLMESARTAN MEDOXOMIL 5 MG PO TABS: 5.0000 mg | ORAL_TABLET | Freq: Every day | ORAL | 3 refills | Status: DC
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
## 2023-03-01 ENCOUNTER — Ambulatory Visit
Admission: EM | Admit: 2023-03-01 | Discharge: 2023-03-01 | Disposition: A | Discharge status: Home / Self Care | Payer: 59 | Attending: Urgent Care | Admitting: Urgent Care

## 2023-03-01 DIAGNOSIS — R5381 Other malaise: Secondary | ICD-10-CM | POA: Diagnosis not present

## 2023-03-01 DIAGNOSIS — F1290 Cannabis use, unspecified, uncomplicated: Secondary | ICD-10-CM

## 2023-03-01 DIAGNOSIS — H9319 Tinnitus, unspecified ear: Secondary | ICD-10-CM | POA: Diagnosis not present

## 2023-03-01 DIAGNOSIS — F1010 Alcohol abuse, uncomplicated: Secondary | ICD-10-CM | POA: Diagnosis not present

## 2023-03-01 DIAGNOSIS — R053 Chronic cough: Secondary | ICD-10-CM | POA: Diagnosis not present

## 2023-03-01 DIAGNOSIS — H6993 Unspecified Eustachian tube disorder, bilateral: Secondary | ICD-10-CM | POA: Diagnosis not present

## 2023-03-01 MED ORDER — FLUTICASONE PROPIONATE 50 MCG/ACT NA SUSP: 2.0000 | Freq: Every day | NASAL | 0 refills | Status: DC

## 2023-03-01 MED ORDER — CETIRIZINE HCL 10 MG PO TABS: 10.0000 mg | ORAL_TABLET | Freq: Every day | ORAL | 0 refills | Status: DC

## 2023-03-01 MED ORDER — ESOMEPRAZOLE MAGNESIUM 20 MG PO CPDR: 20.0000 mg | DELAYED_RELEASE_CAPSULE | Freq: Every day | ORAL | 0 refills | Status: DC

## 2023-03-01 MED ORDER — FAMOTIDINE 20 MG PO TABS: 20.0000 mg | ORAL_TABLET | Freq: Two times a day (BID) | ORAL | 0 refills | Status: DC
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
## 2023-03-01 NOTE — ED Triage Notes (Signed)
Pt states "I just don't feel good" x 2 days-states she has no other way to explain-denies pain-states her PCP added new BP med 6/19-pt states not feeling well started after new med-NAD-steady gait

## 2023-03-11 ENCOUNTER — Other Ambulatory Visit (INDEPENDENT_AMBULATORY_CARE_PROVIDER_SITE_OTHER): Payer: 59

## 2023-03-11 ENCOUNTER — Encounter: Payer: Self-pay | Admitting: Gastroenterology

## 2023-03-11 ENCOUNTER — Telehealth: Payer: Self-pay | Admitting: Family Medicine

## 2023-03-11 ENCOUNTER — Ambulatory Visit (INDEPENDENT_AMBULATORY_CARE_PROVIDER_SITE_OTHER): Payer: 59 | Admitting: Gastroenterology

## 2023-03-11 VITALS — BP 118/86 | HR 101 | Ht 62.5 in | Wt 172.2 lb

## 2023-03-11 DIAGNOSIS — Z1211 Encounter for screening for malignant neoplasm of colon: Secondary | ICD-10-CM

## 2023-03-11 DIAGNOSIS — K769 Liver disease, unspecified: Secondary | ICD-10-CM

## 2023-03-11 DIAGNOSIS — Z791 Long term (current) use of non-steroidal anti-inflammatories (NSAID): Secondary | ICD-10-CM

## 2023-03-11 DIAGNOSIS — N951 Menopausal and female climacteric states: Secondary | ICD-10-CM

## 2023-03-11 DIAGNOSIS — R5383 Other fatigue: Secondary | ICD-10-CM

## 2023-03-11 DIAGNOSIS — K219 Gastro-esophageal reflux disease without esophagitis: Secondary | ICD-10-CM

## 2023-03-11 LAB — COMPREHENSIVE METABOLIC PANEL
ALT: 22 U/L (ref 0–35)
AST: 15 U/L (ref 0–37)
Albumin: 4.6 g/dL (ref 3.5–5.2)
Alkaline Phosphatase: 71 U/L (ref 39–117)
BUN: 11 mg/dL (ref 6–23)
CO2: 30 mEq/L (ref 19–32)
Calcium: 10.8 mg/dL — ABNORMAL HIGH (ref 8.4–10.5)
Chloride: 99 mEq/L (ref 96–112)
Creatinine, Ser: 0.58 mg/dL (ref 0.40–1.20)
GFR: 103.83 mL/min (ref >60.00)
Glucose, Bld: 87 mg/dL (ref 70–99)
Potassium: 3.9 mEq/L (ref 3.5–5.1)
Sodium: 137 mEq/L (ref 135–145)
Total Bilirubin: 0.5 mg/dL (ref 0.2–1.2)
Total Protein: 8 g/dL (ref 6.0–8.3)

## 2023-03-11 LAB — CBC WITH DIFFERENTIAL/PLATELET
Basophils Absolute: 0 10*3/uL (ref 0.0–0.1)
Basophils Relative: 0.4 % (ref 0.0–3.0)
Eosinophils Absolute: 0 10*3/uL (ref 0.0–0.7)
Eosinophils Relative: 0.9 % (ref 0.0–5.0)
HCT: 40.8 % (ref 36.0–46.0)
Hemoglobin: 13.6 g/dL (ref 12.0–15.0)
Lymphocytes Relative: 40.5 % (ref 12.0–46.0)
Lymphs Abs: 2.3 10*3/uL (ref 0.7–4.0)
MCHC: 33.4 g/dL (ref 30.0–36.0)
MCV: 89.4 fl (ref 78.0–100.0)
Monocytes Absolute: 0.5 10*3/uL (ref 0.1–1.0)
Monocytes Relative: 8.8 % (ref 3.0–12.0)
Neutro Abs: 2.8 10*3/uL (ref 1.4–7.7)
Neutrophils Relative %: 49.4 % (ref 43.0–77.0)
Platelets: 315 10*3/uL (ref 150.0–400.0)
RBC: 4.56 Mil/uL (ref 3.87–5.11)
RDW: 12.8 % (ref 11.5–15.5)
WBC: 5.7 10*3/uL (ref 4.0–10.5)

## 2023-03-11 MED ORDER — GABAPENTIN 300 MG PO CAPS: 300.0000 mg | ORAL_CAPSULE | Freq: Three times a day (TID) | ORAL | 1 refills | Status: DC | PRN

## 2023-03-11 MED ORDER — NA SULFATE-K SULFATE-MG SULF 17.5-3.13-1.6 GM/177ML PO SOLN: 1.0000 | Freq: Once | ORAL | 0 refills | Status: AC
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
## 2023-03-11 NOTE — Progress Notes (Signed)
Agree with assessment and plan as outlined.  

## 2023-03-11 NOTE — Telephone Encounter (Signed)
Prescription Request  03/11/2023  LOV: 02/27/2023  What is the name of the medication or equipment? gabapentin gabapentin (NEURONTIN) 300 MG capsule  Have you contacted your pharmacy to request a refill? No   Which pharmacy would you like this sent to?  [facility] [address] [place] - [address] [place] [address] Phone: [phone] Fax: [phone]    Patient notified that their request is being sent to the clinical staff for review and that they should receive a response within 2 business days.   Please advise at Mobile [phone] (mobile)

## 2023-03-11 NOTE — Patient Instructions (Addendum)
You will be contacted by [facility] Scheduling in the next 2 days to arrange a MRI Abdomen/Pelvis.  The number on your caller ID will be [phone], please answer when they call.  If you have not heard from them in 2 days please call [phone] to schedule.     Your provider has requested that you go to the basement level for lab work before leaving today. Press "B" on the elevator. The lab is located at the first door on the left as you exit the elevator.   Due to recent changes in healthcare laws, you may see the results of your imaging and laboratory studies on MyChart before your provider has had a chance to review them.  We understand that in some cases there may be results that are confusing or concerning to you. Not all laboratory results come back in the same time frame and the provider may be waiting for multiple results in order to interpret others.  Please give [place] 48 hours in order for your provider to thoroughly review all the results before contacting the office for clarification of your results.    You have been scheduled for an endoscopy and colonoscopy. Please follow the written instructions given to you at your visit today.  Please pick up your prep supplies at the pharmacy within the next 1-3 days.  If you use inhalers (even only as needed), please bring them with you on the day of your procedure.  DO NOT TAKE 7 DAYS PRIOR TO TEST- Trulicity (dulaglutide) Ozempic, Wegovy (semaglutide) Mounjaro (tirzepatide) Bydureon Bcise (exanatide extended release)  DO NOT TAKE 1 DAY PRIOR TO YOUR TEST Rybelsus (semaglutide) Adlyxin (lixisenatide) Victoza (liraglutide) Byetta (exanatide) ___________________________________________________________________________   Use Fiber Daily  Due to recent changes in healthcare laws, you may see the results of your imaging and laboratory studies on MyChart before your provider has had a chance to review them.  We understand that in  some cases there may be results that are confusing or concerning to you. Not all laboratory results come back in the same time frame and the provider may be waiting for multiple results in order to interpret others.  Please give [place] 48 hours in order for your provider to thoroughly review all the results before contacting the office for clarification of your results.    I appreciate the  opportunity to care for you  Thank You   [facility]

## 2023-04-18 ENCOUNTER — Encounter: Payer: Self-pay | Admitting: Gastroenterology

## 2023-04-27 ENCOUNTER — Encounter: Payer: Self-pay | Admitting: Certified Registered Nurse Anesthetist

## 2023-04-29 ENCOUNTER — Ambulatory Visit (AMBULATORY_SURGERY_CENTER): Payer: 59 | Admitting: Gastroenterology

## 2023-04-29 ENCOUNTER — Encounter: Payer: Self-pay | Admitting: Gastroenterology

## 2023-04-29 VITALS — BP 157/111 | HR 80 | Temp 98.0°F | Resp 19 | Ht 62.0 in | Wt 172.0 lb

## 2023-04-29 DIAGNOSIS — D123 Benign neoplasm of transverse colon: Secondary | ICD-10-CM

## 2023-04-29 DIAGNOSIS — K449 Diaphragmatic hernia without obstruction or gangrene: Secondary | ICD-10-CM | POA: Diagnosis not present

## 2023-04-29 DIAGNOSIS — K219 Gastro-esophageal reflux disease without esophagitis: Secondary | ICD-10-CM | POA: Diagnosis not present

## 2023-04-29 DIAGNOSIS — D122 Benign neoplasm of ascending colon: Secondary | ICD-10-CM

## 2023-04-29 DIAGNOSIS — D120 Benign neoplasm of cecum: Secondary | ICD-10-CM

## 2023-04-29 DIAGNOSIS — I10 Essential (primary) hypertension: Secondary | ICD-10-CM | POA: Diagnosis not present

## 2023-04-29 DIAGNOSIS — K3189 Other diseases of stomach and duodenum: Secondary | ICD-10-CM

## 2023-04-29 DIAGNOSIS — Z1211 Encounter for screening for malignant neoplasm of colon: Secondary | ICD-10-CM | POA: Diagnosis not present

## 2023-04-29 HISTORY — PX: COLONOSCOPY WITH ESOPHAGOGASTRODUODENOSCOPY (EGD): SHX5779

## 2023-04-29 MED ORDER — SODIUM CHLORIDE 0.9 % IV SOLN: 500.0000 mL | Freq: Once | INTRAVENOUS | Status: DC

## 2023-04-29 NOTE — Progress Notes (Signed)
Report given to PACU, vss 

## 2023-04-29 NOTE — Patient Instructions (Signed)
Handouts provided about hemorrhoids, diverticulosis, Hiatal hernia and polyps.    Resume previous diet.  Continue present medications.  Use Nexium daily if reflux symptoms are bothersome.  Await pathology results.   YOU HAD AN ENDOSCOPIC PROCEDURE TODAY AT THE [facility]:   Refer to the procedure report that was given to you for any specific questions about what was found during the examination.  If the procedure report does not answer your questions, please call your gastroenterologist to clarify.  If you requested that your care partner not be given the details of your procedure findings, then the procedure report has been included in a sealed envelope for you to review at your convenience later.  YOU SHOULD EXPECT: Some feelings of bloating in the abdomen. Passage of more gas than usual.  Walking can help get rid of the air that was put into your GI tract during the procedure and reduce the bloating. If you had a lower endoscopy (such as a colonoscopy or flexible sigmoidoscopy) you may notice spotting of blood in your stool or on the toilet paper. If you underwent a bowel prep for your procedure, you may not have a normal bowel movement for a few days.  Please Note:  You might notice some irritation and congestion in your nose or some drainage.  This is from the oxygen used during your procedure.  There is no need for concern and it should clear up in a day or so.  SYMPTOMS TO REPORT IMMEDIATELY:  Following lower endoscopy (colonoscopy or flexible sigmoidoscopy):  Excessive amounts of blood in the stool  Significant tenderness or worsening of abdominal pains  Swelling of the abdomen that is new, acute  Fever of 100F or higher  Following upper endoscopy (EGD)  Vomiting of blood or coffee ground material  New chest pain or pain under the shoulder blades  Painful or persistently difficult swallowing  New shortness of breath  Fever of 100F or higher  Black, tarry-looking  stools  For urgent or emergent issues, a gastroenterologist can be reached at any hour by calling [phone]. Do not use MyChart messaging for urgent concerns.    DIET:  We do recommend a small meal at first, but then you may proceed to your regular diet.  Drink plenty of fluids but you should avoid alcoholic beverages for 24 hours.  ACTIVITY:  You should plan to take it easy for the rest of today and you should NOT DRIVE or use heavy machinery until tomorrow (because of the sedation medicines used during the test).    FOLLOW UP: Our staff will call the number listed on your records the next business day following your procedure.  We will call around 7:15- 8:00 am to check on you and address any questions or concerns that you may have regarding the information given to you following your procedure. If we do not reach you, we will leave a message.     If any biopsies were taken you will be contacted by phone or by letter within the next 1-3 weeks.  Please call us at [phone] if you have not heard about the biopsies in 3 weeks.    SIGNATURES/CONFIDENTIALITY: You and/or your care partner have signed paperwork which will be entered into your electronic medical record.  These signatures attest to the fact that that the information above on your After Visit Summary has been reviewed and is understood.  Full responsibility of the confidentiality of this discharge information lies with you  and/or your care-partner.

## 2023-04-29 NOTE — Progress Notes (Signed)
Called to room to assist during endoscopic procedure.  Patient ID and intended procedure confirmed with present staff. Received instructions for my participation in the procedure from the performing physician.  

## 2023-04-29 NOTE — Progress Notes (Signed)
VS completed by DT.  Pt's states no medical or surgical changes since previsit or office visit.  

## 2023-04-29 NOTE — Progress Notes (Signed)
1423 HR > 100 with esmolol 25 mg given IV, MD updated, vss  ?

## 2023-04-29 NOTE — Op Note (Signed)
[facility] Patient Name: Kelly Patton Procedure Date: 04/29/2023 1:58 PM MRN: [id number] Endoscopist: Viviann Spare P. Adela Lank , MD, [id number] Age: 52 Referring MD:  Date of Birth: [birth year] Gender: Female Account #: [id number] Procedure:                Colonoscopy Indications:              Screening for colorectal malignant neoplasm, This                            is the patient's first colonoscopy Medicines:                Monitored Anesthesia Care Procedure:                Pre-Anesthesia Assessment:                           - Prior to the procedure, a History and Physical                            was performed, and patient medications and                            allergies were reviewed. The patient's tolerance of                            previous anesthesia was also reviewed. The risks                            and benefits of the procedure and the sedation                            options and risks were discussed with the patient.                            All questions were answered, and informed consent                            was obtained. Prior Anticoagulants: The patient has                            taken no anticoagulant or antiplatelet agents. ASA                            Grade Assessment: II - A patient with mild systemic                            disease. After reviewing the risks and benefits,                            the patient was deemed in satisfactory condition to                            undergo the procedure.  After obtaining informed consent, the colonoscope                            was passed under direct vision. Throughout the                            procedure, the patient's blood pressure, pulse, and                            oxygen saturations were monitored continuously. The                            [id number]-HQ190L Colonoscope [id number] was introduced                            through the anus  and advanced to the the cecum,                            identified by appendiceal orifice and ileocecal                            valve. The colonoscopy was performed without                            difficulty. The patient tolerated the procedure                            well. The quality of the bowel preparation was                            adequate. The ileocecal valve, appendiceal orifice,                            and rectum were photographed. Scope In: 2:30:41 PM Scope Out: 2:51:53 PM Scope Withdrawal Time: 0 hours 19 minutes 40 seconds  Total Procedure Duration: 0 hours 21 minutes 12 seconds  Findings:                 Hemorrhoids were found on perianal exam.                           A 4 mm polyp was found in the cecum. The polyp was                            sessile. The polyp was removed with a cold snare.                            Resection and retrieval were complete.                           A 4 mm polyp was found in the ascending colon. The                            polyp was sessile. The polyp  was removed with a                            cold snare. Resection and retrieval were complete.                           Two sessile polyps were found in the hepatic                            flexure. The polyps were 2 to 3 mm in size. These                            polyps were removed with a cold snare. Resection                            and retrieval were complete.                           Two sessile polyps were found in the transverse                            colon. The polyps were 3 to 4 mm in size. These                            polyps were removed with a cold snare. Resection                            and retrieval were complete.                           Scattered small-mouthed diverticula were found in                            the entire colon.                           Internal hemorrhoids were found during retroflexion.                            The exam was otherwise without abnormality. Prep                            was adequate but several minutes spent lavaging the                            colon to removal residual liquid stool. Complications:            No immediate complications. Estimated blood loss:                            Minimal. Estimated Blood Loss:     Estimated blood loss was minimal. Impression:               - Hemorrhoids found on perianal exam.                           -  One 4 mm polyp in the cecum, removed with a cold                            snare. Resected and retrieved.                           - One 4 mm polyp in the ascending colon, removed                            with a cold snare. Resected and retrieved.                           - Two 2 to 3 mm polyps at the hepatic flexure,                            removed with a cold snare. Resected and retrieved.                           - Two 3 to 4 mm polyps in the transverse colon,                            removed with a cold snare. Resected and retrieved.                           - Diverticulosis in the entire examined colon.                           - Internal hemorrhoids.                           - The examination was otherwise normal. Recommendation:           - Patient has a contact number available for                            emergencies. The signs and symptoms of potential                            delayed complications were discussed with the                            patient. Return to normal activities tomorrow.                            Written discharge instructions were provided to the                            patient.                           - Resume previous diet.                           - Continue present medications.                           -  Await pathology results. Viviann Spare P. Costantino Kohlbeck, MD 04/29/2023 3:03:38 PM This report has been signed electronically.

## 2023-04-29 NOTE — Progress Notes (Signed)
1420  Pt experienced laryngeal spasm with jaw thrust and PPV performed. vss
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
## 2023-04-29 NOTE — Progress Notes (Signed)
1415 Robinul 0.1 mg IV given due large amount of secretions upon assessment.  MD made aware, vss  ?

## 2023-04-29 NOTE — Op Note (Signed)
[name] Patient Name: Kelly Patton Procedure Date: 04/29/2023 2:05 PM MRN: [id number] Endoscopist: Viviann Spare P. Adela Lank , MD, [id number] Age: 52 Referring MD:  Date of Birth: 1971-05-28 Gender: Female Account #: [id number] Procedure:                Upper GI endoscopy Indications:              history of gastro-esophageal reflux disease -                            history of nexium use, not using routinely, having                            intermittent symptoms, history of NSAID use Medicines:                Monitored Anesthesia Care Procedure:                Pre-Anesthesia Assessment:                           - Prior to the procedure, a History and Physical                            was performed, and patient medications and                            allergies were reviewed. The patient's tolerance of                            previous anesthesia was also reviewed. The risks                            and benefits of the procedure and the sedation                            options and risks were discussed with the patient.                            All questions were answered, and informed consent                            was obtained. Prior Anticoagulants: The patient has                            taken no anticoagulant or antiplatelet agents. ASA                            Grade Assessment: II - A patient with mild systemic                            disease. After reviewing the risks and benefits,                            the patient was deemed in satisfactory condition to  undergo the procedure.                           After obtaining informed consent, the endoscope was                            passed under direct vision. Throughout the                            procedure, the patient's blood pressure, pulse, and                            oxygen saturations were monitored continuously. The                             GIF HQ190 #[id number] was introduced through the                            mouth, and advanced to the second part of duodenum.                            The upper GI endoscopy was accomplished without                            difficulty. The patient tolerated the procedure                            well. Scope In: Scope Out: Findings:                 Esophagogastric landmarks were identified: the                            Z-line was found at 36 cm, the gastroesophageal                            junction was found at 36 cm and the upper extent of                            the gastric folds was found at 37 cm from the                            incisors.                           A 1 cm hiatal hernia was present.                           The exam of the esophagus was otherwise normal. No                            Barrett's esophagus.                           Patchy erythematous mucosa was found in the gastric  antrum.                           The exam of the stomach was otherwise normal.                           Biopsies were taken with a cold forceps for                            Helicobacter pylori testing.                           The examined duodenum was normal. Complications:            No immediate complications. Estimated blood loss:                            Minimal. Estimated Blood Loss:     Estimated blood loss was minimal. Impression:               - Esophagogastric landmarks identified.                           - 1 cm hiatal hernia.                           - Normal esophagus otherwise - no Barrett's                           - Erythematous mucosa in the antrum.                           - Normal stomach otherwise - biopsies taken to rule                            out H pylori, history of NSAID use.                           - Normal examined duodenum. Recommendation:           - Patient has a contact number available for                             emergencies. The signs and symptoms of potential                            delayed complications were discussed with the                            patient. Return to normal activities tomorrow.                            Written discharge instructions were provided to the                            patient.                           -  Resume previous diet.                           - Continue present medications.                           - Use nexium daily if reflux symptoms are                            bothersome.                           - Await pathology results. Viviann Spare P. Lovie Agresta, MD 04/29/2023 2:58:50 PM This report has been signed electronically.

## 2023-04-30 ENCOUNTER — Telehealth: Payer: Self-pay | Admitting: *Deleted

## 2023-04-30 NOTE — Telephone Encounter (Signed)
  Follow up Call-     04/29/2023    1:12 PM  Call back number  Post procedure Call Back phone  # [phone]  Permission to leave phone message Yes     Patient questions:  Do you have a fever, pain , or abdominal swelling? No. Pain Score  0 *  Have you tolerated food without any problems? Yes.    Have you been able to return to your normal activities? Yes.    Do you have any questions about your discharge instructions: Diet   No. Medications  No. Follow up visit  No.  Do you have questions or concerns about your Care? No.  Actions: * If pain score is 4 or above: No action needed, pain <4.

## 2023-05-04 ENCOUNTER — Telehealth: Payer: Self-pay | Admitting: Gastroenterology

## 2023-05-04 NOTE — Telephone Encounter (Signed)
Kelly Patton,  This patient called through the answering service this morning reporting the passage of bright red blood per rectum.  She had an episode of passing fresh blood with clots with a normal bowel movement.  She is not previously experienced rectal bleeding.  On 04/29/2023, she had EGD and colonoscopy with Dr. Adela Lank.  Gastric biopsies taken.  6 subcentimeter polyps in various colon locations removed with cold snare, internal hemorrhoids seen.  Uncomplicated procedure.  She reports having regular bowel movements without blood since the colonoscopy until the episode this morning.  She denies abdominal pain, nausea, vomiting, chest pain, dyspnea or dizziness.  [name] also reports that she is no longer using Goody powders or other aspirin containing products, and she is not on any other antiplatelet agents or anticoagulants.  I told her the bleeding may be from one or more polyp sites, despite them having been small and removed with cold snare, or could be hemorrhoidal bleeding (though she has not experienced that in the past). I advised her to monitor this for the next few hours and see what her next bowel movement looks like.  If it is as much or more blood than the last episode, or if she should develop any of the above symptoms along with any further bleeding, then she should present to the [facility] ED immediately.    Ellwood Dense, MD  __________  Cherlynn June since you are on call until Sun eve  - HD

## 2023-05-06 NOTE — Telephone Encounter (Signed)
Called and spoke with patient this morning. Pt states that she is doing fine at this time. Pt noticed blood once more after speaking with Dr. Myrtie Neither, but bleeding has since stopped. Pt has not noticed any further bleeding or concerning symptoms. Pt will contact us if anything changes. Pt had no concerns at the end of the call.

## 2023-05-06 NOTE — Telephone Encounter (Signed)
Thanks Sherilyn Cooter for taking her call. Looks like she did not go to our EDs, hopefully this stopped without intervention.  Bro[place]lyn can you please call her to see how she is doing? Her procedure was about a week ago at this point. Hopefully no recurrence and this was self limited. Thanks

## 2023-05-06 NOTE — Telephone Encounter (Signed)
Great, thank you for calling her and the follow up

## 2023-05-09 ENCOUNTER — Encounter: Payer: Self-pay | Admitting: Gastroenterology

## 2023-06-03 ENCOUNTER — Telehealth: Payer: Self-pay | Admitting: Family Medicine

## 2023-06-03 NOTE — Telephone Encounter (Signed)
Prescription Request  06/03/2023  LOV: 02/27/2023  What is the name of the medication or equipment? olmesartan (BENICAR) 5 MG tablet  gabapentin (NEURONTIN) 300 MG capsule  Have you contacted your pharmacy to request a refill? No   Which pharmacy would you like this sent to?   [id number] - [place], [place] - [address] [place] [address] Phone: [phone] Fax: [phone]    Patient notified that their request is being sent to the clinical staff for review and that they should receive a response within 2 business days.   Please advise at Mobile [phone] (mobile)

## 2023-06-18 ENCOUNTER — Other Ambulatory Visit: Payer: Self-pay

## 2023-06-18 ENCOUNTER — Emergency Department (HOSPITAL_COMMUNITY)
Admission: EM | Admit: 2023-06-18 | Discharge: 2023-06-19 | Discharge status: Against Medical Advice | Payer: 59 | Attending: Emergency Medicine | Admitting: Emergency Medicine

## 2023-06-18 DIAGNOSIS — H9319 Tinnitus, unspecified ear: Secondary | ICD-10-CM | POA: Insufficient documentation

## 2023-06-18 DIAGNOSIS — Z5321 Procedure and treatment not carried out due to patient leaving prior to being seen by health care provider: Secondary | ICD-10-CM | POA: Diagnosis not present

## 2023-06-18 NOTE — ED Triage Notes (Signed)
Patient states that for over one year she has heard static, buzzing noise in her hair and feeling like her eyes are having trouble focusing if she isn't looking at one specific object. Denies headache.

## 2023-06-19 NOTE — ED Notes (Signed)
Called patient 3x to update vitals-no answer

## 2023-07-26 ENCOUNTER — Encounter: Payer: Self-pay | Admitting: Family Medicine

## 2023-07-26 ENCOUNTER — Ambulatory Visit (INDEPENDENT_AMBULATORY_CARE_PROVIDER_SITE_OTHER): Payer: 59 | Admitting: Family Medicine

## 2023-07-26 VITALS — BP 102/74 | HR 97 | Temp 98.5°F | Ht 62.0 in | Wt 182.4 lb

## 2023-07-26 DIAGNOSIS — L219 Seborrheic dermatitis, unspecified: Secondary | ICD-10-CM

## 2023-07-26 DIAGNOSIS — H43393 Other vitreous opacities, bilateral: Secondary | ICD-10-CM | POA: Diagnosis not present

## 2023-07-26 DIAGNOSIS — L0291 Cutaneous abscess, unspecified: Secondary | ICD-10-CM

## 2023-07-26 DIAGNOSIS — H9313 Tinnitus, bilateral: Secondary | ICD-10-CM

## 2023-07-26 DIAGNOSIS — F1010 Alcohol abuse, uncomplicated: Secondary | ICD-10-CM | POA: Diagnosis not present

## 2023-07-26 MED ORDER — CLOBETASOL PROPIONATE 0.05 % EX SOLN: 1.0000 | Freq: Two times a day (BID) | CUTANEOUS | 0 refills | Status: DC

## 2023-07-26 MED ORDER — AMOXICILLIN-POT CLAVULANATE 500-125 MG PO TABS: 1.0000 | ORAL_TABLET | Freq: Two times a day (BID) | ORAL | 0 refills | Status: AC

## 2023-07-26 NOTE — Progress Notes (Signed)
Established Patient Office Visit   Subjective  Patient ID: Kelly Patton, female    DOB: 1971-08-04  Age: 52 y.o. MRN: [id number]  Chief Complaint  Patient presents with   Breast Pain    Right breat pain, patient has a what looks like a boil, started 3 days ago, pus, pain, rate of pain 6 out of 10     Patient is a 52 year old female seen for several concerns.  Patient endorses a firm area in right breast and a pimple x 3 days.  Pimple began draining foul-smelling fluid.  Area is painful.  Patient denies fever, chills, nausea, vomiting.  Patient endorses continued sounds of "static" that increases and decreases in frequency.  Patient states the noises are making her "feel crazy".  Patient also notes continued dry flaky areas in scalp.  Patient concerned she has bugs in her scalp.  Patient continues to drink several shots of vodka each night to help her sleep.  Patient states she has cut down on alcohol intake.    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 11/21/2021   PTSD (post-traumatic stress disorder) 12/28/2020   Substance abuse (HCC) 12/16/2020   Alcohol dependence with unspecified alcohol-induced disorder (HCC) 12/16/2020   Left carpal tunnel syndrome 08/10/2020   Primary osteoarthritis of first carpometacarpal joint of left hand 08/10/2020   History of 2019 novel coronavirus disease (COVID-19) 07/19/2020   Arthritis 07/19/2020   Lumbar back pain 11/11/2019   Trigger thumb, right thumb 12/09/2017   Essential hypertension 07/17/2017   Primary insomnia 07/17/2017   Past Medical History:  Diagnosis Date   Allergies    Anxiety    Arthritis    Carpal tunnel syndrome, right    Depression    ETOH abuse    daily marijuana   GERD (gastroesophageal reflux disease)    Hypertension    Shortness of breath dyspnea    with exertion uses inhaler   Past Surgical History:  Procedure Laterality Date   CARPAL  TUNNEL RELEASE Right 01/31/2016   Procedure: CARPAL TUNNEL RELEASE;  Surgeon: Cammy Copa, MD;  Location: [facility] OR;  Service: Orthopedics;  Laterality: Right;   COLONOSCOPY     COLONOSCOPY WITH ESOPHAGOGASTRODUODENOSCOPY (EGD)  04/29/2023   [facility] at [facility]   FINGER SURGERY Right    ring finger, due to knife cut   HAND TENDON SURGERY Right    TRIGGER FINGER RELEASE Right 11/26/2017   Procedure: RELEASE TRIGGER FINGER RIGHT THUMB;  Surgeon: Cammy Copa, MD;  Location: [facility] OR;  Service: Orthopedics;  Laterality: Right;   TRIGGER FINGER RELEASE Left 04/04/2022   Procedure: LEFT TRIGGER THUMB RELEASE;  Surgeon: Tarry Kos, MD;  Location: [facility];  Service: Orthopedics;  Laterality: Left;   UPPER GASTROINTESTINAL ENDOSCOPY     Social History   Tobacco Use   Smoking status: Some Days    Types: Cigarettes   Smokeless tobacco: Never  Vaping Use   Vaping status: Never Used  Substance Use Topics   Alcohol use: Yes    Alcohol/week: 28.0 standard drinks of alcohol    Types: 28 Shots of liquor per week    Comment: Liquor 2 shots daily   Drug use: Yes    Frequency: 14.0 times per week    Types: Marijuana    Comment: last use was yesterday 04/28/23 per patient   Family History  Problem Relation Age of Onset  Hypertension Father    Hypertension Brother    Hypertension Paternal Grandmother    Hypertension Paternal Grandfather    Alcoholism Paternal Grandfather    Colon cancer Paternal Grandfather    Breast cancer Neg Hx    Rectal cancer Neg Hx    Stomach cancer Neg Hx    Esophageal cancer Neg Hx    Allergies  Allergen Reactions   Acetaminophen Other (See Comments)    Told not to take due to "liver spot"   Aspirin Other (See Comments)    Told not to take due to "liver spot"   Nsaids Other (See Comments)    Told not to take due to "liver spot"      ROS Negative unless stated above    Objective:     BP 102/74 (BP Location: Left Arm,  Patient Position: Sitting, Cuff Size: Normal)   Pulse 97   Temp 98.5 F (36.9 C) (Oral)   Ht 5\' 2"  (1.575 m)   Wt 182 lb 6.4 oz (82.7 kg)   LMP  (LMP Unknown)   SpO2 99%   BMI 33.36 kg/m  BP Readings from Last 3 Encounters:  07/26/23 102/74  06/18/23 (!) 132/102  04/29/23 (!) 157/111   Wt Readings from Last 3 Encounters:  07/26/23 182 lb 6.4 oz (82.7 kg)  04/29/23 172 lb (78 kg)  03/11/23 172 lb 4 oz (78.1 kg)      Physical Exam Constitutional:      General: She is not in acute distress.    Appearance: Normal appearance.  HENT:     Head: Normocephalic and atraumatic.     Nose: Nose normal.     Mouth/Throat:     Mouth: Mucous membranes are moist.  Cardiovascular:     Rate and Rhythm: Normal rate and regular rhythm.     Heart sounds: Normal heart sounds. No murmur heard.    No gallop.  Pulmonary:     Effort: Pulmonary effort is normal. No respiratory distress.     Breath sounds: Normal breath sounds. No wheezing, rhonchi or rales.  Skin:    General: Skin is warm and dry.     Comments: Draining abscess R medial upper breast proximal to sternum. Small area of induration lateral to abscess.  Large plaques of flaky dried skin in scalp.  Neurological:     Mental Status: She is alert and oriented to person, place, and time.      No results found for any visits on 07/26/23.    Assessment & Plan:  Abscess -Draining -Start ABX -Supportive care with warm compresses -Monitor for S/S of worsening infection. -If firm area remains in breast status post completion of antibiotics obtain diet oxidant mammogram.  Otherwise plan to schedule screening mammogram. -     Amoxicillin-Pot Clavulanate; Take 1 tablet by mouth in the morning and at bedtime for 7 days.  Dispense: 14 tablet; Refill: 0  Tinnitus of both ears -Discussed ways to drown out noise. -Given handout  Seborrheic dermatitis -     Clobetasol Propionate; Apply 1 Application topically 2 (two) times daily.   Dispense: 50 mL; Refill: 0  Vitreous floaters of both eyes -Patient encouraged to set up vision screen  Alcohol abuse -Patient advised to cut down on alcohol intake especially with antibiotic use. -Patient is not ready to quit yet. -Continue to monitor each visit.  Offer quit aids.   Return if symptoms worsen or fail to improve.   [name], MD

## 2023-07-29 ENCOUNTER — Telehealth: Payer: Self-pay | Admitting: Family Medicine

## 2023-07-29 ENCOUNTER — Other Ambulatory Visit: Payer: Self-pay | Admitting: Family Medicine

## 2023-07-29 DIAGNOSIS — L219 Seborrheic dermatitis, unspecified: Secondary | ICD-10-CM

## 2023-07-29 DIAGNOSIS — N951 Menopausal and female climacteric states: Secondary | ICD-10-CM

## 2023-07-29 MED ORDER — CLOBETASOL PROPIONATE 0.05 % EX SHAM: MEDICATED_SHAMPOO | CUTANEOUS | 0 refills | Status: DC

## 2023-07-29 NOTE — Addendum Note (Signed)
Addended by: [name] on: 07/29/2023 06:29 PM   Modules accepted: Orders

## 2023-07-29 NOTE — Telephone Encounter (Signed)
Pt call and and stated the wrong medication was send to the pharmacy pt stated the pharmacy said it was lotion but should have been shampoo and want it sent to [facility] [address] [place], [place] - [address] Phone: [phone]  Fax: [phone]

## 2023-07-29 NOTE — Telephone Encounter (Signed)
Prescription resent

## 2023-07-30 MED ORDER — GABAPENTIN 300 MG PO CAPS: 300.0000 mg | ORAL_CAPSULE | Freq: Three times a day (TID) | ORAL | 1 refills | Status: DC | PRN

## 2023-07-30 NOTE — Telephone Encounter (Signed)
Spoke with patient she is aware, patient asked for Gabapentin be also sent to the pharmacy

## 2023-10-02 ENCOUNTER — Ambulatory Visit: Payer: 59 | Admitting: Family Medicine

## 2023-10-07 ENCOUNTER — Telehealth: Payer: 59 | Admitting: Family Medicine

## 2023-10-11 ENCOUNTER — Ambulatory Visit: Payer: 59 | Admitting: Family Medicine

## 2023-10-17 ENCOUNTER — Ambulatory Visit
Admission: EM | Admit: 2023-10-17 | Discharge: 2023-10-17 | Disposition: A | Discharge status: Home / Self Care | Payer: 59 | Attending: Family Medicine | Admitting: Family Medicine

## 2023-10-17 DIAGNOSIS — J40 Bronchitis, not specified as acute or chronic: Secondary | ICD-10-CM | POA: Diagnosis not present

## 2023-10-17 DIAGNOSIS — J4530 Mild persistent asthma, uncomplicated: Secondary | ICD-10-CM | POA: Diagnosis not present

## 2023-10-17 DIAGNOSIS — R0789 Other chest pain: Secondary | ICD-10-CM | POA: Diagnosis not present

## 2023-10-17 DIAGNOSIS — Z789 Other specified health status: Secondary | ICD-10-CM

## 2023-10-17 MED ORDER — FAMOTIDINE 20 MG PO TABS: 20.0000 mg | ORAL_TABLET | Freq: Two times a day (BID) | ORAL | 0 refills | Status: DC

## 2023-10-17 MED ORDER — ALBUTEROL SULFATE HFA 108 (90 BASE) MCG/ACT IN AERS: 2.0000 | INHALATION_SPRAY | Freq: Four times a day (QID) | RESPIRATORY_TRACT | 0 refills | Status: AC | PRN

## 2023-10-17 MED ORDER — PREDNISONE 20 MG PO TABS
ORAL_TABLET | ORAL | 0 refills | Status: DC
Start: 2023-10-17 — End: 2023-11-07

## 2023-10-17 NOTE — ED Triage Notes (Signed)
 Pt c/o central CP x 2-3 weeks-worse x today/constant-NAD-steady gait

## 2023-10-17 NOTE — ED Provider Notes (Signed)
 [facility] URGENT [facility]  Note:  This document was prepared using Conservation officer, historic buildings and may include unintentional dictation errors.  MRN: [id number] DOB: 05/15/1971  Subjective:   Kelly Patton is a 53 y.o. female presenting for 2 to 3-week history of intermittent persistent mid sternal chest pain.  Has had chest tightness as well and wheezing.  Today her symptoms have been more constant.  Patient is a smoker, does a few cigarettes and smokes marijuana multiple times daily.  Drinks more than 4 shots of liquor daily and sometimes drinks out of the bottle.  No fever, hemoptysis.  No current facility-administered medications for this encounter.  Current Outpatient Medications:    albuterol  (VENTOLIN  HFA) 108 (90 Base) MCG/ACT inhaler, Inhale 2 puffs into the lungs every 6 (six) hours as needed for wheezing or shortness of breath., Disp: 8 g, Rfl: 0   Clobetasol  Propionate 0.05 % shampoo, Apply thin film to affected areas of dry scalp daily.  Leave on for 15 minutes then wash hair., Disp: 118 mL, Rfl: 0   gabapentin  (NEURONTIN ) 300 MG capsule, Take 1 capsule (300 mg total) by mouth 3 (three) times daily as needed., Disp: 90 capsule, Rfl: 1   ibuprofen  (ADVIL ) 600 MG tablet, Take 1 tablet (600 mg total) by mouth every 6 (six) hours as needed., Disp: 30 tablet, Rfl: 0   olmesartan  (BENICAR ) 5 MG tablet, Take 1 tablet (5 mg total) by mouth daily., Disp: 90 tablet, Rfl: 3   Allergies  Allergen Reactions   Acetaminophen  Other (See Comments)    Told not to take due to liver spot   Aspirin Other (See Comments)    Told not to take due to liver spot   Nsaids Other (See Comments)    Told not to take due to liver spot    Past Medical History:  Diagnosis Date   Allergies    Anxiety    Arthritis    Carpal tunnel syndrome, right    Depression    ETOH abuse    daily marijuana   GERD (gastroesophageal reflux disease)    Hypertension    Shortness of  breath dyspnea    with exertion uses inhaler     Past Surgical History:  Procedure Laterality Date   CARPAL TUNNEL RELEASE Right 01/31/2016   Procedure: CARPAL TUNNEL RELEASE;  Surgeon: Glendia Cordella Hutchinson, MD;  Location: [facility] OR;  Service: Orthopedics;  Laterality: Right;   COLONOSCOPY     COLONOSCOPY WITH ESOPHAGOGASTRODUODENOSCOPY (EGD)  04/29/2023   [facility] at [facility]   FINGER SURGERY Right    ring finger, due to knife cut   HAND TENDON SURGERY Right    TRIGGER FINGER RELEASE Right 11/26/2017   Procedure: RELEASE TRIGGER FINGER RIGHT THUMB;  Surgeon: Hutchinson Cordella Glendia, MD;  Location: [facility] OR;  Service: Orthopedics;  Laterality: Right;   TRIGGER FINGER RELEASE Left 04/04/2022   Procedure: LEFT TRIGGER THUMB RELEASE;  Surgeon: Jerri Kay HERO, MD;  Location: [facility];  Service: Orthopedics;  Laterality: Left;   UPPER GASTROINTESTINAL ENDOSCOPY      Family History  Problem Relation Age of Onset   Hypertension Father    Hypertension Brother    Hypertension Paternal Grandmother    Hypertension Paternal Grandfather    Alcoholism Paternal Grandfather    Colon cancer Paternal Grandfather    Breast cancer Neg Hx    Rectal cancer Neg Hx    Stomach cancer Neg Hx    Esophageal cancer  Neg Hx     Social History   Tobacco Use   Smoking status: Every Day    Types: Cigarettes   Smokeless tobacco: Never  Vaping Use   Vaping status: Never Used  Substance Use Topics   Alcohol  use: Not Currently    Comment: daily-3-4 shots/day   Drug use: Yes    Types: Marijuana    ROS   Objective:   Vitals: BP (!) 155/98 (BP Location: Left Arm) Comment: noncompliant HTN  Pulse 98   Temp 99.1 F (37.3 C) (Oral)   Resp 20   SpO2 97%   Physical Exam Constitutional:      General: She is not in acute distress.    Appearance: Normal appearance. She is well-developed. She is not ill-appearing, toxic-appearing or diaphoretic.  HENT:     Head: Normocephalic and atraumatic.      Nose: Nose normal.     Mouth/Throat:     Mouth: Mucous membranes are moist.     Pharynx: Oropharynx is clear.  Eyes:     General: No scleral icterus.       Right eye: No discharge.        Left eye: No discharge.     Extraocular Movements: Extraocular movements intact.     Conjunctiva/sclera: Conjunctivae normal.  Cardiovascular:     Rate and Rhythm: Normal rate and regular rhythm.     Heart sounds: Normal heart sounds. No murmur heard.    No friction rub. No gallop.  Pulmonary:     Effort: Pulmonary effort is normal. No respiratory distress.     Breath sounds: No stridor. No wheezing, rhonchi or rales.  Chest:     Chest wall: No tenderness.  Abdominal:     General: Bowel sounds are normal. There is no distension.     Palpations: Abdomen is soft. There is no mass.     Tenderness: There is no abdominal tenderness. There is no right CVA tenderness, left CVA tenderness, guarding or rebound.  Skin:    General: Skin is warm and dry.  Neurological:     General: No focal deficit present.     Mental Status: She is alert and oriented to person, place, and time.  Psychiatric:        Mood and Affect: Mood normal.        Behavior: Behavior normal.        Thought Content: Thought content normal.        Judgment: Judgment normal.    ED ECG REPORT   Date: 10/17/2023  EKG Time: 8:02 PM  Rate: 95bpm  Rhythm: normal sinus rhythm,  normal EKG, normal sinus rhythm  Axis: normal  Intervals:none  ST&T Change: T wave flattening in lead aVL  Narrative Interpretation: Sinus rhythm at 95 bpm with nonspecific T wave change in a single lead.  Comparable to previous EKGs.   Assessment and Plan :   PDMP not reviewed this encounter.  1. Atypical chest pain   2. Chest tightness   3. Mild persistent asthma without complication   4. Bronchitis   5. Alcohol  use    Offered chest x-ray but patient refused.  High suspicion that her symptoms are related to her smoking and excessive alcohol  use.   Had an extensive discussion with patient about seeking alcohol  rehab which she is not receptive to this.  In the context of her smoking and asthma recommended albuterol , prednisone .  Famotidine  for GERD, alcoholic gastritis.  Patient plans on quitting alcohol  on her  own but still encouraged her to seek help.  Counseled patient on potential for adverse effects with medications prescribed/recommended today, ER and return-to-clinic precautions discussed, patient verbalized understanding.    Christopher Savannah, PA-C 10/17/23 2004

## 2023-10-31 ENCOUNTER — Ambulatory Visit: Payer: Self-pay | Admitting: Family Medicine

## 2023-10-31 NOTE — Telephone Encounter (Signed)
Chief Complaint: Chest pain Symptoms: Chest pain Frequency: Constant Pertinent Negatives: Patient denies Radiating, arm/jaw pain/numbness, diaphoresis, SOB, n/v, Disposition: [] ED /[] Urgent Care (no appt availability in office) / [x] Appointment(In office/virtual)/ []  [facility]/ [] Home Care/ [] Refused Recommended Disposition /[] [facility]/ []  Follow-up with PCP Additional Notes: Patient called with complaints of chest pain/soreness that has lasted for about a month. Patient states that the pain is in the center of her chest, constant, and feels like "a brick is sitting right there." Patient states that the pain has not gone away since it started about a month ago and is rated a 4 or a 5 currently, better than when it was a 6-7/10 during the beginning. Patient sates that pain is worsened when stretching chest muscles but not with palpation. 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 Patient states that she is just getting over being sick where she was given a prednisone tablet that she lost. Patient also states that she sleeps with her window open at night and her room is a freezer while she begins menopause. Patient denies SOB, numbness/pain in jaw/arm, history of heart attack, or sweating. Patient advised by this RN to be seen within 3 days to which the patient refused, stating she will only be available by next Wednesday 2/26 and requesting an appt at that time. Patient advised that a note will be routed to the office for PCP review. Patient advised to call back or call 911 with worsening symptoms. Patient verbalized understanding   Copied from CRM [id number]. Topic: Clinical - Red Word Triage >> Oct 31, 2023  3:10 PM Theodis Sato wrote: Red Word that prompted transfer to Nurse Triage: Chest pain and is worried about blood clotting. Reason for Disposition  [1] Chest wall swelling or pain  AND [2] present > 7 days  Answer Assessment - Initial Assessment Questions 1. LOCATION: "Where does it hurt?"       Middle 2. RADIATION: "Does the pain go anywhere else?" (e.g., into neck, jaw, arms, back)     Denies 3. ONSET: "When did the chest pain begin?" (Minutes, hours or days)      A month ago. 4. PATTERN: "Does the pain come and go, or has it been constant since it started?"  "Does it get worse with exertion?"      Constant 5. DURATION: "How long does it last" (e.g., seconds, minutes, hours)     Constant - it hasn't stopped since it started. 6. SEVERITY: "How bad is the pain?"  (e.g., Scale 1-10; mild, moderate, or severe)    - MILD (1-3): doesn't interfere with normal activities     - MODERATE (4-7): interferes with normal activities or awakens from sleep    - SEVERE (8-10): excruciating pain, unable to do any normal activities       "Four or five now it was like a six or seven when it first startd 7. CARDIAC RISK FACTORS: "Do you have any history of heart problems or risk factors for heart disease?" (e.g., angina, prior heart attack; diabetes, high blood pressure, high cholesterol, smoker, or strong family history of heart disease)     HTN - takes BP med, high cholesterol - mange with diet/exercise, smoker  8. PULMONARY RISK FACTORS: "Do you have any history of lung disease?"  (e.g., blood clots in lung, asthma, emphysema, birth control pills)     Asthma 9. CAUSE: "  What do you think is causing the chest pain?"     "I just recently got over being sick, and I'm going through the change so I have to sleep with my window open my room is like a freezer." 10. OTHER SYMPTOMS: "Do you have any other symptoms?" (e.g., dizziness, nausea, vomiting, sweating, fever, difficulty breathing, cough)       Cough for a year 11. PREGNANCY: "Is there any chance you are pregnant?" "When was your last menstrual period?"       Denies  Answer Assessment - Initial Assessment Questions 1. MECHANISM:  "How did the injury happen?"     "I don't know I just have a pain in the center of my chest that wont go away." 2. ONSET: "When did the injury happen?" (Minutes or hours ago)     Month 3. LOCATION: "Where on the chest is the injury located?"     Center chest 4. APPEARANCE: "What does the injury look like?"     Normal 5. BLEEDING: "Is there any bleeding now? If Yes, ask: How long has it been bleeding?"     Denies 6. SEVERITY: "Any difficulty with breathing?"     Denies 7. SIZE: For cuts, bruises, or swelling, ask: "How large is it?" (e.g., inches or centimeters)     Denies 8. PAIN: "Is there pain?" If Yes, ask: "How bad is the pain?"   (e.g., Scale 1-10; or mild, moderate, severe)     4 or 5 9. TETANUS: For any breaks in the skin, ask: "When was the last tetanus booster?"     denies  Protocols used: Chest Pain-A-AH, Chest Injury-A-AH

## 2023-11-04 NOTE — Telephone Encounter (Signed)
 Called patient to get patient scheduled to see Dr. Salomon Fick in the office

## 2023-11-07 ENCOUNTER — Encounter: Payer: Self-pay | Admitting: Family Medicine

## 2023-11-07 ENCOUNTER — Ambulatory Visit (INDEPENDENT_AMBULATORY_CARE_PROVIDER_SITE_OTHER): Payer: 59 | Admitting: Family Medicine

## 2023-11-07 VITALS — BP 136/98 | HR 72 | Temp 98.7°F | Ht 62.0 in | Wt 177.8 lb

## 2023-11-07 DIAGNOSIS — H9313 Tinnitus, bilateral: Secondary | ICD-10-CM

## 2023-11-07 DIAGNOSIS — K649 Unspecified hemorrhoids: Secondary | ICD-10-CM | POA: Diagnosis not present

## 2023-11-07 DIAGNOSIS — K625 Hemorrhage of anus and rectum: Secondary | ICD-10-CM

## 2023-11-07 DIAGNOSIS — M940 Chondrocostal junction syndrome [Tietze]: Secondary | ICD-10-CM

## 2023-11-07 DIAGNOSIS — Z8249 Family history of ischemic heart disease and other diseases of the circulatory system: Secondary | ICD-10-CM

## 2023-11-07 DIAGNOSIS — R42 Dizziness and giddiness: Secondary | ICD-10-CM | POA: Diagnosis not present

## 2023-11-07 DIAGNOSIS — F1029 Alcohol dependence with unspecified alcohol-induced disorder: Secondary | ICD-10-CM

## 2023-11-07 DIAGNOSIS — N951 Menopausal and female climacteric states: Secondary | ICD-10-CM

## 2023-11-07 LAB — CBC WITH DIFFERENTIAL/PLATELET
Basophils Absolute: 0 10*3/uL (ref 0.0–0.1)
Basophils Relative: 0.5 % (ref 0.0–3.0)
Eosinophils Absolute: 0 10*3/uL (ref 0.0–0.7)
Eosinophils Relative: 0.8 % (ref 0.0–5.0)
HCT: 38.9 % (ref 36.0–46.0)
Hemoglobin: 13 g/dL (ref 12.0–15.0)
Lymphocytes Relative: 33.8 % (ref 12.0–46.0)
Lymphs Abs: 2 10*3/uL (ref 0.7–4.0)
MCHC: 33.4 g/dL (ref 30.0–36.0)
MCV: 90.1 fL (ref 78.0–100.0)
Monocytes Absolute: 0.5 10*3/uL (ref 0.1–1.0)
Monocytes Relative: 8.5 % (ref 3.0–12.0)
Neutro Abs: 3.3 10*3/uL (ref 1.4–7.7)
Neutrophils Relative %: 56.4 % (ref 43.0–77.0)
Platelets: 370 10*3/uL (ref 150.0–400.0)
RBC: 4.31 Mil/uL (ref 3.87–5.11)
RDW: 13.5 % (ref 11.5–15.5)
WBC: 5.8 10*3/uL (ref 4.0–10.5)

## 2023-11-07 LAB — COMPREHENSIVE METABOLIC PANEL
ALT: 17 U/L (ref 0–35)
AST: 13 U/L (ref 0–37)
Albumin: 4.2 g/dL (ref 3.5–5.2)
Alkaline Phosphatase: 73 U/L (ref 39–117)
BUN: 11 mg/dL (ref 6–23)
CO2: 28 meq/L (ref 19–32)
Calcium: 9.7 mg/dL (ref 8.4–10.5)
Chloride: 104 meq/L (ref 96–112)
Creatinine, Ser: 0.64 mg/dL (ref 0.40–1.20)
GFR: 100.92 mL/min (ref >60.00)
Glucose, Bld: 119 mg/dL — ABNORMAL HIGH (ref 70–99)
Potassium: 3.9 meq/L (ref 3.5–5.1)
Sodium: 140 meq/L (ref 135–145)
Total Bilirubin: 0.5 mg/dL (ref 0.2–1.2)
Total Protein: 7.3 g/dL (ref 6.0–8.3)

## 2023-11-07 MED ORDER — PREDNISONE 10 MG PO TABS: ORAL_TABLET | ORAL | 0 refills | Status: DC

## 2023-11-07 MED ORDER — GABAPENTIN 300 MG PO CAPS: 300.0000 mg | ORAL_CAPSULE | Freq: Three times a day (TID) | ORAL | 1 refills | Status: AC | PRN

## 2023-11-07 NOTE — Progress Notes (Signed)
 Established Patient Office Visit   Subjective  Patient ID: Kelly Patton, female    DOB: April 08, 1971  Age: 53 y.o. MRN: [id number]  Chief Complaint  Patient presents with   Chest Pain    Started a 1 month ago, mid chest, rate of pain 5 out of 10, "feels like a tight muscle"     Patient is a 53 year old female seen for follow-up on ongoing concern.  Patient seen in ED on 2/8 and in UC 2 weeks ago for continuous midsternal CP.  Patient describes symptoms as a chest tightness x 1 month.  Patient denies SOB, nausea, fatigue, changes in vision, recent heartburn.  Patient states she was given prescription for prednisone at [facility] but never started as last prescription.  Patient does not recall anything that improves symptoms.  Pt mentions seeing BRBPR off and on for a while.  Has hemorrhoids.  Had clots from rectum x 3-4 days and recent dizziness.  Several wks ago noticed dark blood in toilet.  Last colonoscopy 04/29/2023.  Patient with history of alcohol dependence.  Denies frequent NSAID use.  Patient notes continued insomnia.  Patient with continued ringing in ears that makes it difficult to focus and sleep.  Patient would like screening for aneurysm as her brother and her father both had.  Patient requesting refill on gabapentin.  Still having hot flashes due to menopause.    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 11/21/2021   PTSD (post-traumatic stress disorder) 12/28/2020   Substance abuse (HCC) 12/16/2020   Alcohol dependence with unspecified alcohol-induced disorder (HCC) 12/16/2020   Left carpal tunnel syndrome 08/10/2020   Primary osteoarthritis of first carpometacarpal joint of left hand 08/10/2020   History of 2019 novel coronavirus disease (COVID-19) 07/19/2020   Arthritis 07/19/2020   Lumbar back pain 11/11/2019   Trigger thumb, right thumb 12/09/2017   Essential hypertension 07/17/2017   Primary  insomnia 07/17/2017   Past Medical History:  Diagnosis Date   Allergies    Anxiety    Arthritis    Carpal tunnel syndrome, right    Depression    ETOH abuse    daily marijuana   GERD (gastroesophageal reflux disease)    Hypertension    Shortness of breath dyspnea    with exertion uses inhaler   Past Surgical History:  Procedure Laterality Date   CARPAL TUNNEL RELEASE Right 01/31/2016   Procedure: CARPAL TUNNEL RELEASE;  Surgeon: Cammy Copa, MD;  Location: [facility] OR;  Service: Orthopedics;  Laterality: Right;   COLONOSCOPY     COLONOSCOPY WITH ESOPHAGOGASTRODUODENOSCOPY (EGD)  04/29/2023   Ileene [facility] at [facility]   FINGER SURGERY Right    ring finger, due to knife cut   HAND TENDON SURGERY Right    TRIGGER FINGER RELEASE Right 11/26/2017   Procedure: RELEASE TRIGGER FINGER RIGHT THUMB;  Surgeon: Cammy Copa, MD;  Location: [facility] OR;  Service: Orthopedics;  Laterality: Right;   TRIGGER FINGER RELEASE Left 04/04/2022   Procedure: LEFT TRIGGER THUMB RELEASE;  Surgeon: Tarry Kos, MD;  Location: [facility];  Service: Orthopedics;  Laterality: Left;   UPPER GASTROINTESTINAL ENDOSCOPY     Social History   Tobacco Use   Smoking status: Every Day    Types: Cigarettes   Smokeless tobacco: Never  Vaping Use   Vaping status: Never Used  Substance Use Topics   Alcohol use: Not Currently    Comment: daily-3-4  shots/day   Drug use: Yes    Types: Marijuana   Family History  Problem Relation Age of Onset   Hypertension Father    Hypertension Brother    Hypertension Paternal Grandmother    Hypertension Paternal Grandfather    Alcoholism Paternal Grandfather    Colon cancer Paternal Grandfather    Breast cancer Neg Hx    Rectal cancer Neg Hx    Stomach cancer Neg Hx    Esophageal cancer Neg Hx    Allergies  Allergen Reactions   Acetaminophen Other (See Comments)    Told not to take due to "liver spot"   Aspirin Other (See Comments)    Told not  to take due to "liver spot"   Nsaids Other (See Comments)    Told not to take due to "liver spot"      ROS Negative unless stated above    Objective:     BP (!) 134/96 (BP Location: Left Arm, Patient Position: Sitting, Cuff Size: Normal)   Pulse 72   Temp 98.7 F (37.1 C) (Oral)   Ht 5\' 2"  (1.575 m)   Wt 177 lb 12.8 oz (80.6 kg)   LMP  (LMP Unknown)   SpO2 97%   BMI 32.52 kg/m  BP Readings from Last 3 Encounters:  11/07/23 (!) 136/98  10/17/23 (!) 155/98  07/26/23 102/74   Wt Readings from Last 3 Encounters:  11/07/23 177 lb 12.8 oz (80.6 kg)  07/26/23 182 lb 6.4 oz (82.7 kg)  04/29/23 172 lb (78 kg)      Physical Exam Constitutional:      General: She is not in acute distress.    Appearance: Normal appearance.  HENT:     Head: Normocephalic and atraumatic.     Right Ear: Hearing, tympanic membrane, ear canal and external ear normal.     Left Ear: Hearing, tympanic membrane, ear canal and external ear normal.     Nose: Nose normal.     Mouth/Throat:     Mouth: Mucous membranes are moist.  Neck:     Thyroid: No thyromegaly or thyroid tenderness.     Vascular: No carotid bruit.  Cardiovascular:     Rate and Rhythm: Normal rate and regular rhythm.     Chest Wall: PMI is not displaced.     Heart sounds: Normal heart sounds. No murmur heard.    No gallop.  Pulmonary:     Effort: Pulmonary effort is normal. No respiratory distress.     Breath sounds: Normal breath sounds. No wheezing, rhonchi or rales.  Genitourinary:    Comments: DRE declined. Musculoskeletal:     Comments: TTP of sternum.  Skin:    General: Skin is warm and dry.  Neurological:     Mental Status: She is alert and oriented to person, place, and time.      No results found for any visits on 11/07/23.    11/07/2023    8:54 AM 07/26/2023    1:47 PM 02/27/2023    5:08 PM  Depression screen PHQ 2/9  Decreased Interest 2 0 0  Down, Depressed, Hopeless 0 0 0  PHQ - 2 Score 2 0 0   Altered sleeping 3 3 3   Tired, decreased energy 2 2 2   Change in appetite 0 0 2  Feeling bad or failure about yourself  0 0 0  Trouble concentrating 3 0 2  Moving slowly or fidgety/restless 0 0 0  Suicidal thoughts 0 0 0  PHQ-9  Score 10 5 9   Difficult doing work/chores  Not difficult at all        11/07/2023    8:55 AM 07/26/2023    1:47 PM 02/27/2023    5:09 PM 07/27/2020    2:10 PM  GAD 7 : Generalized Anxiety Score  Nervous, Anxious, on Edge 0 0 3 1  Control/stop worrying 0 0 3 2  Worry too much - different things 0 0 3 3  Trouble relaxing 2 2 3 3   Restless 0 2 3 3   Easily annoyed or irritable 2 1 3 2   Afraid - awful might happen 2 2 3 2   Total GAD 7 Score 6 7 21 16   Anxiety Difficulty   Somewhat difficult Somewhat difficult    Assessment & Plan:  Costochondritis -Patient with continued chest tightness. -Notes/workup from ED visit 2/8 reviewed. -Discussed possible causes of symptoms.  Costochondritis more likely as symptoms on exam. -     CBC with Differential/Platelet; Future -     predniSONE; Take 5 tabs on day 1, 4 tabs on day 2, 3 tabs on day 3, 2 tabs on day 4, 1 tab on day 5.  Dispense: 15 tablet; Refill: 0  Tinnitus of both ears -Discussed limited in treatment options.  Advised symptoms may continue indefinitely. -Given family history of aneurysm discussed CTA -     CBC with Differential/Platelet; Future -     Iron, TIBC and Ferritin Panel; Future -     CT ANGIO HEAD NECK W WO CM; Future  Hemorrhoids, unspecified hemorrhoid type -DRE declined. -Colonoscopy done 04/29/2023 -Given recurrent symptoms and recent bleeding discussed referral to GI. -Will obtain labs. -Given strict precautions for continued or worsening symptoms. -     CBC with Differential/Platelet; Future -     Ambulatory referral to Gastroenterology  BRBPR (bright red blood per rectum) -Advised symptoms likely 2/2 hemorrhoids.  Also consider fissure. -Given intermittent dizziness, history of  hemorrhoids, clots, discussed obtaining labs and referral to GI. -Given strict precautions -     CBC with Differential/Platelet; Future -     Iron, TIBC and Ferritin Panel; Future -     Ambulatory referral to Gastroenterology  Dizziness -Patient reports intermittent dizziness.  Concern for anemia given recent BRBPR and report of clots -Discussed UGIB versus LGIB based on history of alcohol use and hemorrhoids.  Also consider aneurysm given family history. -     CBC with Differential/Platelet; Future -     Comprehensive metabolic panel; Future -     Iron, TIBC and Ferritin Panel; Future -     CT ANGIO HEAD NECK W WO CM; Future  Alcohol dependence with unspecified alcohol-induced disorder (HCC) -pt encouraged to cut down on alcohol intake.  Advised against quitting cold [place].  Using as a coping method due to h/o PTSD. -Continue to monitor  Hot flashes due to menopause -     Gabapentin; Take 1 capsule (300 mg total) by mouth 3 (three) times daily as needed.  Dispense: 90 capsule; Refill: 1  Family history of aneurysm -     CT ANGIO HEAD NECK W WO CM; Future   Return if symptoms worsen or fail to improve.   [name], MD

## 2023-11-08 LAB — IRON,TIBC AND FERRITIN PANEL
%SAT: 19 % (ref 16–45)
Ferritin: 115 ng/mL (ref 16–232)
Iron: 83 ug/dL (ref 45–160)
TIBC: 435 ug/dL (ref 250–450)

## 2023-11-14 ENCOUNTER — Ambulatory Visit: Payer: Self-pay | Admitting: Family Medicine

## 2023-11-14 NOTE — Telephone Encounter (Signed)
  Chief Complaint: blood clots noted in stool , stool color black.  Symptoms: reports rectal bleeding since colonoscopy.  4-5 days ago noted more rectal bleeding. Thought it was hemorrhoids but still having episodes of bleeding . Blood clots quarter size. Stool soft not liquid. Stool black in color. Report she did drink some liquor but has stopped and still bleeding noted.  Frequency: 4-5 days worsening  Pertinent Negatives: Patient denies chest pain no difficulty breathing no dizziness no abdominal pain no vomiting no fever Disposition: [] ED /[] Urgent Care (no appt availability in office) / [] Appointment(In office/virtual)/ []  [facility]/ [] Home Care/ [x] Refused Recommended Disposition /[] [facility]/ []  Follow-up with PCP Additional Notes:   Recommended ED now. Patient reports she is at work and can not afford to leave or afford ED when she has to wait for hours and told to see PCP. Requesting GI referral. Please advise. Patient would like a call back if she can be seen by a provider instead of going to ED. CAL notified of patient refused ED.        Copied from CRM [id number]. Topic: Clinical - Red Word Triage >> Nov 14, 2023 10:15 AM Marica Otter wrote: [facility] that prompted transfer to Nurse Triage: Patient called in stating she has blood clots again and black stool. Phone disconnected agent tried to reach back out to patient and received patients voicemail and no voicemail setup. Reason for Disposition  [1] MODERATE rectal bleeding (small blood clots, passing blood without stool, or toilet water turns red) AND [2] more than once a day  Answer Assessment - Initial Assessment Questions 1. APPEARANCE of BLOOD: "What color is it?" "Is it passed separately, on the surface of the stool, or mixed in with the stool?"      Blood clots quarter size in stool and commode and with  wiping noted bright red blood  2. AMOUNT: "How much blood was passed?"      unsure 3. FREQUENCY:  "How many times has blood been passed with the stools?"      Happening more often in 4-5 days once a week last month more last couple of days  4. ONSET: "When was the blood first seen in the stools?" (Days or weeks)      Since colonscopy  5. DIARRHEA: "Is there also some diarrhea?" If Yes, ask: "How many diarrhea stools in the past 24 hours?"      Soft stool no diarrhea 6. CONSTIPATION: "Do you have constipation?" If Yes, ask: "How bad is it?"     na 7. RECURRENT SYMPTOMS: "Have you had blood in your stools before?" If Yes, ask: "When was the last time?" and "What happened that time?"      Yes  8. BLOOD THINNERS: "Do you take any blood thinners?" (e.g., Coumadin/warfarin, Pradaxa/dabigatran, aspirin)     na 9. OTHER SYMPTOMS: "Do you have any other symptoms?"  (e.g., abdomen pain, vomiting, dizziness, fever)     None at this time only blood clots in stool. 10. PREGNANCY: "Is there any chance you are pregnant?" "When was your last menstrual period?"       na  Protocols used: Rectal Bleeding-A-AH

## 2023-11-14 NOTE — Telephone Encounter (Signed)
 Spoke with patient the referral has been place from [facility]. Gave the patient [facility] information so she can call and schedule appt. I have advised patient to go to the ED and be seen. Patient has refused

## 2024-02-11 DIAGNOSIS — I160 Hypertensive urgency: Secondary | ICD-10-CM | POA: Diagnosis not present

## 2024-02-11 DIAGNOSIS — I249 Acute ischemic heart disease, unspecified: Secondary | ICD-10-CM | POA: Diagnosis not present

## 2024-02-11 DIAGNOSIS — I10 Essential (primary) hypertension: Secondary | ICD-10-CM | POA: Diagnosis not present

## 2024-02-11 DIAGNOSIS — R079 Chest pain, unspecified: Secondary | ICD-10-CM | POA: Diagnosis not present

## 2024-02-11 DIAGNOSIS — R000 Tachycardia, unspecified: Secondary | ICD-10-CM | POA: Diagnosis not present

## 2024-02-11 DIAGNOSIS — G629 Polyneuropathy, unspecified: Secondary | ICD-10-CM | POA: Diagnosis not present

## 2024-02-11 DIAGNOSIS — I517 Cardiomegaly: Secondary | ICD-10-CM | POA: Diagnosis not present

## 2024-02-12 DIAGNOSIS — G629 Polyneuropathy, unspecified: Secondary | ICD-10-CM | POA: Diagnosis not present

## 2024-02-12 DIAGNOSIS — Z79899 Other long term (current) drug therapy: Secondary | ICD-10-CM | POA: Diagnosis not present

## 2024-02-12 DIAGNOSIS — I249 Acute ischemic heart disease, unspecified: Secondary | ICD-10-CM | POA: Diagnosis not present

## 2024-02-12 DIAGNOSIS — I160 Hypertensive urgency: Secondary | ICD-10-CM | POA: Diagnosis not present

## 2024-02-12 DIAGNOSIS — R079 Chest pain, unspecified: Secondary | ICD-10-CM | POA: Diagnosis not present

## 2024-02-12 DIAGNOSIS — F17210 Nicotine dependence, cigarettes, uncomplicated: Secondary | ICD-10-CM | POA: Diagnosis not present

## 2024-02-12 DIAGNOSIS — E781 Pure hyperglyceridemia: Secondary | ICD-10-CM | POA: Diagnosis not present

## 2024-02-13 ENCOUNTER — Telehealth: Payer: Self-pay

## 2024-02-13 NOTE — Transitions of Care (Post Inpatient/ED Visit) (Signed)
   02/13/2024  Name: Kelly Patton MRN: [id number] DOB: [birth year]  Today's TOC FU Call Status: Today's TOC FU Call Status:: Unsuccessful Call (1st Attempt) Unsuccessful Call (1st Attempt) Date: 02/13/24  Attempted to reach the patient regarding the most recent Inpatient/ED visit.  Follow Up Plan: Additional outreach attempts will be made to reach the patient to complete the Transitions of Care (Post Inpatient/ED visit) call.   Signature Darrall Ellison, LPN [facility] Nurse Health Advisor Direct Dial [phone]

## 2024-02-17 NOTE — Transitions of Care (Post Inpatient/ED Visit) (Unsigned)
   02/17/2024  Name: Kelly Patton MRN: [id number] DOB: 1971-06-21  Today's TOC FU Call Status: Today's TOC FU Call Status:: Unsuccessful Call (2nd Attempt) Unsuccessful Call (1st Attempt) Date: 02/13/24 Unsuccessful Call (2nd Attempt) Date: 02/17/24  Attempted to reach the patient regarding the most recent Inpatient/ED visit.  Follow Up Plan: Additional outreach attempts will be made to reach the patient to complete the Transitions of Care (Post Inpatient/ED visit) call.   Signature Darrall Ellison, LPN [facility] Nurse Health Advisor Direct Dial [phone]

## 2024-02-18 NOTE — Transitions of Care (Post Inpatient/ED Visit) (Signed)
   02/18/2024  Name: Kelly Patton MRN: [id number] DOB: Sep 05, 1971  Today's TOC FU Call Status: Today's TOC FU Call Status:: Unsuccessful Call (3rd Attempt) Unsuccessful Call (1st Attempt) Date: 02/13/24 Unsuccessful Call (2nd Attempt) Date: 02/17/24 Unsuccessful Call (3rd Attempt) Date: 02/18/24  Attempted to reach the patient regarding the most recent Inpatient/ED visit.  Follow Up Plan: No further outreach attempts will be made at this time. We have been unable to contact the patient.  Signature Darrall Ellison, LPN [facility] Nurse Health Advisor Direct Dial [phone]

## 2024-02-25 ENCOUNTER — Telehealth: Payer: Self-pay

## 2024-02-25 NOTE — Telephone Encounter (Signed)
 Called patient unable to leave a VM, Dr. Arliss Lam would need to see patient in the office for her appt 6/18 hospital follow-up

## 2024-02-25 NOTE — Telephone Encounter (Signed)
 Sent mychart message also to pt with the below info and kayla  e2c2 will also try to get into with pt she said the pt was wanting nausea medication and I told kayla the pt must still be seen

## 2024-02-26 ENCOUNTER — Inpatient Hospital Stay: Admitting: Family Medicine

## 2024-02-28 ENCOUNTER — Encounter: Payer: Self-pay | Admitting: Family Medicine

## 2024-03-05 ENCOUNTER — Ambulatory Visit: Payer: Self-pay

## 2024-03-05 NOTE — Telephone Encounter (Signed)
 FYI Only or Action Required?: Action required by provider: request for appointment.  Patient was last seen in primary care on 11/07/2023 by Mercer Clotilda SAUNDERS, MD. Called Nurse Triage reporting Hypertension. Symptoms began today. Interventions attempted: Rest, hydration, or home remedies. Symptoms are: unchanged.  Triage Disposition: Go to ED Now (Notify PCP)-patient refusing ED but is going to go to Urgent Care.   Patient/caregiver understands and will follow disposition?: Yes  Copied from CRM [id number]. Topic: Clinical - Red Word Triage >> Mar 05, 2024  3:14 PM Ernestene P wrote: Red Word that prompted transfer to Nurse Triage: bp is 165/122 want to know can she take bp medicine Reason for Disposition  [1] Systolic BP  >= 160 OR Diastolic >= 100 AND [2] cardiac (e.g., breathing difficulty, chest pain) or neurologic symptoms (e.g., new-onset blurred or double vision, unsteady gait)  Answer Assessment - Initial Assessment Questions 1. BLOOD PRESSURE: What is the blood pressure? Did you take at least two measurements 5 minutes apart?     165/122-left arm     172/126-right arm 2. ONSET: When did you take your blood pressure?     Patient is at work 3. HOW: How did you take your blood pressure? (e.g., automatic home BP monitor, visiting nurse)     Automatic blood pressure machine 4. HISTORY: Do you have a history of high blood pressure?     yes 5. MEDICINES: Are you taking any medicines for blood pressure? Have you missed any doses recently?     Benicar  6. OTHER SYMPTOMS: Do you have any symptoms? (e.g., blurred vision, chest pain, difficulty breathing, headache, weakness)     Reports chest tightness for over an hour, no other symptoms.   Patient reports that she was seen three weeks ago at an outside facility for chest pain and hypertension. Patient reports that her benicar  was increased to 20mg  from 5 mg. Patient endorses chest tightness for over an hour today. Patient recommended  to the ED for evaluation. Patient reports she will go to Urgent Care. Patient is needing a follow up phone call from PCP.  Protocols used: Blood Pressure - High-A-AH

## 2024-04-11 DIAGNOSIS — S59912A Unspecified injury of left forearm, initial encounter: Secondary | ICD-10-CM | POA: Diagnosis not present

## 2024-04-11 DIAGNOSIS — S40021A Contusion of right upper arm, initial encounter: Secondary | ICD-10-CM | POA: Diagnosis not present

## 2024-04-11 DIAGNOSIS — M79631 Pain in right forearm: Secondary | ICD-10-CM | POA: Diagnosis not present

## 2024-04-11 DIAGNOSIS — S59911A Unspecified injury of right forearm, initial encounter: Secondary | ICD-10-CM | POA: Diagnosis not present

## 2024-04-11 DIAGNOSIS — S40022A Contusion of left upper arm, initial encounter: Secondary | ICD-10-CM | POA: Diagnosis not present

## 2024-04-11 DIAGNOSIS — M7041 Prepatellar bursitis, right knee: Secondary | ICD-10-CM | POA: Diagnosis not present

## 2024-04-11 DIAGNOSIS — M25461 Effusion, right knee: Secondary | ICD-10-CM | POA: Diagnosis not present

## 2024-04-11 DIAGNOSIS — W19XXXA Unspecified fall, initial encounter: Secondary | ICD-10-CM | POA: Diagnosis not present

## 2024-04-11 DIAGNOSIS — W01198A Fall on same level from slipping, tripping and stumbling with subsequent striking against other object, initial encounter: Secondary | ICD-10-CM | POA: Diagnosis not present

## 2024-04-26 ENCOUNTER — Ambulatory Visit: Admission: EM | Admit: 2024-04-26 | Discharge: 2024-04-26 | Disposition: A | Discharge status: Home / Self Care

## 2024-04-26 DIAGNOSIS — K0889 Other specified disorders of teeth and supporting structures: Secondary | ICD-10-CM

## 2024-04-26 MED ORDER — CHLORHEXIDINE GLUCONATE 0.12 % MT SOLN: 15.0000 mL | OROMUCOSAL | 0 refills | Status: AC

## 2024-04-26 MED ORDER — NAPROXEN 500 MG PO TABS: 500.0000 mg | ORAL_TABLET | Freq: Two times a day (BID) | ORAL | 0 refills | Status: AC

## 2024-04-26 MED ORDER — AMOXICILLIN 875 MG PO TABS
875.0000 mg | ORAL_TABLET | ORAL | 0 refills | Status: AC
Start: 2024-04-26 — End: 2024-05-03
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
## 2024-04-26 NOTE — ED Triage Notes (Signed)
 About a month + ago I had something in my teeth at work, I tried to get this out of my mouth/teeth with a bag tie and may have injured my tooth or gum (upper right side) and now the pain is really bad. No fever.

## 2024-04-26 NOTE — ED Notes (Signed)
 Unable to use standing order for Tylenol  due to contraindication.

## 2024-04-26 NOTE — Discharge Instructions (Addendum)
 You were seen today for dental pain. The exam showed tooth decay (cavities) with some swelling of the gums, which is likely causing your discomfort. There were no abscesses or gum lesions seen. It is very important that you see a dentist as soon as possible to fully treat the problem. You were given a list of clinics that provide free or reduced-cost dental care--please schedule an appointment with whichever can see you the soonest.  Take the antibiotics exactly as prescribed to help prevent or treat infection. Use the prescribed mouth rinse twice a day after brushing to keep the area clean.  Do not eat, drink or smoke for 30 minutes after using the prescribed mouth rinse.  Do not rinse your mouth out after using the prescribed mouth rinse as well.  After meals, rinse your mouth with warm salt water to remove food particles and reduce irritation.  For pain, take naproxen  twice a day with food. Do not take aspirin or any other over-the-counter NSAIDs while you are taking this medication. Avoid foods and drinks that make your pain worse. Stick to soft foods as much as possible, and avoid using straws, as this can slow down healing.

## 2024-05-09 ENCOUNTER — Other Ambulatory Visit: Payer: Self-pay

## 2024-05-09 ENCOUNTER — Emergency Department (HOSPITAL_COMMUNITY)
Admission: EM | Admit: 2024-05-09 | Discharge: 2024-05-09 | Disposition: A | Discharge status: Home / Self Care | Attending: Emergency Medicine | Admitting: Emergency Medicine

## 2024-05-09 DIAGNOSIS — Z79899 Other long term (current) drug therapy: Secondary | ICD-10-CM | POA: Diagnosis not present

## 2024-05-09 DIAGNOSIS — K644 Residual hemorrhoidal skin tags: Secondary | ICD-10-CM | POA: Insufficient documentation

## 2024-05-09 DIAGNOSIS — I10 Essential (primary) hypertension: Secondary | ICD-10-CM | POA: Insufficient documentation

## 2024-05-09 DIAGNOSIS — K625 Hemorrhage of anus and rectum: Secondary | ICD-10-CM | POA: Diagnosis not present

## 2024-05-09 LAB — COMPREHENSIVE METABOLIC PANEL WITH GFR
ALT: 26 U/L (ref 0–44)
AST: 42 U/L — ABNORMAL HIGH (ref 15–41)
Albumin: 3.8 g/dL (ref 3.5–5.0)
Alkaline Phosphatase: 71 U/L (ref 38–126)
Anion gap: 10 (ref 5–15)
BUN: 14 mg/dL (ref 6–20)
CO2: 24 mmol/L (ref 22–32)
Calcium: 9.3 mg/dL (ref 8.9–10.3)
Chloride: 100 mmol/L (ref 98–111)
Creatinine, Ser: 0.66 mg/dL (ref 0.44–1.00)
GFR, Estimated: >60 mL/min (ref >60)
Glucose, Bld: 102 mg/dL — ABNORMAL HIGH (ref 70–99)
Potassium: 5 mmol/L (ref 3.5–5.1)
Sodium: 134 mmol/L — ABNORMAL LOW (ref 135–145)
Total Bilirubin: 1.6 mg/dL — ABNORMAL HIGH (ref 0.0–1.2)
Total Protein: 6.6 g/dL (ref 6.5–8.1)

## 2024-05-09 LAB — CBC WITH DIFFERENTIAL/PLATELET
Abs Immature Granulocytes: 0.03 K/uL (ref 0.00–0.07)
Basophils Absolute: 0 K/uL (ref 0.0–0.1)
Basophils Relative: 1 %
Eosinophils Absolute: 0.1 K/uL (ref 0.0–0.5)
Eosinophils Relative: 1 %
HCT: 40 % (ref 36.0–46.0)
Hemoglobin: 13.1 g/dL (ref 12.0–15.0)
Immature Granulocytes: 1 %
Lymphocytes Relative: 45 %
Lymphs Abs: 2.4 K/uL (ref 0.7–4.0)
MCH: 30.6 pg (ref 26.0–34.0)
MCHC: 32.8 g/dL (ref 30.0–36.0)
MCV: 93.5 fL (ref 80.0–100.0)
Monocytes Absolute: 0.4 K/uL (ref 0.1–1.0)
Monocytes Relative: 8 %
Neutro Abs: 2.3 K/uL (ref 1.7–7.7)
Neutrophils Relative %: 44 %
Platelets: 334 K/uL (ref 150–400)
RBC: 4.28 MIL/uL (ref 3.87–5.11)
RDW: 13 % (ref 11.5–15.5)
WBC: 5.2 K/uL (ref 4.0–10.5)
nRBC: 0 % (ref 0.0–0.2)

## 2024-05-09 LAB — SAMPLE TO BLOOD BANK

## 2024-05-09 LAB — PROTIME-INR
INR: 0.9 (ref 0.8–1.2)
Prothrombin Time: 12.2 s (ref 11.4–15.2)

## 2024-05-09 LAB — POC OCCULT BLOOD, ED: Fecal Occult Bld: NEGATIVE

## 2024-05-09 MED ORDER — POLYETHYLENE GLYCOL 3350 17 GM/SCOOP PO POWD: 17.0000 g | Freq: Two times a day (BID) | ORAL | 0 refills | Status: AC

## 2024-05-09 NOTE — ED Triage Notes (Signed)
 Patient reports rectal bleeding/clots onset last night , denies injury , no fever or chills.

## 2024-05-09 NOTE — ED Provider Notes (Signed)
 [place] [facility] AT [facility] Provider Note   CSN: [id number] Arrival date & time: 05/09/24  9363     Patient presents with: Rectal Bleeding   Kelly Patton is a 53 y.o. female.    Rectal Bleeding  Patient is a 53 year old female with a past medical history significant for HTN, reflux, depression, shortness of breath  Patient presents emergency room today with complaints of rectal bleeding.  She states that she had episode of bleeding last night and an episode this morning.  She denies any lightheadedness or dizziness.  No chest pain or difficulty breathing.  She has some crampy abdominal pain which seems to be gone now.  No fevers or chills no history of diverticulitis although CT imaging has shown diverticulosis in the past.  She states that she had a colonoscopy approximately 1 year ago with [facility] gastroenterology.  She denies any other symptoms today no nausea headaches or significant pain.  She states that she does have some rectal discomfort however she states that this is not new and she has a history of external hemorrhoids.  She states that the rectal bleeding is bright red blood PR she noticed this with wiping but did notice a small amount of blood in the toilet bowl as well      Prior to Admission medications   Medication Sig Start Date End Date Taking? Authorizing Provider  albuterol  (VENTOLIN  HFA) 108 (90 Base) MCG/ACT inhaler Inhale 2 puffs into the lungs every 6 (six) hours as needed for wheezing or shortness of breath. 10/17/23   Christopher Savannah, PA-C  albuterol  (VENTOLIN  HFA) 108 (90 Base) MCG/ACT inhaler Inhale 2 puffs into the lungs every 6 (six) hours as needed. 10/17/23   [provider]  gabapentin  (NEURONTIN ) 300 MG capsule Take 1 capsule (300 mg total) by mouth 3 (three) times daily as needed. 11/07/23   Mercer Clotilda SAUNDERS, MD  gabapentin  (NEURONTIN ) 300 MG capsule Take 300 mg by mouth 3 (three) times daily. 11/07/23    [provider]  GARLIC PO Take by mouth.    [provider]  metoprolol succinate (TOPROL-XL) 25 MG 24 hr tablet Take 25 mg by mouth daily. 03/11/24   [provider]  naproxen  (NAPROSYN ) 500 MG tablet Take 1 tablet (500 mg total) by mouth 2 (two) times daily with a meal. Take with food to avoid stomach upset. Do not take any additional NSAIDs while on this. You may take tylenol  in addition to this if needed for extra pain relief. 04/26/24   Murrill, Samantha, FNP  olmesartan  (BENICAR ) 5 MG tablet Take 1 tablet (5 mg total) by mouth daily. 02/27/23   Mercer Clotilda SAUNDERS, MD  omega-3 acid ethyl esters (LOVAZA) 1 g capsule Take 1 g by mouth daily. 02/12/24   [provider]  predniSONE  (DELTASONE ) 10 MG tablet Take 5 tabs on day 1, 4 tabs on day 2, 3 tabs on day 3, 2 tabs on day 4, 1 tab on day 5. 11/07/23   Mercer Clotilda SAUNDERS, MD    Allergies: Acetaminophen , Aspirin, and Nsaids    Review of Systems  Gastrointestinal:  Positive for hematochezia.    Updated Vital Signs BP (!) 160/94 (BP Location: Right Arm)   Pulse 77   Temp 98.3 F (36.8 C) (Oral)   Resp 18   LMP  (LMP Unknown)   SpO2 99%   Physical Exam Vitals and nursing note reviewed.  Constitutional:      General: She is  not in acute distress. HENT:     Head: Normocephalic and atraumatic.     Nose: Nose normal.  Eyes:     General: No scleral icterus. Cardiovascular:     Rate and Rhythm: Normal rate and regular rhythm.     Pulses: Normal pulses.     Heart sounds: Normal heart sounds.  Pulmonary:     Effort: Pulmonary effort is normal. No respiratory distress.     Breath sounds: No wheezing.  Abdominal:     Palpations: Abdomen is soft.     Tenderness: There is no abdominal tenderness. There is no guarding or rebound.  Genitourinary:    Comments: Chaperoned exam w RN Empty rectal vault Musculoskeletal:     Cervical back: Normal range of motion.     Right lower leg: No edema.     Left lower leg:  No edema.  Skin:    General: Skin is warm and dry.     Capillary Refill: Capillary refill takes less than 2 seconds.  Neurological:     Mental Status: She is alert. Mental status is at baseline.  Psychiatric:        Mood and Affect: Mood normal.        Behavior: Behavior normal.   Internal and external hemorrhoids noted on exam.  (all labs ordered are listed, but only abnormal results are displayed) Labs Reviewed  COMPREHENSIVE METABOLIC PANEL WITH GFR - Abnormal; Notable for the following components:      Result Value   Sodium 134 (*)    Glucose, Bld 102 (*)    AST 42 (*)    Total Bilirubin 1.6 (*)    All other components within normal limits  CBC WITH DIFFERENTIAL/PLATELET  PROTIME-INR  POC OCCULT BLOOD, ED  SAMPLE TO BLOOD BANK    EKG: None  Radiology: No results found.   Procedures   Medications Ordered in the ED - No data to display                                  Medical Decision Making Amount and/or Complexity of Data Reviewed Labs: ordered.  Risk OTC drugs.   Patient is a 53 year old female with a past medical history significant for HTN, reflux, depression, shortness of breath  Patient presents emergency room today with complaints of rectal bleeding.  She states that she had episode of bleeding last night and an episode this morning.  She denies any lightheadedness or dizziness.  No chest pain or difficulty breathing.  She has some crampy abdominal pain which seems to be gone now.  No fevers or chills no history of diverticulitis although CT imaging has shown diverticulosis in the past.  She states that she had a colonoscopy approximately 1 year ago with [facility] gastroenterology.  She denies any other symptoms today no nausea headaches or significant pain.  She states that she does have some rectal discomfort however she states that this is not new and she has a history of external hemorrhoids.  She states that the rectal bleeding is bright red blood PR  she noticed this with wiping but did notice a small amount of blood in the toilet bowl as well   The differential diagnosis for lower GI bleed includes but is not limited to high flow upper GI bleed, diverticulosis, vascular ectasia/AVM, inflammatory bowel disease, infectious colitis, mesenteric ischemia or ischemic colitis,  colorectal cancer or polyps, internal hemorrhoids, aortoenteric fistula,  rectal foreign body, rectal ulceration or anal fissure.  Patient with no abdominal pain or abdominal tenderness low suspicion for diverticulitis.  Her description of bright red blood with wiping is consistent with hemorrhoid and she has internal and external hemorrhoids on exam.  CBC shows hemoglobin of 13.1 CMP unremarkable BUN normal, normal coags.  Hemoccult negative.  Will discharge home with outpatient follow-up with gastroenterology.  Return precautions to the emergency room provided.  Recommend MiraLAX  2-3 times daily given that she is forcing herself to poop   Final diagnoses:  Rectal bleeding    ED Discharge Orders          Ordered    polyethylene glycol powder (GLYCOLAX /MIRALAX ) 17 GM/SCOOP powder  2 times daily        05/09/24 0851               Neldon Hamp RAMAN, PA 05/09/24 1545    Francesca Elsie CROME, MD 05/09/24 [phone]

## 2024-05-09 NOTE — Discharge Instructions (Signed)
 You have some rectal bleeding this very well may be related to the hemorrhoids that you have.  Use Preparation H wipes, sitz bath's (instructions printed) and increase your fiber.  I recommend taking MiraLAX  twice daily 17 g or one scoop full twice daily. Once you drink the MiraLAX  for look up up immediately and drink another full glass of water.  Make sure you are drinking plenty of water throughout the day.  Gentle exercise, walking, running, jogging, cycling helpful and making sure that your bowels are moving.  Follow-up with the gastroenterologist.  Return the emergency room for any shortness of breath, lightheadedness, dizziness, worsening bleeding.

## 2024-05-13 ENCOUNTER — Telehealth: Payer: Self-pay

## 2024-05-13 DIAGNOSIS — L219 Seborrheic dermatitis, unspecified: Secondary | ICD-10-CM

## 2024-05-13 NOTE — Addendum Note (Signed)
 Addended by: BRIEN SONG A on: 05/13/2024 11:55 AM   Modules accepted: Orders

## 2024-05-13 NOTE — Telephone Encounter (Signed)
 Copied from CRM #[id number]. Topic: Appointments - Scheduling Inquiry for Clinic >> May 13, 2024  8:35 AM Kelly Patton wrote: Reason for CRM: Pt wanted to do a VV for a hospital follow up-states this is about her skin and she does not want to come into the office. She refused to go into detail as to the reason for the appt. States this is something personal that her and Dr. Mercer has talked about already. I scheduled her a VV for Monday. Is this okay? She is also requesting a callback today if possible from Dr. Mercer stating this is very important but again refused to go into detail as to the reason.  Callback number for patient is [phone]

## 2024-05-13 NOTE — Telephone Encounter (Signed)
 Copied from CRM #[id number]. Topic: General - Other >> May 13, 2024  8:34 AM Berneda FALCON wrote: Reason for CRM: Pt would like a call today if possible by Dr Mercer. She states this is very important but would not go into detail as to why.  The callback is [phone]

## 2024-05-13 NOTE — Telephone Encounter (Signed)
 Spoke with patient, she is having itching and skin crawling on her scalp, patient would like to have a Video Vist, per Dr. Mercer it is okay to place a referral to derm

## 2024-05-18 ENCOUNTER — Telehealth (INDEPENDENT_AMBULATORY_CARE_PROVIDER_SITE_OTHER): Admitting: Family Medicine

## 2024-05-18 ENCOUNTER — Encounter: Payer: Self-pay | Admitting: Family Medicine

## 2024-05-18 DIAGNOSIS — L219 Seborrheic dermatitis, unspecified: Secondary | ICD-10-CM

## 2024-05-18 MED ORDER — ITRACONAZOLE 100 MG PO CAPS: 200.0000 mg | ORAL_CAPSULE | Freq: Every day | ORAL | 0 refills | Status: AC
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
## 2024-05-28 ENCOUNTER — Emergency Department (HOSPITAL_COMMUNITY)

## 2024-05-28 ENCOUNTER — Emergency Department (HOSPITAL_COMMUNITY)
Admission: EM | Admit: 2024-05-28 | Discharge: 2024-05-28 | Disposition: A | Discharge status: Home / Self Care | Attending: Emergency Medicine | Admitting: Emergency Medicine

## 2024-05-28 ENCOUNTER — Other Ambulatory Visit: Payer: Self-pay

## 2024-05-28 ENCOUNTER — Encounter (HOSPITAL_COMMUNITY): Payer: Self-pay

## 2024-05-28 DIAGNOSIS — J45909 Unspecified asthma, uncomplicated: Secondary | ICD-10-CM | POA: Diagnosis not present

## 2024-05-28 DIAGNOSIS — I10 Essential (primary) hypertension: Secondary | ICD-10-CM | POA: Insufficient documentation

## 2024-05-28 DIAGNOSIS — R0789 Other chest pain: Secondary | ICD-10-CM | POA: Diagnosis not present

## 2024-05-28 DIAGNOSIS — Z8616 Personal history of COVID-19: Secondary | ICD-10-CM | POA: Diagnosis not present

## 2024-05-28 DIAGNOSIS — J45901 Unspecified asthma with (acute) exacerbation: Secondary | ICD-10-CM | POA: Insufficient documentation

## 2024-05-28 DIAGNOSIS — F17200 Nicotine dependence, unspecified, uncomplicated: Secondary | ICD-10-CM | POA: Diagnosis not present

## 2024-05-28 DIAGNOSIS — R0602 Shortness of breath: Secondary | ICD-10-CM | POA: Diagnosis not present

## 2024-05-28 MED ORDER — PREDNISONE 10 MG PO TABS: 20.0000 mg | ORAL_TABLET | Freq: Every day | ORAL | 0 refills | Status: AC

## 2024-05-28 MED ORDER — PREDNISONE 20 MG PO TABS
40.0000 mg | ORAL_TABLET | Freq: Once | ORAL | Status: AC
  Administered 2024-05-28: 40 mg via ORAL
  Filled 2024-05-28: qty 2

## 2024-05-28 MED ORDER — IPRATROPIUM-ALBUTEROL 0.5-2.5 (3) MG/3ML IN SOLN
3.0000 mL | Freq: Once | RESPIRATORY_TRACT | Status: AC
  Administered 2024-05-28: 3 mL via RESPIRATORY_TRACT
  Filled 2024-05-28 (×2): qty 3

## 2024-05-28 NOTE — ED Triage Notes (Signed)
 Pt. Stated, I had an episode this morning , my inhaler not worked good. I need a breathing treatment.
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
## 2024-05-28 NOTE — Discharge Instructions (Signed)
 You were seen in the emergency department today for chest tightness.  While you were here, we did a chest x-ray, EKG and gave you a breathing treatment along with steroids.  You had improvement of your symptoms with the breathing treatment.  Please use your inhaler, 4 to 6 puffs every 4 hours over the next 24 hours even if you do not feel symptoms.  Please take prednisone , 40 mg a day for the next 4 days.  Come back to the emergency department if you have worsening shortness of breath, chest pain, pass out or if you have any other reason to believe you need emergency care.

## 2024-05-28 NOTE — ED Notes (Signed)
 Pt provided with large ice water and encouraged to hydrate.

## 2024-06-17 NOTE — Progress Notes (Signed)
 Kelly Patton                                          MRN: [id number]   06/17/2024   The [facility] Quality Team Specialist reviewed this patient medical record for the purposes of chart review for care gap closure. The following were reviewed: chart review for care gap closure-breast cancer screening. Last mammo 03/02/2024 out of range.    [facility] Quality Team

## 2024-07-22 ENCOUNTER — Telehealth: Payer: Self-pay | Admitting: *Deleted

## 2024-07-22 ENCOUNTER — Ambulatory Visit: Payer: Self-pay

## 2024-07-22 NOTE — Telephone Encounter (Signed)
 Patient called, no answer, mailbox is full. Unable to reach patient after 3 attempts by [id number] NT, routing to the provider for resolution per protocol.    Copied from CRM [id number]. Topic: Clinical - Red Word Triage >> Jul 22, 2024  3:48 PM Suzen RAMAN wrote: Red Word that prompted transfer to Nurse Triage: elevated BP(Patient advised it was at stroke level without medication) , requesting an immediate refill on olmesartan  (BENICAR ) 20 MG tablet.

## 2024-07-22 NOTE — Telephone Encounter (Signed)
 Copied from CRM [id number]. Topic: Clinical - Prescription Issue >> Jul 21, 2024  4:32 PM Charolett L wrote: Reason for CRM: Patient is requesting a refill for  olmesartan  (BENICAR ) 20 MG tablet. She stated that she was on the medication before and it helped. I advised her that the medication is not on her list of meds and that theres only 5mg  and that Dr. Mercer would have to prescribed it to her in order for her to receive the medication. Patient is requesting a call back

## 2024-07-22 NOTE — Telephone Encounter (Signed)
 Copied from CRM [id number]. Topic: Clinical - Red Word Triage >> Jul 22, 2024  3:48 PM Suzen RAMAN wrote: Red Word that prompted transfer to Nurse Triage: elevated BP(Patient advised it was at stroke level without medication) , requesting an immediate refill on olmesartan  (BENICAR ) 20 MG tablet.

## 2024-07-22 NOTE — Telephone Encounter (Signed)
 This RN attempted to contact patient, no answer, unable to leave vm, vm is full. Will place in call back folder.

## 2024-07-23 NOTE — Telephone Encounter (Signed)
 Can we verify olmesartan  dose with pharmacy.  Unclear if had dose increased by outside provider as also sees Cardiology in [facility].

## 2024-07-27 ENCOUNTER — Other Ambulatory Visit: Payer: Self-pay | Admitting: Family Medicine

## 2024-07-27 DIAGNOSIS — I10 Essential (primary) hypertension: Secondary | ICD-10-CM

## 2024-07-27 NOTE — Telephone Encounter (Unsigned)
 Copied from CRM #[id number]. Topic: Clinical - Medication Refill >> Jul 27, 2024 10:22 AM Kelly Patton wrote: Medication: olmesartan  (BENICAR ) 20 MG tablet   **Patient is also requesting a call from her PCP as she has been without this RX for quite some time ans her BP was elevated last week**    Has the patient contacted their pharmacy? Yes This is the patient's preferred pharmacy:  [facility] [address] [place], [place] - [address] [place] [address] Phone: [phone] Fax: [phone]  Is this the correct pharmacy for this prescription? Yes If no, delete pharmacy and type the correct one.   Has the prescription been filled recently? Yes  Is the patient out of the medication? Yes  Has the patient been seen for an appointment in the last year OR does the patient have an upcoming appointment? Yes  Can we respond through MyChart? Yes  Agent: Please be advised that Rx refills may take up to 3 business days. We ask that you follow-up with your pharmacy.

## 2024-07-28 MED ORDER — OLMESARTAN MEDOXOMIL 5 MG PO TABS: 5.0000 mg | ORAL_TABLET | Freq: Every day | ORAL | 3 refills | Status: AC

## 2024-07-29 NOTE — Telephone Encounter (Signed)
 See other note

## 2024-07-29 NOTE — Telephone Encounter (Signed)
 Medication has been sent to the pharmacy.
